# Patient Record
Sex: Female | Born: 1935 | Race: White | Hispanic: No | Marital: Married | Smoking: Never smoker
Health system: Southern US, Community
[De-identification: ages and names within clinical notes are randomized; demographics above are authoritative.]

## PROBLEM LIST (undated history)

## (undated) DIAGNOSIS — H409 Unspecified glaucoma: Secondary | ICD-10-CM

## (undated) DIAGNOSIS — Z923 Personal history of irradiation: Secondary | ICD-10-CM

## (undated) DIAGNOSIS — K429 Umbilical hernia without obstruction or gangrene: Secondary | ICD-10-CM

## (undated) DIAGNOSIS — E119 Type 2 diabetes mellitus without complications: Secondary | ICD-10-CM

## (undated) DIAGNOSIS — M549 Dorsalgia, unspecified: Secondary | ICD-10-CM

## (undated) DIAGNOSIS — G2581 Restless legs syndrome: Secondary | ICD-10-CM

## (undated) DIAGNOSIS — J343 Hypertrophy of nasal turbinates: Secondary | ICD-10-CM

## (undated) DIAGNOSIS — I471 Supraventricular tachycardia, unspecified: Secondary | ICD-10-CM

## (undated) DIAGNOSIS — R5383 Other fatigue: Secondary | ICD-10-CM

## (undated) DIAGNOSIS — F32A Depression, unspecified: Secondary | ICD-10-CM

## (undated) DIAGNOSIS — K219 Gastro-esophageal reflux disease without esophagitis: Secondary | ICD-10-CM

## (undated) DIAGNOSIS — M1990 Unspecified osteoarthritis, unspecified site: Secondary | ICD-10-CM

## (undated) DIAGNOSIS — E7800 Pure hypercholesterolemia, unspecified: Secondary | ICD-10-CM

## (undated) DIAGNOSIS — T8859XA Other complications of anesthesia, initial encounter: Secondary | ICD-10-CM

## (undated) DIAGNOSIS — I10 Essential (primary) hypertension: Secondary | ICD-10-CM

## (undated) DIAGNOSIS — G5600 Carpal tunnel syndrome, unspecified upper limb: Secondary | ICD-10-CM

## (undated) DIAGNOSIS — E039 Hypothyroidism, unspecified: Secondary | ICD-10-CM

## (undated) DIAGNOSIS — R569 Unspecified convulsions: Secondary | ICD-10-CM

## (undated) DIAGNOSIS — F419 Anxiety disorder, unspecified: Secondary | ICD-10-CM

## (undated) DIAGNOSIS — K5790 Diverticulosis of intestine, part unspecified, without perforation or abscess without bleeding: Secondary | ICD-10-CM

## (undated) DIAGNOSIS — I2510 Atherosclerotic heart disease of native coronary artery without angina pectoris: Secondary | ICD-10-CM

## (undated) DIAGNOSIS — C50919 Malignant neoplasm of unspecified site of unspecified female breast: Secondary | ICD-10-CM

## (undated) DIAGNOSIS — F329 Major depressive disorder, single episode, unspecified: Secondary | ICD-10-CM

## (undated) DIAGNOSIS — T4145XA Adverse effect of unspecified anesthetic, initial encounter: Secondary | ICD-10-CM

## (undated) DIAGNOSIS — Z87442 Personal history of urinary calculi: Secondary | ICD-10-CM

## (undated) HISTORY — PX: TONSILLECTOMY: SUR1361

## (undated) HISTORY — DX: Major depressive disorder, single episode, unspecified: F32.9

## (undated) HISTORY — DX: Other fatigue: R53.83

## (undated) HISTORY — PX: ANGIOPLASTY: SHX39

## (undated) HISTORY — PX: EYE SURGERY: SHX253

## (undated) HISTORY — DX: Malignant neoplasm of unspecified site of unspecified female breast: C50.919

## (undated) HISTORY — PX: CARPAL TUNNEL RELEASE: SHX101

## (undated) HISTORY — PX: MENISCUS REPAIR: SHX5179

## (undated) HISTORY — DX: Depression, unspecified: F32.A

## (undated) HISTORY — DX: Dorsalgia, unspecified: M54.9

## (undated) HISTORY — DX: Unspecified osteoarthritis, unspecified site: M19.90

## (undated) HISTORY — DX: Essential (primary) hypertension: I10

## (undated) HISTORY — PX: PARATHYROIDECTOMY: SHX19

## (undated) HISTORY — PX: ABDOMINAL HYSTERECTOMY: SHX81

## (undated) HISTORY — PX: HERNIA REPAIR: SHX51

## (undated) HISTORY — PX: COLONOSCOPY: SHX174

## (undated) HISTORY — DX: Anxiety disorder, unspecified: F41.9

---
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
## 2000-10-06 DIAGNOSIS — C50919 Malignant neoplasm of unspecified site of unspecified female breast: Secondary | ICD-10-CM | POA: Insufficient documentation

## 2000-10-06 HISTORY — DX: Malignant neoplasm of unspecified site of unspecified female breast: C50.919

## 2000-10-06 HISTORY — PX: BREAST LUMPECTOMY: SHX2

## 2003-04-23 ENCOUNTER — Emergency Department (HOSPITAL_COMMUNITY): Admission: EM | Admit: 2003-04-23 | Discharge: 2003-04-23 | Payer: Self-pay | Admitting: Emergency Medicine

## 2003-04-23 ENCOUNTER — Encounter: Payer: Self-pay | Admitting: Emergency Medicine

## 2003-05-09 ENCOUNTER — Encounter: Admission: RE | Admit: 2003-05-09 | Discharge: 2003-05-09 | Payer: Self-pay | Admitting: Urology

## 2003-05-09 ENCOUNTER — Encounter: Payer: Self-pay | Admitting: Urology

## 2003-05-10 ENCOUNTER — Encounter: Payer: Self-pay | Admitting: Urology

## 2003-05-10 ENCOUNTER — Ambulatory Visit (HOSPITAL_BASED_OUTPATIENT_CLINIC_OR_DEPARTMENT_OTHER): Admission: RE | Admit: 2003-05-10 | Discharge: 2003-05-10 | Payer: Self-pay | Admitting: Urology

## 2003-05-11 ENCOUNTER — Ambulatory Visit (HOSPITAL_BASED_OUTPATIENT_CLINIC_OR_DEPARTMENT_OTHER): Admission: RE | Admit: 2003-05-11 | Discharge: 2003-05-11 | Payer: Self-pay | Admitting: Urology

## 2003-07-18 ENCOUNTER — Encounter: Payer: Self-pay | Admitting: Endocrinology

## 2003-07-18 ENCOUNTER — Encounter: Admission: RE | Admit: 2003-07-18 | Discharge: 2003-07-18 | Payer: Self-pay | Admitting: Endocrinology

## 2003-08-02 ENCOUNTER — Ambulatory Visit (HOSPITAL_COMMUNITY): Admission: RE | Admit: 2003-08-02 | Discharge: 2003-08-02 | Payer: Self-pay | Admitting: Surgery

## 2003-09-04 ENCOUNTER — Encounter: Admission: RE | Admit: 2003-09-04 | Discharge: 2003-09-04 | Payer: Self-pay | Admitting: Endocrinology

## 2003-10-07 DIAGNOSIS — I2510 Atherosclerotic heart disease of native coronary artery without angina pectoris: Secondary | ICD-10-CM

## 2003-10-07 HISTORY — DX: Atherosclerotic heart disease of native coronary artery without angina pectoris: I25.10

## 2003-10-07 HISTORY — PX: CORONARY ANGIOPLASTY: SHX604

## 2003-11-13 ENCOUNTER — Ambulatory Visit (HOSPITAL_COMMUNITY): Admission: RE | Admit: 2003-11-13 | Discharge: 2003-11-14 | Payer: Self-pay | Admitting: Surgery

## 2003-11-13 ENCOUNTER — Encounter (INDEPENDENT_AMBULATORY_CARE_PROVIDER_SITE_OTHER): Payer: Self-pay | Admitting: *Deleted

## 2004-08-27 ENCOUNTER — Inpatient Hospital Stay (HOSPITAL_COMMUNITY): Admission: EM | Admit: 2004-08-27 | Discharge: 2004-08-29 | Payer: Self-pay | Admitting: Emergency Medicine

## 2004-09-18 ENCOUNTER — Encounter: Admission: RE | Admit: 2004-09-18 | Discharge: 2004-09-18 | Payer: Self-pay | Admitting: Internal Medicine

## 2004-10-07 ENCOUNTER — Encounter (HOSPITAL_COMMUNITY): Admission: RE | Admit: 2004-10-07 | Discharge: 2005-01-05 | Payer: Self-pay | Admitting: Cardiology

## 2004-11-06 ENCOUNTER — Ambulatory Visit (HOSPITAL_COMMUNITY): Admission: RE | Admit: 2004-11-06 | Discharge: 2004-11-06 | Payer: Self-pay | Admitting: Cardiology

## 2005-10-21 ENCOUNTER — Encounter: Admission: RE | Admit: 2005-10-21 | Discharge: 2005-10-21 | Payer: Self-pay | Admitting: Internal Medicine

## 2006-11-05 ENCOUNTER — Encounter: Admission: RE | Admit: 2006-11-05 | Discharge: 2006-11-05 | Payer: Self-pay | Admitting: Internal Medicine

## 2006-12-15 ENCOUNTER — Encounter: Admission: RE | Admit: 2006-12-15 | Discharge: 2006-12-15 | Payer: Self-pay | Admitting: Internal Medicine

## 2007-06-03 ENCOUNTER — Encounter: Admission: RE | Admit: 2007-06-03 | Discharge: 2007-06-03 | Payer: Self-pay | Admitting: Cardiology

## 2008-01-04 ENCOUNTER — Encounter: Admission: RE | Admit: 2008-01-04 | Discharge: 2008-01-04 | Payer: Self-pay | Admitting: Internal Medicine

## 2009-01-04 ENCOUNTER — Encounter: Admission: RE | Admit: 2009-01-04 | Discharge: 2009-01-04 | Payer: Self-pay | Admitting: Internal Medicine

## 2010-02-19 ENCOUNTER — Encounter: Admission: RE | Admit: 2010-02-19 | Discharge: 2010-02-19 | Payer: Self-pay | Admitting: Internal Medicine

## 2010-10-26 ENCOUNTER — Encounter: Payer: Self-pay | Admitting: Internal Medicine

## 2010-10-27 ENCOUNTER — Encounter: Payer: Self-pay | Admitting: Family Medicine

## 2010-11-12 ENCOUNTER — Other Ambulatory Visit: Payer: Self-pay | Admitting: Internal Medicine

## 2010-11-12 DIAGNOSIS — N6320 Unspecified lump in the left breast, unspecified quadrant: Secondary | ICD-10-CM

## 2010-11-12 DIAGNOSIS — Z1231 Encounter for screening mammogram for malignant neoplasm of breast: Secondary | ICD-10-CM

## 2010-11-14 ENCOUNTER — Ambulatory Visit
Admission: RE | Admit: 2010-11-14 | Discharge: 2010-11-14 | Disposition: A | Discharge status: Home / Self Care | Source: Ambulatory Visit | Attending: Internal Medicine | Admitting: Internal Medicine

## 2010-11-14 ENCOUNTER — Ambulatory Visit
Admission: RE | Admit: 2010-11-14 | Discharge: 2010-11-14 | Disposition: A | Discharge status: Home / Self Care | Payer: Medicare Other | Source: Ambulatory Visit | Attending: Internal Medicine | Admitting: Internal Medicine

## 2010-11-14 DIAGNOSIS — N6320 Unspecified lump in the left breast, unspecified quadrant: Secondary | ICD-10-CM

## 2010-12-11 ENCOUNTER — Other Ambulatory Visit: Payer: Self-pay | Admitting: Internal Medicine

## 2010-12-11 DIAGNOSIS — Z1231 Encounter for screening mammogram for malignant neoplasm of breast: Secondary | ICD-10-CM

## 2010-12-25 ENCOUNTER — Other Ambulatory Visit: Payer: Self-pay | Admitting: Dermatology

## 2011-02-21 ENCOUNTER — Ambulatory Visit: Payer: Self-pay

## 2011-02-21 ENCOUNTER — Ambulatory Visit

## 2011-02-21 ENCOUNTER — Ambulatory Visit
Admission: RE | Admit: 2011-02-21 | Discharge: 2011-02-21 | Disposition: A | Discharge status: Home / Self Care | Source: Ambulatory Visit | Attending: Internal Medicine | Admitting: Internal Medicine

## 2011-02-21 DIAGNOSIS — Z1231 Encounter for screening mammogram for malignant neoplasm of breast: Secondary | ICD-10-CM

## 2011-02-21 NOTE — Cardiovascular Report (Signed)
NAME:  DANAKA, LLERA NO.:  [id number]   MEDICAL RECORD NO.:  [id number]          PATIENT TYPE:  INP   LOCATION:  6525                         FACILITY:  [facility]   PHYSICIAN:  W. Ashley Royalty., M.D.DATE OF BIRTH:  1931-12-19   DATE OF PROCEDURE:  08/28/2004  DATE OF DISCHARGE:                              CARDIAC CATHETERIZATION   HISTORY:  A 77 year old female was admitted with chest pain consistent with  unstable angina pectoris.  She had a previous history of percutaneous  transluminal coronary angioplasty of the circumflex several years ago and  presented with increasing chest pain and shortness of breath.   PROCEDURE:  Elective left heart catheterization with coronary angiogram,  left ventriculogram, and stent of the proximal and distal left anterior  descending coronary artery.   DESCRIPTION OF PROCEDURE:  The patient was brought to the catheterization  laboratory and was prepped and draped in the usual manner.  After Xylocaine  anesthesia, she had a 6 French sheath placed in the right femoral artery  percutaneously and angiograms were made using 6 French catheters.  A 30 mL  ventriculogram was performed.  She was found to have single-vessel disease  involving the proximal and distal LAD.  The distal LAD was somewhat small.  Consideration was given toward whether referral for stenting or bypass  grafting, but with consultation with other colleagues, decision made to  proceed with stenting of the LAD because of single-vessel nature of disease  and also the intramyocardial lesion of the LAD distally.   The sheath was exchanged for a 7 French sheath and arrangements were made to  do angioplasty.  A second IV was started and IV nitroglycerin was  administered and she was begun on Angiomax with bolus and infusion.  Pepcid  20 mg IV was given.  Plavix 600 mg p.o. was administered.  She then had  angioplasty and stenting done with a 7 [place] JL-4 guiding  catheter, HGF-J  guide wire that crossed both lesions with only mild difficulty.  The  proximal lesion was dilated with 2.5 mm x 12 mm Maverick balloon.  Intracoronary nitroglycerin was then given.  The distal lesion was dilated  with a 2.0 x 8-mm Voyager balloon.  A decision was then made because of the  small size of the distal vessel to stent the distal vessel with 2.0 x 8-mm  Mini-Vision that was deployed with only mild difficulty.  Following the  deployment of the Mini-Vision, she was noted to have a dissection proximal  to the Mini-Vision at the edge.  This did not improve with nitroglycerin.  I  then attempted to pass a 2.5 x 8-mm Taxus stent, but after multiple attempts  this would not advance around the bend and the proximal edge of the stent.  The lesion had been dilated with 2.0 balloon.  At that point, I went in a  2.5-mm balloon and then went in a 2.5 x 8-mm Mini-Vision stent that was  dilated to 11 atmospheres with an excellent result and improvement of the  dissection.  Attention was then given to the proximal vessel which was  stented with a 2.5 x 20-mm Taxus drug-eluting stent inflated to 12  atmospheres.  This resulted in excellent improvement.  She had some nausea  and vomiting following the distal stent, then received anti-emetics and  tolerated this well.  Following this, post dilatation angiograms were  obtained.  Sheaths were sutured in place and she was returned to the  angioplasty care center in stable condition.  Integrilin was also begun  after the presence of the distal dissection.   HEMODYNAMIC DATA:  1.  Aorta postcontrast:  189/93.  2.  LV postcontrast:  189-17-27.   ANGIOGRAPHIC DATA:   LEFT VENTRICULOGRAM:  Performed in the 30 degree RAO projection.  The aortic  valve was normal.  The mitral valve was normal.  The left ventricle appears  normal in size.  Estimated ejection fraction is 65%.  Coronary arteries  arise and distribute normally.  There is  calcification noted in the proximal  left anterior descending and the distal left anterior descending coronary  artery.  Left main coronary artery is normal.   Left anterior descending:  Calcified 30% proximal narrowing.  There is then  a severe 90% stenosis in the mid portion of the vessel after a small  diagonal branch which is diffusely diseased.  There is segmental disease  following that.  There is then an intramyocardial segment.  Following this,  is an 80% stenosis prior to the distal vessel.  There is further segmental  60-70% disease distal to this.   Circumflex coronary artery has two marginal branches and contains only  scattered irregularities.  The previous percutaneous transluminal coronary  angioplasty site is presumed to be widely patent.   Right coronary artery is widely patent.  Post dilatation angiograms of the  left anterior descending show reduction in the proximal stenosis of 90% to  0% with no dissection plane.  The two stents in the previous distal vessel  are overlapping and are well apposed, and there is 0% residual stenosis.  There is some distal residual 60% stenosis in the apex which is segmental  that we opted to leave alone.   IMPRESSION:  1.  Successful stenting of the proximal left anterior descending with a drug-      eluting stent.  2.  Placement of two non-drug-eluting stents in the distal left anterior      descending.  3.  Minimal disease in the other vessels.  4.  Normal left ventricular function.   RECOMMENDATIONS:  She will need intensive risk factor modification as well  as control of hypertension.      Kristine Royal   WST/MEDQ  D:  08/28/2004  T:  08/28/2004  Job:  [id number]   cc:   Gaspar Garbe, M.D.  [address]  [place]  [place] [address]  Fax: [phone]

## 2011-02-21 NOTE — Discharge Summary (Signed)
[name], Roth            ACCOUNT NO.:  [id number]   MEDICAL RECORD NO.:  [id number]          PATIENT TYPE:  INP   LOCATION:  4705                         FACILITY:  [facility]   PHYSICIAN:  Darden Palmer., M.D.DATE OF BIRTH:  [birth year]   DATE OF ADMISSION:  08/27/2004  DATE OF DISCHARGE:  08/29/2004                                 DISCHARGE SUMMARY   FINAL DIAGNOSES:  1.  Subendocardial infarction -- initial episode.  2.  Coronary artery disease.      1.  Status post stenting of the proximal and distal left anterior          descending arteries.  3.  Idiopathic peripheral neuropathy.  4.  History of breast cancer.  5.  History of depression.  6.  Hypertension.  7.  Hyperlipidemia under treatment.   PROCEDURE:  Cardiac catheterization and stenting of the LAD.   HISTORY:  This 77 year old female has a prior history of coronary artery  disease with an angioplasty to the circumflex previously.  She had  parathyroidectomy one year ago and had a negative Cardiolite scan.  She has  had some chest discomfort previously, but did not wish to consider a  catheterization and later stopped taking Plavix.  While on a recent trip to  [place] she developed midsternal and upper epigastric discomfort in  her chest that occurred shortly before going to bed one night.  She had some  dyspnea associated with this and was quite uncomfortable, noticed this was  different than her previous angina, it was not suggestive of indigestion.  She returned home, had discomfort that started yesterday that was vague.  While vacuuming the day of admission she had the onset of similar upper  epigastric and midsternal discomfort associated with sweating as well as  dyspnea and stopped vacuuming.  Discomfort persisted somewhat and was seen  in the office.  EKG showed minor nonspecific changes and she was admitted to  rule out a myocardial infarction.  Please see the previously dictated  history  and physical for remainder of the details.   HOSPITAL COURSE:  On admission sodium is 135, potassium 4.2, renal function  was normal.  Troponin was negative, but MB was elevated at 4.8.  Lipid panel  showed a cholesterol of 204, triglycerides 177, HDL 63, LDL cholesterol 106.  TSH was 2.292.  Followup CPK-MBs were all negative.  EKG showed minor  nonspecific ST-T wave abnormality.   The patient was admitted and started on Lovenox.  She was taken to the  cardiac catheterization laboratory with findings of normal left ventricular  function and there was a calcified segmental stenosis proximal in the left  anterior descending artery estimated at 90%, there was a distal 80% stenosis  and a distal 60% stenosis.  She underwent stenting of the proximal stenosis  with a 2.5 x 20 millimeter Taxus drug-eluting stent and distally with a 2.0  Minivision stent because of the small size of the vessel.  She had a slight  proximal dissection to the distal stent and had a 2.5 millimeter Minivision  stent placed when a Taxus stent  would not negotiate the distal turn.  She  tolerated the procedure well and had Integrilin given postprocedure.  She  was stable and seen by Cardiac Rehab.  She was discharged in improved  condition, but her blood pressure was somewhat elevated and her lipids were  not at goal and she had additional medicines added to her regimen.   CURRENT MEDICATIONS:  1.  Plavix 75 mg daily.  2.  Aspirin 81 mg daily.  3.  Cymbalta 90 mg daily.  4.  Norvasc 5 mg b.i.d.  5.  Toprol XL 50 mg daily.  6.  Vytorin 10/40 mg daily.  7.  Lisinopril 20 mg daily.  8.  Protonix 40 mg daily.  9.  Nitroglycerin 1/150 sublingual as needed.   She is to be involved in a rehab program and I will plan to see her back in  followup in one week and she is to call if there are problems.      Wendy Roth   WST/MEDQ  D:  08/29/2004  T:  08/29/2004  Job:  [id number]

## 2011-02-21 NOTE — Op Note (Signed)
NAME:  Wendy Roth, Wendy Roth                      ACCOUNT NO.:  [id number]   MEDICAL RECORD NO.:  [id number]                   PATIENT TYPE:  OIB   LOCATION:  6709                                 FACILITY:  [facility]   PHYSICIAN:  Velora Heckler, M.D.                DATE OF BIRTH:  [birth year]   DATE OF PROCEDURE:  11/13/2003  DATE OF DISCHARGE:                                 OPERATIVE REPORT   PREOPERATIVE DIAGNOSIS:  Primary hyperparathyroidism.   POSTOPERATIVE DIAGNOSIS:  Primary hyperparathyroidism.   OPERATION PERFORMED:  Minimally invasive parathyroidectomy (right superior  gland).   SURGEON:  Velora Heckler, M.D.   ASSISTANT:  Currie Paris, M.D.   ANESTHESIA:  General.   ANESTHESIOLOGIST:  Judie Petit, M.D.   ESTIMATED BLOOD LOSS:  Minimal.   PREPARATION:  Betadine.   COMPLICATIONS:  None.   INDICATIONS FOR PROCEDURE:  The patient is a 77 year old white female from  [place] who presents with longstanding hypercalcemia.  The patient has  had complications including nephrolithiasis.  The patient's calcium recently  are in excess of eleven.  PTH level is in excess of 200.  Sestamibi scan  demonstrates a right-sided lesion consistent with adenoma.   DESCRIPTION OF PROCEDURE:  The procedure was done in operating room #1  at  the [facility]. [facility].  The patient was brought to the  operating room and placed in supine position on the operating room table.  Following administration of general anesthesia, the patient was positioned  and then prepped and draped in the usual strict aseptic fashion.  After  ascertaining that an adequate level of anesthesia had been obtained, the  neck was scanned with a NeoProbe.  A point in the right midneck was  identified with increased radioactivity.  A 3 cm incision was made with a  #15 blade.  Dissection was carried down through the subcutaneous tissues and  platysma.  Hemostasis was obtained with the  electrocautery.  External  jugular vein was ligated with 3-0 silk ties and divided.  Strap muscles were  incised in the midline and reflected laterally. Thyroid lobe was dissected  out and rolled medially.  Using the NeoProbe for guidance, the right  superior parathyroid gland was identified.  It was abnormal.  It was located  just behind the inferior thyroid artery which was carefully dissected off of  its surface.  Vascular tributaries were divided between small ligaclips.  Entire right superior gland was carefully dissected out and excised  completely.  Inferior gland on the right was identified and preserved as it  appears normal.  The right gland has approximately 90% of background counts  present ex vivo.  It was submitted to pathology where Dr. Kieth Brightly  confirmed parathyroid tissue consistent with parathyroid adenoma. Rescanning  of the neck showed only background levels present.  Good hemostasis was  obtained.  Surgicel was placed behind the right thyroid lobe.  Strap muscles  were reapproximated in the midline with interrupted 3-0 Vicryl sutures.  The  platysma was closed with interrupted 3-0 Vicryl sutures.  Skin edges were  anesthetized with local Marcaine anesthetic.  Skin edges were closed with a  running 4-0 Vicryl subcuticular suture.  The wound was washed and dried and  Steri-Strips were applied.  Sterile dressings were applied.  The patient was  awakened from anesthesia and brought to the recovery room in stable  condition.  The patient tolerated the procedure well.                                               Velora Heckler, M.D.    TMG/MEDQ  D:  11/13/2003  T:  11/14/2003  Job:  [id number]   cc:   Jeannett Senior A. Evlyn Kanner, M.D.  [address]  [place]  [place] [address]  Fax: [phone]   W. Ashley Royalty., M.D.  [address], [address]  [place]  [place] [address]  Fax: [phone]

## 2011-02-21 NOTE — Op Note (Signed)
   NAME:  Wendy Roth, Wendy Roth                      ACCOUNT NO.:  [id number]   MEDICAL RECORD NO.:  [id number]                   PATIENT TYPE:  AMB   LOCATION:  [facility]ESC                                 FACILITY:  [facility]   PHYSICIAN:  Rozanna Boer., M.D.      DATE OF BIRTH:  Sep 27, 1932   DATE OF PROCEDURE:  05/10/2003  DATE OF DISCHARGE:                                 OPERATIVE REPORT   PREOPERATIVE DIAGNOSIS:  Proximal left ureteral stone 7 x 3 mm.   POSTOPERATIVE DIAGNOSIS:  Proximal left ureteral stone 7 x 3 mm.   OPERATION:  Cysto, stone manipulation with retrograde and insertion of  ureteral stent.   ANESTHESIA:  General.   SURGEON:  Courtney Paris, M.D.   BRIEF HISTORY:  This 77 year old patient was found to have a left proximal  ureteral stone on April 21, 2003. There has been no significant movement  since then. She is admitted now for ureteroscopy, possible holmium laser and  removal of a stone if possible. No previous stones.   DESCRIPTION OF PROCEDURE:  The patient was placed on the operating table in  the dorsal lithotomy position and after satisfactory induction of general  anesthesia was prepped and draped with Betadine in the usual sterile  fashion. The bladder was carefully inspected using the 4 oblique lens. No  bladder mucosal lesions were seen. The left orifice was visualized and  catheterized with a 0.38 floppy-tip guidewire and under fluoroscopy the  guidewire was passed up to the stone where it was somewhat tight and in  manipulating the guidewire pass the stone the stone ended up being flushed  back up into the kidney. For that reason, I felt that it would be best not  to do ureteroscopy but rather insert a stent and do lithotripsy tomorrow.  Over the guidewire, I placed a 6 French x 26 cm length double J ureteral  stent and removed the guidewire. The stent was in good position. The  patient's bladder was drained, taken to the recovery area in  good condition  to be later discharged as an outpatient and will be scheduled for  lithotripsy tomorrow.                                               Rozanna Boer., M.D.    HMK/MEDQ  D:  05/10/2003  T:  05/10/2003  Job:  [id number]

## 2012-03-04 ENCOUNTER — Other Ambulatory Visit: Payer: Self-pay | Admitting: Internal Medicine

## 2012-03-04 DIAGNOSIS — Z1231 Encounter for screening mammogram for malignant neoplasm of breast: Secondary | ICD-10-CM

## 2012-04-01 ENCOUNTER — Ambulatory Visit
Admission: RE | Admit: 2012-04-01 | Discharge: 2012-04-01 | Disposition: A | Discharge status: Home / Self Care | Payer: Medicare Other | Source: Ambulatory Visit | Attending: Internal Medicine | Admitting: Internal Medicine

## 2012-04-01 DIAGNOSIS — Z1231 Encounter for screening mammogram for malignant neoplasm of breast: Secondary | ICD-10-CM

## 2012-10-05 ENCOUNTER — Other Ambulatory Visit: Payer: Self-pay | Admitting: Internal Medicine

## 2012-10-05 DIAGNOSIS — N63 Unspecified lump in unspecified breast: Secondary | ICD-10-CM

## 2012-10-06 HISTORY — PX: BREAST LUMPECTOMY: SHX2

## 2012-10-12 ENCOUNTER — Other Ambulatory Visit: Payer: Self-pay | Admitting: Internal Medicine

## 2012-10-12 ENCOUNTER — Ambulatory Visit
Admission: RE | Admit: 2012-10-12 | Discharge: 2012-10-12 | Disposition: A | Discharge status: Home / Self Care | Payer: Medicare Other | Source: Ambulatory Visit | Attending: Internal Medicine | Admitting: Internal Medicine

## 2012-10-12 ENCOUNTER — Ambulatory Visit
Admission: RE | Admit: 2012-10-12 | Discharge: 2012-10-12 | Disposition: A | Discharge status: Home / Self Care | Payer: Medicare Other | Source: Ambulatory Visit

## 2012-10-12 DIAGNOSIS — N63 Unspecified lump in unspecified breast: Secondary | ICD-10-CM

## 2012-10-13 ENCOUNTER — Other Ambulatory Visit: Payer: Self-pay | Admitting: Internal Medicine

## 2012-10-13 DIAGNOSIS — N6489 Other specified disorders of breast: Secondary | ICD-10-CM

## 2012-10-13 DIAGNOSIS — C50911 Malignant neoplasm of unspecified site of right female breast: Secondary | ICD-10-CM

## 2012-10-18 ENCOUNTER — Ambulatory Visit
Admission: RE | Admit: 2012-10-18 | Discharge: 2012-10-18 | Disposition: A | Discharge status: Home / Self Care | Payer: Medicare Other | Source: Ambulatory Visit | Attending: Internal Medicine | Admitting: Internal Medicine

## 2012-10-18 ENCOUNTER — Telehealth: Payer: Self-pay | Admitting: *Deleted

## 2012-10-18 DIAGNOSIS — C50911 Malignant neoplasm of unspecified site of right female breast: Secondary | ICD-10-CM

## 2012-10-18 DIAGNOSIS — N6489 Other specified disorders of breast: Secondary | ICD-10-CM

## 2012-10-18 DIAGNOSIS — C50319 Malignant neoplasm of lower-inner quadrant of unspecified female breast: Secondary | ICD-10-CM

## 2012-10-18 DIAGNOSIS — C50312 Malignant neoplasm of lower-inner quadrant of left female breast: Secondary | ICD-10-CM

## 2012-10-18 HISTORY — DX: Malignant neoplasm of lower-inner quadrant of left female breast: C50.312

## 2012-10-18 MED ORDER — GADOBENATE DIMEGLUMINE 529 MG/ML IV SOLN
8.0000 mL | Freq: Once | INTRAVENOUS | Status: AC | PRN
  Administered 2012-10-18: 8 mL via INTRAVENOUS

## 2012-10-18 NOTE — Telephone Encounter (Signed)
Confirmed [facility] for 10/20/12 at 1230 .  Instructions and contact information given.

## 2012-10-18 NOTE — Telephone Encounter (Signed)
error 

## 2012-10-20 ENCOUNTER — Encounter: Payer: Self-pay | Admitting: *Deleted

## 2012-10-20 ENCOUNTER — Ambulatory Visit (HOSPITAL_BASED_OUTPATIENT_CLINIC_OR_DEPARTMENT_OTHER): Payer: Medicare Other | Admitting: General Surgery

## 2012-10-20 ENCOUNTER — Encounter (INDEPENDENT_AMBULATORY_CARE_PROVIDER_SITE_OTHER): Payer: Self-pay | Admitting: General Surgery

## 2012-10-20 ENCOUNTER — Ambulatory Visit (HOSPITAL_BASED_OUTPATIENT_CLINIC_OR_DEPARTMENT_OTHER): Payer: Medicare Other | Admitting: Oncology

## 2012-10-20 ENCOUNTER — Encounter: Payer: Self-pay | Admitting: Oncology

## 2012-10-20 ENCOUNTER — Other Ambulatory Visit (HOSPITAL_BASED_OUTPATIENT_CLINIC_OR_DEPARTMENT_OTHER): Payer: Medicare Other | Admitting: Lab

## 2012-10-20 ENCOUNTER — Ambulatory Visit: Payer: Medicare Other

## 2012-10-20 ENCOUNTER — Ambulatory Visit
Admission: RE | Admit: 2012-10-20 | Discharge: 2012-10-20 | Disposition: A | Discharge status: Home / Self Care | Payer: Medicare Other | Source: Ambulatory Visit | Attending: Radiation Oncology | Admitting: Radiation Oncology

## 2012-10-20 ENCOUNTER — Ambulatory Visit: Payer: Medicare Other | Attending: General Surgery | Admitting: Physical Therapy

## 2012-10-20 VITALS — BP 162/72 | HR 69 | Temp 97.7°F | Resp 20 | Ht 60.0 in | Wt 167.8 lb

## 2012-10-20 DIAGNOSIS — Z87898 Personal history of other specified conditions: Secondary | ICD-10-CM

## 2012-10-20 DIAGNOSIS — C50319 Malignant neoplasm of lower-inner quadrant of unspecified female breast: Secondary | ICD-10-CM

## 2012-10-20 DIAGNOSIS — Z170 Estrogen receptor positive status [ER+]: Secondary | ICD-10-CM

## 2012-10-20 DIAGNOSIS — IMO0001 Reserved for inherently not codable concepts without codable children: Secondary | ICD-10-CM | POA: Insufficient documentation

## 2012-10-20 DIAGNOSIS — C50919 Malignant neoplasm of unspecified site of unspecified female breast: Secondary | ICD-10-CM | POA: Insufficient documentation

## 2012-10-20 DIAGNOSIS — R293 Abnormal posture: Secondary | ICD-10-CM | POA: Insufficient documentation

## 2012-10-20 LAB — COMPREHENSIVE METABOLIC PANEL (CC13)
AST: 18 U/L (ref 5–34)
BUN: 18 mg/dL (ref 7.0–26.0)
Calcium: 9.3 mg/dL (ref 8.4–10.4)
Chloride: 104 mEq/L (ref 98–107)
Creatinine: 1.1 mg/dL (ref 0.6–1.1)
Glucose: 103 mg/dl — ABNORMAL HIGH (ref 70–99)

## 2012-10-20 LAB — CBC WITH DIFFERENTIAL/PLATELET
Basophils Absolute: 0.1 10*3/uL (ref 0.0–0.1)
EOS%: 3 % (ref 0.0–7.0)
HCT: 42.9 % (ref 34.8–46.6)
HGB: 14.7 g/dL (ref 11.6–15.9)
MCH: 30.8 pg (ref 25.1–34.0)
MCV: 89.9 fL (ref 79.5–101.0)
MONO%: 8 % (ref 0.0–14.0)
NEUT%: 50.3 % (ref 38.4–76.8)
lymph#: 2.5 10*3/uL (ref 0.9–3.3)

## 2012-10-20 MED ORDER — LETROZOLE 2.5 MG PO TABS: 2.5000 mg | ORAL_TABLET | Freq: Every day | ORAL | Status: DC

## 2012-10-20 NOTE — Progress Notes (Signed)
Radiation Oncology         [phone] ________________________________  Initial outpatient Consultation  Name: Wendy Roth MRN: [id number]  Date: 10/20/2012  DOB: 14-Jul-1932   REFERRING PHYSICIAN: Emelia Loron, MD  DIAGNOSIS: cT2N0M0 right breast IDC. Low grade, ER/PR + Her2 (-).  HISTORY OF PRESENT ILLNESS::Wendy Roth is a 79 y.o. female who has a prior history of Left Breast DCIS treated in 2002 in [place] with lumpectomy and Radiation.  No Tamoxifen.  She had a right breast biopsy that was Positive for ADH previously but no excision was done.  Recently she palpated a mass in the right breast.  This was months after a BIRADS 1 mammogram from July 2013.  Another mammogram was done on 10-12-12 demonstrating a 5:00 mass.  Ultrasound was performed, showing an ill-defined hypoechoic area at 5 o'clock 6 cm from the right nipple measuring 1.6 x 1.2 x 1.2 cm. There was overlying skin thickening. Sonography of the right axilla  demonstrates normal lymph nodes.   MRI on 10-18-12  demonstrates a 4.1 cm mass in the Right breast and a 1.4cm lymph node with some effacement of the fatty hilum.  Left breast is negative for signs of malignancy.  She is otherwise in her usual state of health. She does have anxiety and depression and has scheduled therapy with a psychiatrist.       PREVIOUS RADIATION THERAPY: Yes approximately "33 treatments" in [place] to the right breast circa 2002 for DCIS  PAST MEDICAL HISTORY:  has a past medical history of Breast cancer; Hypertension; Arthritis; Back pain; Fatigue; Anxiety; and Depression.    PAST SURGICAL HISTORY: Past Surgical History  Procedure Date  . Tonsillectomy   . Abdominal hysterectomy   . Meniscus repair     left  . Angioplasty     x2  . Carpal tunnel release     bilateral  . Parathyroidectomy   . Breast lumpectomy     left x2    FAMILY HISTORY: family history includes Breast cancer in her maternal aunt.  SOCIAL  HISTORY:  reports that she has never smoked. She does not have any smokeless tobacco history on file. She reports that she drinks alcohol. She reports that she does not use illicit drugs.  ALLERGIES: Review of patient's allergies indicates no known allergies.  MEDICATIONS:  No current outpatient prescriptions on file.    REVIEW OF SYSTEMS:as above  PHYSICAL EXAM:  162/72; 69 pulse; 20 RR; 76 kg General: Alert and oriented, in no acute distress HEENT: Head is normocephalic. Pupils are equally round and reactive to light. Extraocular movements are intact. Oropharynx is clear. Neck: Neck is supple, no palpable cervical or supraclavicular lymphadenopathy. Heart: Regular in rate and rhythm with no murmurs, rubs, or gallops. Chest: Clear to auscultation bilaterally, with no rhonchi, wheezes, or rales. Abdomen: Soft, nontender, nondistended, with no rigidity or guarding. Extremities: No cyanosis or edema. Lymphatics: No concerning lymphadenopathy. Skin: No concerning lesions. Musculoskeletal: symmetric strength and muscle tone throughout. Neurologic: Cranial nerves II through XII are grossly intact. No obvious focalities. Speech is fluent. Coordination is intact. Psychiatric: Judgment and insight are intact. Affect is appropriate. Breasts: right breast is notable for a 5:00 lesion about 3cm clinically with overlying ecchymoses.  No palpable axillary nodes bilaterally. Left breast unremarkable.    LABORATORY DATA:  Lab Results  Component Value Date   WBC 6.7 10/20/2012   HGB 14.7 10/20/2012   HCT 42.9 10/20/2012   MCV 89.9 10/20/2012  PLT 259 10/20/2012   CMP     Component Value Date/Time   NA 141 10/20/2012 1248   K 4.5 10/20/2012 1248   CL 104 10/20/2012 1248   CO2 27 10/20/2012 1248   GLUCOSE 103* 10/20/2012 1248   BUN 18.0 10/20/2012 1248   CREATININE 1.1 10/20/2012 1248   CALCIUM 9.3 10/20/2012 1248   PROT 6.6 10/20/2012 1248   ALBUMIN 3.5 10/20/2012 1248   AST 18 10/20/2012 1248    ALT 18 10/20/2012 1248   ALKPHOS 52 10/20/2012 1248   BILITOT 0.76 10/20/2012 1248         RADIOGRAPHY: US Breast Right  10/12/2012  *RADIOLOGY REPORT*  Clinical Data:  The patient has noted a palpable mass in the right lower inner quadrant with overlying skin changes.  She underwent left lumpectomy and radiation therapy for ductal carcinoma in situ in 2002 in [place].  She also had a stereotactic biopsy of the right breast in 2002 in [place] demonstrating atypical ductal hyperplasia; no surgical excision was done on the right.  DIGITAL DIAGNOSTIC RIGHT MAMMOGRAM WITH CAD AND RIGHT BREAST ULTRASOUND:  Comparison:  04/01/2012, 02/21/2011, 02/19/2010, 01/04/2009, 01/04/2008  Findings:  ACR Breast Density Category 2: There are scattered fibroglandular densities.  There is a vague ill-defined density in the right lower inner quadrant which corresponds to the palpable finding.  A spot tangential view of this area demonstrates skin retraction.  There may be some fine microcalcifications associated with this area.  Mammographic images were processed with CAD.  On physical exam, I palpate a 2 cm mass at 5 o'clock 6 cm from the right nipple.  Ultrasound is performed, showing an ill-defined hypoechoic area at 5 o'clock 6 cm from the right nipple measuring  1.6 x 1.2 x 1.2 cm. There is overlying skin thickening.  Sonography of the right axilla demonstrates normal lymph nodes.  The appearance is suspicious for invasive mammary carcinoma, possibly invasive lobular carcinoma.  Biopsy is recommended. Ultrasound-guided core needle biopsy was discussed with the patient and her husband.  They agreed with this plan.  IMPRESSION: Suspicious ill-defined density in the right lower inner quadrant corresponding to the palpable finding.  RECOMMENDATION: Ultrasound-guided core needle biopsy is suggested.  This will be performed and reported separately.  I have discussed the findings and recommendations with the patient. Results  were also provided in writing at the conclusion of the visit.  BI-RADS CATEGORY 4:  Suspicious abnormality - biopsy should be considered.   Original Report Authenticated By: Cain Saupe, M.D.    Mr Breast Bilateral W Wo Contrast  10/18/2012  *RADIOLOGY REPORT*  Clinical Data: Recently diagnosed right breast invasive ductal carcinoma and ductal carcinoma in situ.  Previous right breast stereotactic guided core needle biopsy in 2002 in [place] demonstrating atypical ductal hyperplasia without subsequent surgical excision.  Status post left lumpectomy and radiation therapy for ductal carcinoma in situ in 2002 in [place].  BUN and creatinine were obtained on site at [facility] at [address] Results:  BUN 19 mg/dL,  Creatinine 1.3 mg/dL.  BILATERAL BREAST MRI WITH AND WITHOUT CONTRAST  Technique: Multiplanar, multisequence MR images of both breasts were obtained prior to and following the intravenous administration of 8ml of MultiHance.  Three dimensional images were evaluated at the independent DynaCad workstation.  Comparison:  Previous examinations, including the recent mammogram, ultrasound and biopsy examinations.  Findings: Mild background nodular parenchymal enhancement in the right breast and mild background parenchymal enhancement without nodularity in the left breast.  Post lumpectomy changes on the left.  4.1 x 2.4 x 1.5 cm irregularly shaped enhancing mass in the posterior aspect of the inferior right breast, slightly laterally. This contains a biopsy marker clip artifact and has a mixture of plateau and persistent enhancement kinetics.  No additional masses or areas of enhancement suspicious for malignancy in either breast.  Mildly prominent right axillary lymph nodes.  The largest is inferiorly and anteriorly located at the level of the nipple, measuring 1.4 x 0.6 cm on image number 72 of the 5-minute postcontrast images.  There is a suggestion of some effacement of the fatty  hilum.  There are no abnormal appearing internal mammary or left axillary lymph nodes.  IMPRESSION:  1.  4.1 x 2.4 x 1.5 cm biopsy-proven invasive ductal carcinoma and ductal carcinoma in situ in the inferior right breast, slightly laterally. 2.  Mildly prominent right axillary lymph nodes, the largest measuring 1.4 cm in maximum diameter.  Since there were no abnormal appearing axillary lymph nodes at recent ultrasound, this can be assessed with sentinel lymph node biopsy or axillary lymph node dissection at the time of surgery unless otherwise clinically indicated. 3.  No evidence of malignancy on the left, status post lumpectomy.  RECOMMENDATION: Treatment plan  THREE-DIMENSIONAL MR IMAGE RENDERING ON INDEPENDENT WORKSTATION:  Three-dimensional MR images were rendered by post-processing of the original MR data on an independent workstation.  The three- dimensional MR images were interpreted, and findings were reported in the accompanying complete MRI report for this study.  BI-RADS CATEGORY 6:  Known biopsy-proven malignancy - appropriate action should be taken.   Original Report Authenticated By: Beckie Salts, M.D.    Mm Digital Diagnostic Unilat L  10/18/2012   *RADIOLOGY REPORT*  Clinical Data:  History of left breast cancer in 2002.  Status post lumpectomy and radiation therapy.  History of stereotactic biopsy of the right breast in 2002 demonstrating atypical ductal hyperplasia.  Recently diagnosed right breast cancer. Pre MRI.  DIGITAL DIAGNOSTIC LEFT MAMMOGRAM WITH CAD  Comparison: None.  Findings:  ACR Breast Density Category 3: The breast tissue is heterogeneously dense.  Lumpectomy changes are seen in the left breast.  There is no new suspicious mass or malignant-type microcalcifications.  Mammographic images were processed with CAD.  IMPRESSION: No evidence of malignancy in the left breast.  RECOMMENDATION: Treatment planning of the right breast is recommended.  I have discussed the findings and  recommendations with the patient. Results were also provided in writing at the conclusion of the visit.  BI-RADS CATEGORY 2:  Benign finding(s).   Original Report Authenticated By: Baird Lyons, M.D.    Mm Digital Diagnostic Unilat R  10/12/2012  *RADIOLOGY REPORT*  Clinical Data:  Ultrasound-guided core needle biopsy of a vague hypoechoic density at 5 o'clock 6 cm from the right nipple with clip placement.  DIGITAL DIAGNOSTIC RIGHT MAMMOGRAM  Comparison:  Previous exams.  Findings:  Films are performed following ultrasound guided biopsy of a vague density at 5 o'clock 6 cm from the right nipple.  The ribbon clip is appropriately positioned within the area of density.  IMPRESSION: Appropriate clip placement following ultrasound-guided core needle biopsy of a vague density at 5 o'clock 6 cm from the right nipple.   Original Report Authenticated By: Cain Saupe, M.D.    Mm Digital Diagnostic Unilat R  10/12/2012  *RADIOLOGY REPORT*  Clinical Data:  The patient has noted a palpable mass in the right lower inner quadrant with overlying skin changes.  She  underwent left lumpectomy and radiation therapy for ductal carcinoma in situ in 2002 in [place].  She also had a stereotactic biopsy of the right breast in 2002 in [place] demonstrating atypical ductal hyperplasia; no surgical excision was done on the right.  DIGITAL DIAGNOSTIC RIGHT MAMMOGRAM WITH CAD AND RIGHT BREAST ULTRASOUND:  Comparison:  04/01/2012, 02/21/2011, 02/19/2010, 01/04/2009, 01/04/2008  Findings:  ACR Breast Density Category 2: There are scattered fibroglandular densities.  There is a vague ill-defined density in the right lower inner quadrant which corresponds to the palpable finding.  A spot tangential view of this area demonstrates skin retraction.  There may be some fine microcalcifications associated with this area.  Mammographic images were processed with CAD.  On physical exam, I palpate a 2 cm mass at 5 o'clock 6 cm from the right  nipple.  Ultrasound is performed, showing an ill-defined hypoechoic area at 5 o'clock 6 cm from the right nipple measuring  1.6 x 1.2 x 1.2 cm. There is overlying skin thickening.  Sonography of the right axilla demonstrates normal lymph nodes.  The appearance is suspicious for invasive mammary carcinoma, possibly invasive lobular carcinoma.  Biopsy is recommended. Ultrasound-guided core needle biopsy was discussed with the patient and her husband.  They agreed with this plan.  IMPRESSION: Suspicious ill-defined density in the right lower inner quadrant corresponding to the palpable finding.  RECOMMENDATION: Ultrasound-guided core needle biopsy is suggested.  This will be performed and reported separately.  I have discussed the findings and recommendations with the patient. Results were also provided in writing at the conclusion of the visit.  BI-RADS CATEGORY 4:  Suspicious abnormality - biopsy should be considered.   Original Report Authenticated By: Cain Saupe, M.D.    US Breast Bx W Loc Dev 1st Lesion Img Bx Spec US Guide  10/13/2012  *RADIOLOGY REPORT*  Clinical Data:  Palpable mass in the right lower inner quadrant with a vague density on the ultrasound and mammogram.  ULTRASOUND GUIDED VACUUM ASSISTED CORE BIOPSY OF THE RIGHT BREAST  The patient and I discussed the procedure of ultrasound-guided biopsy, including benefits and alternatives.  We discussed the high likelihood of a successful procedure. We discussed the risks of the procedure including infection, bleeding, tissue injury, clip migration, and inadequate sampling.  Informed written consent was given.  Using sterile technique, 2% lidocaine, ultrasound guidance, and a 12 gauge vacuum assisted needle, biopsy was performed of the vague density at 5 o'clock 6 cm from the right nipple using a mediolateral approach.  At the conclusion of the procedure, a ribbon tissue marker clip was deployed into the biopsy cavity. Follow-up 2-view mammogram was  performed and dictated separately.  Histologic evaluation demonstrates invasive ductal carcinoma and ductal carcinoma in situ, likely grade I.  This is concordant with the imaging findings.  Results were discussed with the patient and her husband by telephone at her request.  She reports no complications from the procedure.  The patient is scheduled to be evaluated in the [facility] on 10/20/2012.  She is scheduled for breast MRI on 10/18/2012.  IMPRESSION: Ultrasound-guided biopsy of a vague density at 5 o'clock 6 cm from the right nipple. Invasive ductal carcinoma and ductal carcinoma in situ is diagnosed.  The patient is scheduled for breast MRI and [facility].  No apparent complications.   Original Report Authenticated By: Cain Saupe, M.D.       IMPRESSION/PLAN: 79 year old woman with cT2N0 right breast cancer, prior  DCIS treated in the left breast.  She will talk to med/onc and surgery about possible anti-estrogen therapy and lumpectomy /SLN bx.  If she preserves her breast, she will need radiotherapy to that side. I will appreciate seeing her postoperatively to discuss treatment planning.   She is also considering mastectomy.  If she has a mastectomy, indications for RT would be positive margins or positive node(s).   I have encouraged her to call if she has any issues or concerns in the future. I wished her the very best.  I spent 30 minutes minutes face to face with the patient and more than 50% of that time was spent in counseling and/or coordination of care.    __________________________________________   Lonie Peak, MD

## 2012-10-20 NOTE — Progress Notes (Signed)
SHARECE FLEISCHHACKER [id number] Feb 05, 1932 79 y.o. 10/20/2012 4:18 PM  CC Dr. Renford Dills  REASON FOR CONSULTATION:  79 year old female who self palpated a mass in her right breast. 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 Patient was seen in the Multidisciplinary Breast Clinic for discussion of her treatment options.   STAGE:   Cancer of lower-inner quadrant of female breast   Primary site: Breast (Right)   Staging method: AJCC 7th Edition   Clinical: Stage IIA (T2, N0, cM0)   Summary: Stage IIA (T2, N0, cM0)  REFERRING PHYSICIAN: Dr. Emelia Loron  HISTORY OF PRESENT ILLNESS:  PEARLEY MILLINGTON is a 79 y.o. female.  With medical history significant for hypertension arthritis fatigue anxiety and depression. Recently on self breast examination patient found a mass in the right breast. Because of this she underwent a mammogram ultrasound and eventually a biopsy. The biopsy showed a grade 1 invasive ductal carcinoma with DCIS. The tumor was ER +100% PR +100% HER-2/neu negative with a proliferation marker Ki-67 low at 15%. Patient went on to then have MRI of the breasts performed that showed a 4.1 cm area of enhancement. No other abnormalities were seen. Patient is now seen in the multidisciplinary breast clinic for discussion of treatment options. Her case was discussed in the multidisciplinary breast conference. All of her pathology and radiology were reviewed. She is without any acute or new complaints.   Past Medical History: Past Medical History  Diagnosis Date  . Breast cancer   . Hypertension   . Arthritis   . Back pain   . Fatigue   . Anxiety   . Depression     Past Surgical History: Past Surgical History  Procedure Date  . Tonsillectomy   . Abdominal hysterectomy   . Meniscus repair     left  . Angioplasty     x2  . Carpal tunnel release     bilateral  . Parathyroidectomy     . Breast lumpectomy     left x2    Family History: Family History  Problem Relation Age of Onset  . Breast cancer Maternal Aunt     Social History History  Substance Use Topics  . Smoking status: Never Smoker   . Smokeless tobacco: Not on file  . Alcohol Use: Yes    Allergies: No Known Allergies  Current Medications: Current Outpatient Prescriptions  Medication Sig Dispense Refill  . amLODipine (NORVASC) 5 MG tablet Take 5 mg by mouth daily.      Marland Kitchen aspirin 81 MG tablet Take 81 mg by mouth daily.      . clopidogrel (PLAVIX) 75 MG tablet Take 75 mg by mouth daily.      . DULoxetine (CYMBALTA) 30 MG capsule Take 30 mg by mouth daily.      Marland Kitchen ezetimibe-simvastatin (VYTORIN) 10-20 MG per tablet Take 1 tablet by mouth at bedtime.      Marland Kitchen levothyroxine (SYNTHROID, LEVOTHROID) 25 MCG tablet Take 25 mcg by mouth daily.      Marland Kitchen lisinopril (PRINIVIL,ZESTRIL) 20 MG tablet Take 20 mg by mouth daily.      . metoprolol succinate (TOPROL-XL) 50 MG 24 hr tablet Take 50 mg by mouth daily. Take with or immediately following a meal.      . temazepam (RESTORIL) 15 MG capsule Take 15 mg by mouth at bedtime as needed.        OB/GYN History:patient had menarche  at age 26 she underwent menopause in 66 do to her hysterectomy. She has been on hormone replacement therapy for about 15 years she discontinued them and 2000 to do to diagnosis of breast cancer. Patient has had 2 live births first live birth was at 56.  Fertility Discussion: not applicable Prior History of Cancer: patient does have a prior history of left breast DCIS treated in 2002 in [place] with lumpectomy and radiation therapy but no tamoxifen. She has  also has had right breast biopsies that were positive for ADH previously but no excision was done.  Health Maintenance:  Colonoscopy yes Bone Density yes Last PAP smear 2004  ECOG PERFORMANCE STATUS: 1 - Symptomatic but completely ambulatory  Genetic Counseling/testing: with  prior history of breast cancer as well as ADH patient will be referred to genetic counseling or  REVIEW OF SYSTEMS:  A comprehensive review of systems was negative.  PHYSICAL EXAMINATION: Blood pressure 162/72, pulse 69, temperature 97.7 F (36.5 C), resp. rate 20, height 5' (1.524 m), weight 167 lb 12.8 oz (76.114 kg).  WUJ:WJXBJ, healthy, no distress, well nourished and well developed SKIN: skin color, texture, turgor are normal HEAD: Normocephalic EYES: PERRLA, EOMI EARS: External ears normal OROPHARYNX:no exudate, no erythema and lips, buccal mucosa, and tongue normal  NECK: supple, no adenopathy LYMPH:  no palpable lymphadenopathy BREAST:left breast normal without mass, skin or nipple changes or axillary nodes, right breast reveals a palpable mass without any other dominant masses no nipple discharge no skin changes LUNGS: clear to auscultation and percussion HEART: regular rate & rhythm ABDOMEN:abdomen soft, non-tender, obese, normal bowel sounds and no masses or organomegaly BACK: No CVA tenderness EXTREMITIES:no edema, no clubbing, no cyanosis  NEURO: alert & oriented x 3 with fluent speech, no focal motor/sensory deficits, gait normal, reflexes normal and symmetric     STUDIES/RESULTS: US Breast Right  10/12/2012  *RADIOLOGY REPORT*  Clinical Data:  The patient has noted a palpable mass in the right lower inner quadrant with overlying skin changes.  She underwent left lumpectomy and radiation therapy for ductal carcinoma in situ in 2002 in [place].  She also had a stereotactic biopsy of the right breast in 2002 in [place] demonstrating atypical ductal hyperplasia; no surgical excision was done on the right.  DIGITAL DIAGNOSTIC RIGHT MAMMOGRAM WITH CAD AND RIGHT BREAST ULTRASOUND:  Comparison:  04/01/2012, 02/21/2011, 02/19/2010, 01/04/2009, 01/04/2008  Findings:  ACR Breast Density Category 2: There are scattered fibroglandular densities.  There is a vague ill-defined  density in the right lower inner quadrant which corresponds to the palpable finding.  A spot tangential view of this area demonstrates skin retraction.  There may be some fine microcalcifications associated with this area.  Mammographic images were processed with CAD.  On physical exam, I palpate a 2 cm mass at 5 o'clock 6 cm from the right nipple.  Ultrasound is performed, showing an ill-defined hypoechoic area at 5 o'clock 6 cm from the right nipple measuring  1.6 x 1.2 x 1.2 cm. There is overlying skin thickening.  Sonography of the right axilla demonstrates normal lymph nodes.  The appearance is suspicious for invasive mammary carcinoma, possibly invasive lobular carcinoma.  Biopsy is recommended. Ultrasound-guided core needle biopsy was discussed with the patient and her husband.  They agreed with this plan.  IMPRESSION: Suspicious ill-defined density in the right lower inner quadrant corresponding to the palpable finding.  RECOMMENDATION: Ultrasound-guided core needle biopsy is suggested.  This will be performed and reported separately.  I  have discussed the findings and recommendations with the patient. Results were also provided in writing at the conclusion of the visit.  BI-RADS CATEGORY 4:  Suspicious abnormality - biopsy should be considered.   Original Report Authenticated By: Cain Saupe, M.D.    Mr Breast Bilateral W Wo Contrast  10/18/2012  *RADIOLOGY REPORT*  Clinical Data: Recently diagnosed right breast invasive ductal carcinoma and ductal carcinoma in situ.  Previous right breast stereotactic guided core needle biopsy in 2002 in [place] demonstrating atypical ductal hyperplasia without subsequent surgical excision.  Status post left lumpectomy and radiation therapy for ductal carcinoma in situ in 2002 in [place].  BUN and creatinine were obtained on site at [facility] at [address] Results:  BUN 19 mg/dL,  Creatinine 1.3 mg/dL.  BILATERAL BREAST MRI WITH AND WITHOUT  CONTRAST  Technique: Multiplanar, multisequence MR images of both breasts were obtained prior to and following the intravenous administration of 8ml of MultiHance.  Three dimensional images were evaluated at the independent DynaCad workstation.  Comparison:  Previous examinations, including the recent mammogram, ultrasound and biopsy examinations.  Findings: Mild background nodular parenchymal enhancement in the right breast and mild background parenchymal enhancement without nodularity in the left breast.  Post lumpectomy changes on the left.  4.1 x 2.4 x 1.5 cm irregularly shaped enhancing mass in the posterior aspect of the inferior right breast, slightly laterally. This contains a biopsy marker clip artifact and has a mixture of plateau and persistent enhancement kinetics.  No additional masses or areas of enhancement suspicious for malignancy in either breast.  Mildly prominent right axillary lymph nodes.  The largest is inferiorly and anteriorly located at the level of the nipple, measuring 1.4 x 0.6 cm on image number 72 of the 5-minute postcontrast images.  There is a suggestion of some effacement of the fatty hilum.  There are no abnormal appearing internal mammary or left axillary lymph nodes.  IMPRESSION:  1.  4.1 x 2.4 x 1.5 cm biopsy-proven invasive ductal carcinoma and ductal carcinoma in situ in the inferior right breast, slightly laterally. 2.  Mildly prominent right axillary lymph nodes, the largest measuring 1.4 cm in maximum diameter.  Since there were no abnormal appearing axillary lymph nodes at recent ultrasound, this can be assessed with sentinel lymph node biopsy or axillary lymph node dissection at the time of surgery unless otherwise clinically indicated. 3.  No evidence of malignancy on the left, status post lumpectomy.  RECOMMENDATION: Treatment plan  THREE-DIMENSIONAL MR IMAGE RENDERING ON INDEPENDENT WORKSTATION:  Three-dimensional MR images were rendered by post-processing of the  original MR data on an independent workstation.  The three- dimensional MR images were interpreted, and findings were reported in the accompanying complete MRI report for this study.  BI-RADS CATEGORY 6:  Known biopsy-proven malignancy - appropriate action should be taken.   Original Report Authenticated By: Beckie Salts, M.D.    Mm Digital Diagnostic Unilat L  10/18/2012   *RADIOLOGY REPORT*  Clinical Data:  History of left breast cancer in 2002.  Status post lumpectomy and radiation therapy.  History of stereotactic biopsy of the right breast in 2002 demonstrating atypical ductal hyperplasia.  Recently diagnosed right breast cancer. Pre MRI.  DIGITAL DIAGNOSTIC LEFT MAMMOGRAM WITH CAD  Comparison: None.  Findings:  ACR Breast Density Category 3: The breast tissue is heterogeneously dense.  Lumpectomy changes are seen in the left breast.  There is no new suspicious mass or malignant-type microcalcifications.  Mammographic images were processed with  CAD.  IMPRESSION: No evidence of malignancy in the left breast.  RECOMMENDATION: Treatment planning of the right breast is recommended.  I have discussed the findings and recommendations with the patient. Results were also provided in writing at the conclusion of the visit.  BI-RADS CATEGORY 2:  Benign finding(s).   Original Report Authenticated By: Baird Lyons, M.D.    Mm Digital Diagnostic Unilat R  10/12/2012  *RADIOLOGY REPORT*  Clinical Data:  Ultrasound-guided core needle biopsy of a vague hypoechoic density at 5 o'clock 6 cm from the right nipple with clip placement.  DIGITAL DIAGNOSTIC RIGHT MAMMOGRAM  Comparison:  Previous exams.  Findings:  Films are performed following ultrasound guided biopsy of a vague density at 5 o'clock 6 cm from the right nipple.  The ribbon clip is appropriately positioned within the area of density.  IMPRESSION: Appropriate clip placement following ultrasound-guided core needle biopsy of a vague density at 5 o'clock 6 cm from the  right nipple.   Original Report Authenticated By: Cain Saupe, M.D.    Mm Digital Diagnostic Unilat R  10/12/2012  *RADIOLOGY REPORT*  Clinical Data:  The patient has noted a palpable mass in the right lower inner quadrant with overlying skin changes.  She underwent left lumpectomy and radiation therapy for ductal carcinoma in situ in 2002 in [place].  She also had a stereotactic biopsy of the right breast in 2002 in [place] demonstrating atypical ductal hyperplasia; no surgical excision was done on the right.  DIGITAL DIAGNOSTIC RIGHT MAMMOGRAM WITH CAD AND RIGHT BREAST ULTRASOUND:  Comparison:  04/01/2012, 02/21/2011, 02/19/2010, 01/04/2009, 01/04/2008  Findings:  ACR Breast Density Category 2: There are scattered fibroglandular densities.  There is a vague ill-defined density in the right lower inner quadrant which corresponds to the palpable finding.  A spot tangential view of this area demonstrates skin retraction.  There may be some fine microcalcifications associated with this area.  Mammographic images were processed with CAD.  On physical exam, I palpate a 2 cm mass at 5 o'clock 6 cm from the right nipple.  Ultrasound is performed, showing an ill-defined hypoechoic area at 5 o'clock 6 cm from the right nipple measuring  1.6 x 1.2 x 1.2 cm. There is overlying skin thickening.  Sonography of the right axilla demonstrates normal lymph nodes.  The appearance is suspicious for invasive mammary carcinoma, possibly invasive lobular carcinoma.  Biopsy is recommended. Ultrasound-guided core needle biopsy was discussed with the patient and her husband.  They agreed with this plan.  IMPRESSION: Suspicious ill-defined density in the right lower inner quadrant corresponding to the palpable finding.  RECOMMENDATION: Ultrasound-guided core needle biopsy is suggested.  This will be performed and reported separately.  I have discussed the findings and recommendations with the patient. Results were also provided  in writing at the conclusion of the visit.  BI-RADS CATEGORY 4:  Suspicious abnormality - biopsy should be considered.   Original Report Authenticated By: Cain Saupe, M.D.    US Breast Bx W Loc Dev 1st Lesion Img Bx Spec US Guide  10/13/2012  *RADIOLOGY REPORT*  Clinical Data:  Palpable mass in the right lower inner quadrant with a vague density on the ultrasound and mammogram.  ULTRASOUND GUIDED VACUUM ASSISTED CORE BIOPSY OF THE RIGHT BREAST  The patient and I discussed the procedure of ultrasound-guided biopsy, including benefits and alternatives.  We discussed the high likelihood of a successful procedure. We discussed the risks of the procedure including infection, bleeding, tissue injury, clip migration, and inadequate  sampling.  Informed written consent was given.  Using sterile technique, 2% lidocaine, ultrasound guidance, and a 12 gauge vacuum assisted needle, biopsy was performed of the vague density at 5 o'clock 6 cm from the right nipple using a mediolateral approach.  At the conclusion of the procedure, a ribbon tissue marker clip was deployed into the biopsy cavity. Follow-up 2-view mammogram was performed and dictated separately.  Histologic evaluation demonstrates invasive ductal carcinoma and ductal carcinoma in situ, likely grade I.  This is concordant with the imaging findings.  Results were discussed with the patient and her husband by telephone at her request.  She reports no complications from the procedure.  The patient is scheduled to be evaluated in the [facility] on 10/20/2012.  She is scheduled for breast MRI on 10/18/2012.  IMPRESSION: Ultrasound-guided biopsy of a vague density at 5 o'clock 6 cm from the right nipple. Invasive ductal carcinoma and ductal carcinoma in situ is diagnosed.  The patient is scheduled for breast MRI and [facility].  No apparent complications.   Original Report Authenticated By:  Cain Saupe, M.D.      LABS:    Chemistry      Component Value Date/Time   NA 141 10/20/2012 1248   K 4.5 10/20/2012 1248   CL 104 10/20/2012 1248   CO2 27 10/20/2012 1248   BUN 18.0 10/20/2012 1248   CREATININE 1.1 10/20/2012 1248      Component Value Date/Time   CALCIUM 9.3 10/20/2012 1248   ALKPHOS 52 10/20/2012 1248   AST 18 10/20/2012 1248   ALT 18 10/20/2012 1248   BILITOT 0.76 10/20/2012 1248      Lab Results  Component Value Date   WBC 6.7 10/20/2012   HGB 14.7 10/20/2012   HCT 42.9 10/20/2012   MCV 89.9 10/20/2012   PLT 259 10/20/2012   PATHOLOGY: PROGNOSTIC INDICATORS - ACIS Results IMMUNOHISTOCHEMICAL AND MORPHOMETRIC ANALYSIS BY THE AUTOMATED CELLULAR IMAGING SYSTEM (ACIS) Estrogen Receptor (Negative, <1%): 100%, STRONG STAINING INTENSITY Progesterone Receptor (Negative, <1%): 100%, STRONG STAINING INTENSITY Proliferation Marker Ki67 by M IB-1 (Low<20%): 15% All controls stained appropriately Jimmy Picket MD Pathologist, Electronic Signature ( Signed 10/15/2012) CHROMOGENIC IN-SITU HYBRIDIZATION Interpretation HER-2/NEU BY CISH - NO AMPLIFICATION OF HER-2 DETECTED. THE RATIO OF HER-2: CEP 17 SIGNALS WAS 1.13. Reference range: Ratio: HER2:CEP17 < 1.8 - gene amplification not observed Ratio: HER2:CEP 17 1.8-2.2 - equivocal result Ratio: HER2:CEP17 > 2.2 - gene amplification observed Jimmy Picket MD Pathologist, Electronic Signature ( Signed 10/15/2012) 1 of 2 FINAL for SACOYA, MCGOURTY ([id number]) FINAL DIAGNOSIS Diagnosis Breast, right, needle core biopsy, 5 o'clock, 6 cm / nipple - INVASIVE DUCTAL CARCINOMA, SEE COMMENT. - DUCTAL CARCINOMA IN SITU. Microscopic Comment Although the grade of tumor is best assessed at resection, in these biopsies both in situ and invasive carcinoma are Grade I. Breast prognostic studies are pending and will be reported in an Addendum. The case was reviewed with Dr. Raynald Blend, who concurs. (CRR:caf 10/13/12) [place] RUND  DO Pathologist, Electronic Signature (Case ASSESSMENT    79 year old female with  #1 prior history of left breast carcinoma which was a DCIS treated with a lumpectomy and radiation therapy in 2002 but no known tamoxifen. She's also had a right breast biopsy that was positive for atypical ductal hyperplasia but no excision was done. Patient now with new diagnosis of invasive mammary carcinoma on ultrasound showing an ill-defined hypoechoic area at the 5:00 position 6 cm from the  right nipple measuring 1.6 x 1.2 x 1.2 cm width overlying skin thickening. Sonography did reveal normal appearing right axillary lymph nodes. MRI on 10/18/2012 demonstrated a 4.1 cm mass in the right breast and a 1.4 cm lymph node with effacement of the fatty hilum. The left breast was negative. Patient is seen in the multidisciplinary breast clinic for discussion of treatment options. Patient desires breast conservation.  #2 we discussed neoadjuvant approach with antiestrogen therapy. We discussed use of letrozole 2.5 mg on a daily basis for about 6-9 months. We discussed the rationale for this we discussed risks and benefits of that. I would plan on getting MRIs of the breast Center 3 months and 6 months to evaluate response to therapy. Patient and her husband understood this.  #3 once we have maximal response then she will proceed with surgery and Dr. Doreen Salvage office will contact her eventually to  Clinical Trial Eligibility: no  Multidisciplinary conference discussion yes    PLAN:    #1 proceed with neoadjuvant antiestrogen therapy with letrozole 2.5 mg on a daily basis. Prescription was sent to her pharmacy.  #2 patient will return in 3 months time I will do a physical examination and based on this we may get her scheduled for an MRI. I certainly will definitely be doing MRI at 6 months.  #3 she will be seen back in 3 months time for followup.       Discussion: Patient is being treated per NCCN breast  cancer care guidelines appropriate for stage.II   Thank you so much for allowing me to participate in the care of Jeralene Peters. I will continue to follow up the patient with you and assist in her care.  All questions were answered. The patient knows to call the clinic with any problems, questions or concerns. We can certainly see the patient much sooner if necessary.  I spent 55 minutes counseling the patient face to face. The total time spent in the appointment was 60 minutes.  Drue Second, MD Medical/Oncology [facility] [phone] (beeper) [phone] (Office)  10/20/2012, 4:18 PM

## 2012-10-20 NOTE — Patient Instructions (Addendum)
Proceed with neoadjuvant anti-estrogen therapy  Letrozole tablets What is this medicine? LETROZOLE (LET roe zole) blocks the production of estrogen. Certain types of breast cancer grow under the influence of estrogen. Letrozole helps block tumor growth. This medicine is used to treat advanced breast cancer in postmenopausal women. This medicine may be used for other purposes; ask your health care provider or pharmacist if you have questions. What should I tell my health care provider before I take this medicine? They need to know if you have any of these conditions: -liver disease -osteoporosis (weak bones) -an unusual or allergic reaction to letrozole, other medicines, foods, dyes, or preservatives -pregnant or trying to get pregnant -breast-feeding How should I use this medicine? Take this medicine by mouth with a glass of water. You may take it with or without food. Follow the directions on the prescription label. Take your medicine at regular intervals. Do not take your medicine more often than directed. Do not stop taking except on your doctor's advice. Talk to your pediatrician regarding the use of this medicine in children. Special care may be needed. Overdosage: If you think you have taken too much of this medicine contact a poison control center or emergency room at once. NOTE: This medicine is only for you. Do not share this medicine with others. What if I miss a dose? If you miss a dose, take it as soon as you can. If it is almost time for your next dose, take only that dose. Do not take double or extra doses. What may interact with this medicine? Do not take this medicine with any of the following medications: -estrogens, like hormone replacement therapy or birth control pills This medicine may also interact with the following medications: -dietary supplements such as androstenedione or DHEA -prasterone -tamoxifen This list may not describe all possible interactions. Give your  health care provider a list of all the medicines, herbs, non-prescription drugs, or dietary supplements you use. Also tell them if you smoke, drink alcohol, or use illegal drugs. Some items may interact with your medicine. What should I watch for while using this medicine? Visit your doctor or health care professional for regular check-ups to monitor your condition. Do not use this drug if you are pregnant. Serious side effects to an unborn child are possible. Talk to your doctor or pharmacist for more information. You may get drowsy or dizzy. Do not drive, use machinery, or do anything that needs mental alertness until you know how this medicine affects you. Do not stand or sit up quickly, especially if you are an older patient. This reduces the risk of dizzy or fainting spells. What side effects may I notice from receiving this medicine? Side effects that you should report to your doctor or health care professional as soon as possible: -allergic reactions like skin rash, itching, or hives -bone fracture -chest pain -difficulty breathing or shortness of breath -severe pain, swelling, warmth in the leg -unusually weak or tired -vaginal bleeding Side effects that usually do not require medical attention (report to your doctor or health care professional if they continue or are bothersome): -bone, back, joint, or muscle pain -dizziness -fatigue -fluid retention -headache -hot flashes, night sweats -nausea -weight gain This list may not describe all possible side effects. Call your doctor for medical advice about side effects. You may report side effects to FDA at 1-800-FDA-1088. Where should I keep my medicine? Keep out of the reach of children. Store between 15 and 30 degrees C (59  and 86 degrees F). Throw away any unused medicine after the expiration date. NOTE: This sheet is a summary. It may not cover all possible information. If you have questions about this medicine, talk to your  doctor, pharmacist, or health care provider.  2013, [employer]/Gold Standard. (12/03/2007 4:43:44 PM)

## 2012-10-20 NOTE — Progress Notes (Signed)
Checked in new patient. No financial issues. °

## 2012-10-21 ENCOUNTER — Encounter: Payer: Self-pay | Admitting: *Deleted

## 2012-10-21 ENCOUNTER — Encounter: Payer: Self-pay | Admitting: Specialist

## 2012-10-21 ENCOUNTER — Telehealth: Payer: Self-pay | Admitting: *Deleted

## 2012-10-21 ENCOUNTER — Encounter (INDEPENDENT_AMBULATORY_CARE_PROVIDER_SITE_OTHER): Payer: Self-pay | Admitting: General Surgery

## 2012-10-21 NOTE — Progress Notes (Signed)
Patient ID: Wendy Roth, female   DOB: 1932-04-17, 79 y.o.   MRN: [id number]  Chief Complaint  Patient presents with  . breast cancer    HPI [name] is a 79 y.o. female.  Referred by Dr. Pete Glatter HPI This is a 79 year old female who on her own self exam palpated a right breast mass and her skin being different above that. 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 She has done well until New Year's Eve when she noted his right breast mass. She states she has no nipple discharge tenderness or any other complaints. She has been on Plavix and she had a stent placed in 2005 and she follow Dr. Donnie Aho. She underwent a mammogram in June which was negative. She then felt a mass and underwent a diagnostic right mammogram as well as ultrasound. The mammogram showed a vague ill-defined density in the right lower inner quadrant. This showed skin retraction as well as possible microcalcifications were also in the area. Ultrasound showed an ill-defined hypoechoic area measuring 1.6 x 1.2 x 1.2 cm with some overlying skin thickening. Sonography of the right axilla demonstrates normal lymph nodes. She has also now undergone an MRI which shows a 4.1 x 2.4 x 1.5 cm mass in the inferior right breast. She has a mildly prominent nodes but these are normal on her ultrasound. There no abnormal appearing internal mammary left axillary nodes there is no evidence of malignancy on the left status post lumpectomy. The lesion underwent biopsy and this is shown to be an invasive ductal carcinoma with ductal carcinoma in situ. They appear to be grade 1. Estrogen and progesterone receptors are both positive at 100%. HER-2/neu is not amplified. Her proliferation markers 15%. She comes in today to discuss her  options.  Past Medical History  Diagnosis Date  . Breast cancer   . Hypertension   . Arthritis   . Back pain   . Fatigue   . Anxiety   . Depression     Past Surgical History  Procedure Date  . Tonsillectomy   . Abdominal hysterectomy   . Meniscus repair     left  . Angioplasty     x2  . Carpal tunnel release     bilateral  . Parathyroidectomy   . Breast lumpectomy     left x2    Family History  Problem Relation Age of Onset  . Breast cancer Maternal Aunt     Social History History  Substance Use Topics  . Smoking status: Never Smoker   . Smokeless tobacco: Not on file  . Alcohol Use: Yes    No Known Allergies  Current Outpatient Prescriptions  Medication Sig Dispense Refill  . amLODipine (NORVASC) 5 MG tablet Take 5 mg by mouth daily.      Marland Kitchen aspirin 81 MG tablet Take 81 mg by mouth daily.      . clopidogrel (PLAVIX) 75 MG tablet Take 75 mg by mouth daily.      . DULoxetine (CYMBALTA) 30 MG capsule Take 30 mg by mouth daily.      Marland Kitchen ezetimibe-simvastatin (VYTORIN) 10-20 MG per tablet Take 1 tablet by mouth at bedtime.      Marland Kitchen levothyroxine (SYNTHROID, LEVOTHROID) 25 MCG tablet Take 25 mcg by mouth daily.      Marland Kitchen  lisinopril (PRINIVIL,ZESTRIL) 20 MG tablet Take 20 mg by mouth daily.      . metoprolol succinate (TOPROL-XL) 50 MG 24 hr tablet Take 50 mg by mouth daily. Take with or immediately following a meal.      . temazepam (RESTORIL) 15 MG capsule Take 15 mg by mouth at bedtime as needed.      Marland Kitchen letrozole (FEMARA) 2.5 MG tablet Take 1 tablet (2.5 mg total) by mouth daily.  90 tablet  12    Review of Systems Review of Systems  Constitutional: Negative for fever, chills and unexpected weight change.  HENT: Positive for sinus pressure. Negative for hearing loss, congestion, sore throat, trouble swallowing and voice change.   Eyes: Negative for visual disturbance.  Respiratory: Negative for cough and wheezing.   Cardiovascular: Negative for chest pain,  palpitations and leg swelling.  Gastrointestinal: Negative for nausea, vomiting, abdominal pain, diarrhea, constipation, blood in stool, abdominal distention and anal bleeding.  Genitourinary: Negative for hematuria, vaginal bleeding and difficulty urinating.  Musculoskeletal: Positive for arthralgias.  Skin: Negative for rash and wound.  Neurological: Negative for seizures, syncope and headaches.  Hematological: Negative for adenopathy. Bruises/bleeds easily (on plavix).  Psychiatric/Behavioral: Negative for confusion. The patient is nervous/anxious.     There were no vitals taken for this visit.  Physical Exam Physical Exam  Vitals reviewed. Constitutional: She appears well-developed and well-nourished.  Eyes: No scleral icterus.  Neck: Neck supple.  Cardiovascular: Normal rate, regular rhythm and normal heart sounds.   Pulmonary/Chest: Effort normal and breath sounds normal. She has no wheezes. She has no rales. Right breast exhibits mass. Right breast exhibits no inverted nipple, no nipple discharge, no skin change and no tenderness. Left breast exhibits no inverted nipple, no mass, no nipple discharge, no skin change and no tenderness. Breasts are symmetrical.    Abdominal: Soft.  Lymphadenopathy:    She has no cervical adenopathy.    She has no axillary adenopathy.       Right: No supraclavicular adenopathy present.       Left: No supraclavicular adenopathy present.    Data Reviewed BILATERAL BREAST MRI WITH AND WITHOUT CONTRAST  Technique: Multiplanar, multisequence MR images of both breasts  were obtained prior to and following the intravenous administration  of 8ml of MultiHance. Three dimensional images were evaluated at  the independent DynaCad workstation.  Comparison: Previous examinations, including the recent mammogram,  ultrasound and biopsy examinations.  Findings: Mild background nodular parenchymal enhancement in the  right breast and mild background  parenchymal enhancement without  nodularity in the left breast. Post lumpectomy changes on the  left.  4.1 x 2.4 x 1.5 cm irregularly shaped enhancing mass in the  posterior aspect of the inferior right breast, slightly laterally.  This contains a biopsy marker clip artifact and has a mixture of  plateau and persistent enhancement kinetics.  No additional masses or areas of enhancement suspicious for  malignancy in either breast.  Mildly prominent right axillary lymph nodes. The largest is  inferiorly and anteriorly located at the level of the nipple,  measuring 1.4 x 0.6 cm on image number 72 of the 5-minute  postcontrast images. There is a suggestion of some effacement of  the fatty hilum. There are no abnormal appearing internal mammary  or left axillary lymph nodes.  IMPRESSION:  1. 4.1 x 2.4 x 1.5 cm biopsy-proven invasive ductal carcinoma and  ductal carcinoma in situ in the inferior right breast, slightly  laterally.  2. Mildly  prominent right axillary lymph nodes, the largest  measuring 1.4 cm in maximum diameter. Since there were no abnormal  appearing axillary lymph nodes at recent ultrasound, this can be  assessed with sentinel lymph node biopsy or axillary lymph node  dissection at the time of surgery unless otherwise clinically  indicated.  3. No evidence of malignancy on the left, status post lumpectomy.   Assessment    Clinical stage II right breast cancer    Plan    Primary antiestrogen therapy vs mastectomy   We discussed the staging and pathophysiology of breast cancer. We discussed all of the different options for treatment for breast cancer including surgery, chemotherapy, radiation therapy, Herceptin, and antiestrogen therapy.   We discussed a sentinel lymph node biopsy as she does not appear to having lymph node involvement right now. We will do this at time of her surgeryWe discussed the performance of that with injection of radioactive tracer and blue  dye. We discussed that she would have an incision underneath her axillary hairline. We discussed that there is a bout a 10-20% chance of having a positive node with a sentinel lymph node biopsy and we will await the permanent pathology to make any other first further decisions in terms of her treatment. One of these options might be to return to the operating room to perform an axillary lymph node dissection. We discussed about a 1-2% risk lifetime of chronic shoulder pain as well as lymphedema associated with a sentinel lymph node biopsy.  We discussed the options for treatment of the breast cancer which included lumpectomy versus a mastectomy. We discussed the performance of the lumpectomy with a wire placement. We discussed a 10-20% chance of a positive margin requiring reexcision in the operating room. We also discussed that she may need radiation therapy or antiestrogen therapy or both if she undergoes lumpectomy. We discussed the mastectomy and the postoperative care for that as well. We discussed that there is no difference in her survival whether she undergoes lumpectomy with radiation therapy or antiestrogen therapy versus a mastectomy. There is a slight difference in the local recurrence rate being 3-5% with lumpectomy and about 1% with a mastectomy. She understands that currently she is not really a candidate for a lumpectomy due to her breast size and the size of the lesion. She asked about a bilateral mastectomy given her history. I did tell her that I do not think that that would guarantee her any increase survival and that her risk of breast cancer was not 0 it with a bilateral mastectomy. She had wanted to do this as she thought this would be 0. After we had this discussion she was opened to discussing a lumpectomy possibly as well. I told her that to do a lumpectomy we would need to begin with antiestrogen therapy and see if we can shrink this area. I told her this would likely be a six-month  process and that there was a chance that we might end up doing a mastectomy anyways at the end of it if it did not shrink appropriately. She is going to consider these options including primary antiestrogen therapy, unilateral mastectomy now, or bilateral mastectomy. I will plan on seeing her back in about 10 days. We discussed the risks of operation including bleeding, infection, possible reoperation. She understands her further therapy will be based on what her stages at the time of her operation.         Levent Kornegay 10/21/2012, 12:57 PM

## 2012-10-21 NOTE — Progress Notes (Signed)
I met Wendy Roth and her husband at breast clinic on January 15.  She rated her distress as "0" and appeared to have a very hopeful and positive attitude.  She expressed interest in the support services.  She also indicated that there are issues within her family that concern her and that she will begin seeing a therapist/counselor to help her deal with those issues.  She stated that the timing is also good for helping her deal with the cancer experience.  She did not want a referral to [facility].

## 2012-10-21 NOTE — Telephone Encounter (Signed)
Please schedule genetic appointment with lab

## 2012-11-05 ENCOUNTER — Ambulatory Visit (INDEPENDENT_AMBULATORY_CARE_PROVIDER_SITE_OTHER): Payer: Medicare Other | Admitting: General Surgery

## 2012-11-05 ENCOUNTER — Encounter (INDEPENDENT_AMBULATORY_CARE_PROVIDER_SITE_OTHER): Payer: Self-pay | Admitting: General Surgery

## 2012-11-05 VITALS — BP 118/78 | HR 78 | Temp 97.4°F | Resp 14 | Ht 60.0 in | Wt 168.8 lb

## 2012-11-05 DIAGNOSIS — C50319 Malignant neoplasm of lower-inner quadrant of unspecified female breast: Secondary | ICD-10-CM

## 2012-11-05 NOTE — Progress Notes (Signed)
Subjective:     Patient ID: Wendy Roth, female   DOB: 1932-08-04, 79 y.o.   MRN: [id number]  HPI This is a 79 year old female who I saw in the breast multidisciplinary clinic a couple of weeks ago. She has a prior history of a left lumpectomy followed with radiation therapy for ductal carcinoma in situ in 2003. 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 She then noted a mass. This mass on MRI as a 4.1 x 2.4 x 1.5 cm mass. Her estrogen and progesterone receptors are both positive at 100% and her proliferation markers 15%. In conjunction with both medical and radiation oncology we discussed all of her options. She initially wanted to pursue a bilateral mastectomy. After a long conversation we discussed her other options of a unilateral mastectomy now versus primary antiestrogen therapy to see if we can treat this tumor down and possibly do breast conservation therapy in the future. She decided that she would like to try antiestrogen therapy but returned today just over  These options again. Since our last visit she has been done Fe,ara and she is tolerating this without any difficulty at all. She comes back in today with her husband to discuss these issues.  Review of Systems     Objective:   Physical Exam deferred    Assessment:     Clinical stage II right breast cancer History left breast dcis    Plan:     We discussed all of her options again today. She very much would like to proceed with antiestrogen therapy. She tolerated this very well so far. I discussed with her today again that there is a chance that we may do the antiestrogen therapy for a period about 6 months and she still may require mastectomy. We will make this decision once she completed her therapy at that time. We'll follow her up with another MRI at the end of this period time as well. She is very comfortable with this decision. I will plan on seeing her back in 3 months. I  spent about 20 minutes on this conversation today. She did also bring up the she has a umbilical hernia that has gotten larger over some time. This area really does not bother her. On exam I was able to reduce this and it is not tender. I discussed with her a laparoscopic repair. We discussed the postoperative course as well as the performance of the surgery and she would like to just follow this for now. She understands that there is a risk of needing an emergency surgery.

## 2012-11-13 ENCOUNTER — Encounter: Payer: Self-pay | Admitting: *Deleted

## 2012-11-13 NOTE — Progress Notes (Signed)
Mailed after appt letter to pt. 

## 2012-11-20 ENCOUNTER — Other Ambulatory Visit: Payer: Self-pay

## 2012-11-22 ENCOUNTER — Telehealth: Payer: Self-pay | Admitting: *Deleted

## 2012-11-22 NOTE — Telephone Encounter (Signed)
Pt called c/o Left calf pain/ache and left ankle numbness.  Pt relay it is not hot to touch, no swelling and no knot.  Spoke with Augustin Schooling, NP.  Informed to have pt scheduled to see Dr. Welton Flakes for f/u.  Scheduled pt to see Dr. Welton Flakes on 11/23/12.  Confirmed date and time with pt.

## 2012-11-23 ENCOUNTER — Telehealth: Payer: Self-pay | Admitting: Oncology

## 2012-11-23 ENCOUNTER — Encounter: Payer: Self-pay | Admitting: Oncology

## 2012-11-23 ENCOUNTER — Emergency Department (HOSPITAL_BASED_OUTPATIENT_CLINIC_OR_DEPARTMENT_OTHER)
Admission: EM | Admit: 2012-11-23 | Discharge: 2012-11-23 | Disposition: A | Discharge status: Home / Self Care | Payer: Medicare Other | Attending: Emergency Medicine | Admitting: Emergency Medicine

## 2012-11-23 ENCOUNTER — Telehealth: Payer: Self-pay | Admitting: *Deleted

## 2012-11-23 ENCOUNTER — Ambulatory Visit (HOSPITAL_COMMUNITY)
Admission: RE | Admit: 2012-11-23 | Discharge: 2012-11-23 | Disposition: A | Discharge status: Home / Self Care | Payer: Medicare Other | Source: Ambulatory Visit | Attending: Oncology | Admitting: Oncology

## 2012-11-23 ENCOUNTER — Encounter (HOSPITAL_BASED_OUTPATIENT_CLINIC_OR_DEPARTMENT_OTHER): Payer: Self-pay | Admitting: *Deleted

## 2012-11-23 ENCOUNTER — Ambulatory Visit (HOSPITAL_BASED_OUTPATIENT_CLINIC_OR_DEPARTMENT_OTHER): Payer: Medicare Other | Admitting: Oncology

## 2012-11-23 VITALS — BP 142/79 | HR 70 | Temp 97.9°F | Resp 20 | Ht 60.0 in | Wt 167.8 lb

## 2012-11-23 DIAGNOSIS — F411 Generalized anxiety disorder: Secondary | ICD-10-CM | POA: Insufficient documentation

## 2012-11-23 DIAGNOSIS — I10 Essential (primary) hypertension: Secondary | ICD-10-CM | POA: Insufficient documentation

## 2012-11-23 DIAGNOSIS — F329 Major depressive disorder, single episode, unspecified: Secondary | ICD-10-CM | POA: Insufficient documentation

## 2012-11-23 DIAGNOSIS — M660 Rupture of popliteal cyst: Secondary | ICD-10-CM

## 2012-11-23 DIAGNOSIS — M79609 Pain in unspecified limb: Secondary | ICD-10-CM

## 2012-11-23 DIAGNOSIS — M7120 Synovial cyst of popliteal space [Baker], unspecified knee: Secondary | ICD-10-CM | POA: Insufficient documentation

## 2012-11-23 DIAGNOSIS — Z170 Estrogen receptor positive status [ER+]: Secondary | ICD-10-CM

## 2012-11-23 DIAGNOSIS — Z853 Personal history of malignant neoplasm of breast: Secondary | ICD-10-CM | POA: Insufficient documentation

## 2012-11-23 DIAGNOSIS — Z7982 Long term (current) use of aspirin: Secondary | ICD-10-CM | POA: Insufficient documentation

## 2012-11-23 DIAGNOSIS — Z7901 Long term (current) use of anticoagulants: Secondary | ICD-10-CM | POA: Insufficient documentation

## 2012-11-23 DIAGNOSIS — F3289 Other specified depressive episodes: Secondary | ICD-10-CM | POA: Insufficient documentation

## 2012-11-23 DIAGNOSIS — C50319 Malignant neoplasm of lower-inner quadrant of unspecified female breast: Secondary | ICD-10-CM

## 2012-11-23 DIAGNOSIS — Z79899 Other long term (current) drug therapy: Secondary | ICD-10-CM | POA: Insufficient documentation

## 2012-11-23 DIAGNOSIS — Z8739 Personal history of other diseases of the musculoskeletal system and connective tissue: Secondary | ICD-10-CM | POA: Insufficient documentation

## 2012-11-23 DIAGNOSIS — E559 Vitamin D deficiency, unspecified: Secondary | ICD-10-CM

## 2012-11-23 NOTE — ED Notes (Signed)
Patient transported to Ultrasound via stretcher 

## 2012-11-23 NOTE — Progress Notes (Deleted)
OFFICE PROGRESS NOTE  CC**  POLITE,RONALD D, MD [address] [place] [address]  DIAGNOSIS: ***  PRIOR THERAPY:***  CURRENT THERAPY:***  INTERVAL HISTORY: Wendy Roth 79 y.o. female returns for***  MEDICAL HISTORY: Past Medical History  Diagnosis Date  . Breast cancer   . Hypertension   . Arthritis   . Back pain   . Fatigue   . Anxiety   . Depression     ALLERGIES:  has No Known Allergies.  MEDICATIONS:  Current Outpatient Prescriptions  Medication Sig Dispense Refill  . amLODipine (NORVASC) 5 MG tablet Take 5 mg by mouth daily.      Marland Kitchen aspirin 81 MG tablet Take 81 mg by mouth daily.      . clopidogrel (PLAVIX) 75 MG tablet Take 75 mg by mouth daily.      . DULoxetine (CYMBALTA) 30 MG capsule Take 60 mg by mouth 2 (two) times daily.       Marland Kitchen ezetimibe-simvastatin (VYTORIN) 10-20 MG per tablet Take 1 tablet by mouth every other day. Taking every other day      . letrozole (FEMARA) 2.5 MG tablet Take 1 tablet (2.5 mg total) by mouth daily.  90 tablet  12  . levothyroxine (SYNTHROID, LEVOTHROID) 25 MCG tablet Take 25 mcg by mouth daily.      Marland Kitchen lisinopril (PRINIVIL,ZESTRIL) 20 MG tablet Take 20 mg by mouth daily.      . metoprolol succinate (TOPROL-XL) 50 MG 24 hr tablet Take 50 mg by mouth daily. Take with or immediately following a meal.      . temazepam (RESTORIL) 15 MG capsule Take 15 mg by mouth at bedtime as needed.       No current facility-administered medications for this visit.    SURGICAL HISTORY:  Past Surgical History  Procedure Laterality Date  . Tonsillectomy    . Abdominal hysterectomy    . Meniscus repair      left  . Angioplasty      x2  . Carpal tunnel release      bilateral  . Parathyroidectomy    . Breast lumpectomy      left x2    REVIEW OF SYSTEMS:  {Ros - complete:30496}   HEALTH MAINTENANCE:  Mammogram*** Colonoscopy*** Bone  Scan*** Pap Smear*** Eye Exam*** Vitamin D*** Lipid Panel***  PHYSICAL  EXAMINATION: Blood pressure 142/79, pulse 70, temperature 97.9 F (36.6 C), temperature source Oral, resp. rate 20, height 5' (1.524 m), weight 167 lb 12.8 oz (76.114 kg). Body mass index is 32.77 kg/(m^2). ECOG PERFORMANCE STATUS: {CHL ONC ECOG [id number]   {physical exam:21449}   LABORATORY DATA: Lab Results  Component Value Date   WBC 6.7 10/20/2012   HGB 14.7 10/20/2012   HCT 42.9 10/20/2012   MCV 89.9 10/20/2012   PLT 259 10/20/2012      Chemistry      Component Value Date/Time   NA 141 10/20/2012 1248   K 4.5 10/20/2012 1248   CL 104 10/20/2012 1248   CO2 27 10/20/2012 1248   BUN 18.0 10/20/2012 1248   CREATININE 1.1 10/20/2012 1248      Component Value Date/Time   CALCIUM 9.3 10/20/2012 1248   ALKPHOS 52 10/20/2012 1248   AST 18 10/20/2012 1248   ALT 18 10/20/2012 1248   BILITOT 0.76 10/20/2012 1248       RADIOGRAPHIC STUDIES:  No results found.  ASSESSMENT: ***   PLAN: ***   All questions were answered. The patient  knows to call the clinic with any problems, questions or concerns. We can certainly see the patient much sooner if necessary.  I spent {CHL ONC TIME VISIT - ZOXWR:[id number]} counseling the patient face to face. The total time spent in the appointment was {CHL ONC TIME VISIT - BJYNW:[id number]}.    Drue Second, MD Medical/Oncology [facility] [phone] (beeper) [phone] (Office)  11/23/2012, 12:47 PM

## 2012-11-23 NOTE — Patient Instructions (Addendum)
Proceed with left leg doppler to rule out a blood clot  i will see you back in 3 months

## 2012-11-23 NOTE — Telephone Encounter (Signed)
Let patient know the results.

## 2012-11-23 NOTE — Telephone Encounter (Signed)
Wendy Roth reports patient is negative for DVT of left leg.  She does have a ruptured Baker's Cyst on the left.  Will notify providers.  Patient released to go home.

## 2012-11-23 NOTE — Progress Notes (Addendum)
*  PRELIMINARY RESULTS* Vascular Ultrasound Left lower extremity venous duplex has been completed.  Preliminary findings: Left= no evidence of DVT. There appears to be a large baker's cyst that has ruptured into the proximal calf.    Called report to Roselynn at Dr. Milta Deiters office. She will give report to Dr. Welton Flakes.  Farrel Demark, RDMS, RVT  11/23/2012, 1:45 PM

## 2012-11-23 NOTE — ED Notes (Signed)
MD at bedside. 

## 2012-11-23 NOTE — Progress Notes (Signed)
OFFICE PROGRESS NOTE  CC  POLITE,RONALD D, MD [address] [place] [address] Dr. Emelia Loron Dr. Lonie Peak  DIAGNOSIS: 79 year old female who self palpated a mass in her right breast. 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  STAGE:  Cancer of lower-inner quadrant of female breast  Primary site: Breast (Right)  Staging method: AJCC 7th Edition  Clinical: Stage IIA (T2, N0, cM0)  Summary: Stage IIA (T2, N0, cM0)  PRIOR THERAPY:  #1Recently on self breast examination patient found a mass in the right breast. Because of this she underwent a mammogram ultrasound and eventually a biopsy. The biopsy showed a grade 1 invasive ductal carcinoma with DCIS. The tumor was ER +100% PR +100% HER-2/neu negative with a proliferation marker Ki-67 low at 15%. Patient went on to then have MRI of the breasts performed that showed a 4.1 cm area of enhancement. No other abnormalities were seen  2 patient was interested in breast conservation and she was begun on antiestrogen therapy neoadjuvant daily with Femara2.5 mg daily in January 2014.she is tolerating this well.  CURRENT THERAPY:Femara 2.5 mg daily since January 2014 in the neoadjuvant setting.  INTERVAL HISTORY: Wendy Roth 79 y.o. female returns for followup visit today. Overall she's doing well without any significant complaints. She denies any fevers chills night sweats headaches shortness of breath chest pains palpitations. She feels that she is tolerating the letrozole well without any significant problems.  MEDICAL HISTORY: Past Medical History  Diagnosis Date  . Breast cancer   . Hypertension   . Arthritis   . Back pain   . Fatigue   . Anxiety   . Depression     ALLERGIES:  has No Known Allergies.  MEDICATIONS:  Current Outpatient Prescriptions  Medication Sig Dispense Refill  . amLODipine (NORVASC) 5 MG tablet  Take 5 mg by mouth daily.      Marland Kitchen aspirin 81 MG tablet Take 81 mg by mouth daily.      . clopidogrel (PLAVIX) 75 MG tablet Take 75 mg by mouth daily.      . DULoxetine (CYMBALTA) 30 MG capsule Take 60 mg by mouth 2 (two) times daily.       Marland Kitchen ezetimibe-simvastatin (VYTORIN) 10-20 MG per tablet Take 1 tablet by mouth every other day. Taking every other day      . letrozole (FEMARA) 2.5 MG tablet Take 1 tablet (2.5 mg total) by mouth daily.  90 tablet  12  . levothyroxine (SYNTHROID, LEVOTHROID) 25 MCG tablet Take 25 mcg by mouth daily.      Marland Kitchen lisinopril (PRINIVIL,ZESTRIL) 20 MG tablet Take 20 mg by mouth daily.      . metoprolol succinate (TOPROL-XL) 50 MG 24 hr tablet Take 50 mg by mouth daily. Take with or immediately following a meal.      . temazepam (RESTORIL) 15 MG capsule Take 15 mg by mouth at bedtime as needed.       No current facility-administered medications for this visit.    SURGICAL HISTORY:  Past Surgical History  Procedure Laterality Date  . Tonsillectomy    . Abdominal hysterectomy    . Meniscus repair      left  . Angioplasty      x2  . Carpal tunnel release      bilateral  . Parathyroidectomy    . Breast lumpectomy      left x2  REVIEW OF SYSTEMS:  Pertinent items are noted in HPI.   HEALTH MAINTENANCE:  PHYSICAL EXAMINATION: Blood pressure 142/79, pulse 70, temperature 97.9 F (36.6 C), temperature source Oral, resp. rate 20, height 5' (1.524 m), weight 167 lb 12.8 oz (76.114 kg). Body mass index is 32.77 kg/(m^2). ECOG PERFORMANCE STATUS: 0 - Asymptomatic   General appearance: alert, cooperative and appears stated age Resp: clear to auscultation bilaterally Back: symmetric, no curvature. ROM normal. No CVA tenderness. Cardio: regular rate and rhythm GI: soft, non-tender; bowel sounds normal; no masses,  no organomegaly Extremities: extremities normal, atraumatic, no cyanosis or edema Neurologic: Grossly normal   LABORATORY DATA: Lab Results   Component Value Date   WBC 6.7 10/20/2012   HGB 14.7 10/20/2012   HCT 42.9 10/20/2012   MCV 89.9 10/20/2012   PLT 259 10/20/2012      Chemistry      Component Value Date/Time   NA 141 10/20/2012 1248   K 4.5 10/20/2012 1248   CL 104 10/20/2012 1248   CO2 27 10/20/2012 1248   BUN 18.0 10/20/2012 1248   CREATININE 1.1 10/20/2012 1248      Component Value Date/Time   CALCIUM 9.3 10/20/2012 1248   ALKPHOS 52 10/20/2012 1248   AST 18 10/20/2012 1248   ALT 18 10/20/2012 1248   BILITOT 0.76 10/20/2012 1248       RADIOGRAPHIC STUDIES:  No results found.  ASSESSMENT: 79 year old female with  #1 stage IIa invasive ductal carcinoma of the right breast diagnosed December 2013. She is on neoadjuvant treatment with letrozole 2.5 mg daily. So far she is tolerating it well without any significant problems.we will continue this for now.  #2 eventually she will need MRI of the breasts possibly in about 3 months time. I will get the set up for her.   PLAN:   #1 continue letrozole 2-1/2 mg on a daily basis.  #2 MRIs of the breasts to evaluate response to therapy in about 2-3 months time.   All questions were answered. The patient knows to call the clinic with any problems, questions or concerns. We can certainly see the patient much sooner if necessary.  I spent 25 minutes counseling the patient face to face. The total time spent in the appointment was 30 minutes.    Drue Second, MD Medical/Oncology [facility] [phone] (beeper) [phone] (Office)  11/23/2012, 12:40 PM

## 2012-11-23 NOTE — ED Notes (Signed)
Left leg pain. Was seen by her MD today for pain and had an U/S that showed a ruptured bakers cyst. Her pain got worse this evening and is not getting better with Vicodin.

## 2012-11-23 NOTE — ED Provider Notes (Signed)
History     CSN: [id number]  Arrival date & time 11/23/12  2033   First MD Initiated Contact with Patient 11/23/12 2049      Chief Complaint  Patient presents with  . Leg Pain    (Consider location/radiation/quality/duration/timing/severity/associated sxs/prior treatment) Patient is a 79 y.o. female presenting with leg pain. The history is provided by the patient.  Leg Pain Location:  Knee Injury: no   Knee location:  L knee Pain details:    Quality:  Sharp, shooting, tearing and throbbing   Radiates to:  L leg   Severity:  Severe   Onset quality:  Sudden   Timing:  Constant   Progression:  Improving Chronicity:  New Prior injury to area:  No Relieved by:  Nothing Worsened by:  Activity and bearing weight Ineffective treatments: tried vicodin without improvement. Associated symptoms: no fever   Associated symptoms comment:  Had U/S today that showed a partially ruptured baker's cyst Risk factors: no frequent fractures     Past Medical History  Diagnosis Date  . Breast cancer   . Hypertension   . Arthritis   . Back pain   . Fatigue   . Anxiety   . Depression     Past Surgical History  Procedure Laterality Date  . Tonsillectomy    . Abdominal hysterectomy    . Meniscus repair      left  . Angioplasty      x2  . Carpal tunnel release      bilateral  . Parathyroidectomy    . Breast lumpectomy      left x2    Family History  Problem Relation Age of Onset  . Breast cancer Maternal Aunt     History  Substance Use Topics  . Smoking status: Never Smoker   . Smokeless tobacco: Not on file  . Alcohol Use: Yes    OB History   Grav Para Term Preterm Abortions TAB SAB Ect Mult Living                  Review of Systems  Constitutional: Negative for fever.  Musculoskeletal: Positive for joint swelling.  Neurological: Negative for weakness and numbness.  All other systems reviewed and are negative.    Allergies  Review of patient's allergies  indicates no known allergies.  Home Medications   Current Outpatient Rx  Name  Route  Sig  Dispense  Refill  . amLODipine (NORVASC) 5 MG tablet   Oral   Take 5 mg by mouth daily.         Marland Kitchen aspirin 81 MG tablet   Oral   Take 81 mg by mouth daily.         . clopidogrel (PLAVIX) 75 MG tablet   Oral   Take 75 mg by mouth daily.         . DULoxetine (CYMBALTA) 30 MG capsule   Oral   Take 60 mg by mouth 2 (two) times daily.          Marland Kitchen ezetimibe-simvastatin (VYTORIN) 10-20 MG per tablet   Oral   Take 1 tablet by mouth every other day. Taking every other day         . letrozole (FEMARA) 2.5 MG tablet   Oral   Take 1 tablet (2.5 mg total) by mouth daily.   90 tablet   12   . levothyroxine (SYNTHROID, LEVOTHROID) 25 MCG tablet   Oral   Take 25 mcg by mouth daily.         Marland Kitchen  lisinopril (PRINIVIL,ZESTRIL) 20 MG tablet   Oral   Take 20 mg by mouth daily.         . metoprolol succinate (TOPROL-XL) 50 MG 24 hr tablet   Oral   Take 50 mg by mouth daily. Take with or immediately following a meal.         . temazepam (RESTORIL) 15 MG capsule   Oral   Take 15 mg by mouth at bedtime as needed.           BP 113/65  Pulse 67  Temp(Src) 98.1 F (36.7 C) (Oral)  Resp 22  SpO2 100%  Physical Exam  Nursing note and vitals reviewed. Constitutional: She is oriented to person, place, and time. She appears well-developed and well-nourished. No distress.  HENT:  Head: Normocephalic and atraumatic.  Mouth/Throat: Oropharynx is clear and moist.  Eyes: EOM are normal. Pupils are equal, round, and reactive to light.  Neck: Normal range of motion. Neck supple.  Cardiovascular: Normal rate and intact distal pulses.   Pulmonary/Chest: Effort normal.  Musculoskeletal: Normal range of motion. She exhibits tenderness. She exhibits no edema.       Left knee: She exhibits swelling, effusion and ecchymosis. Tenderness found.       Legs: Neurological: She is alert and  oriented to person, place, and time.  Skin: Skin is warm and dry. No rash noted. No erythema.  Psychiatric: She has a normal mood and affect. Her behavior is normal.    ED Course  Procedures (including critical care time)  Labs Reviewed - No data to display No results found.   1. Ruptured Bakers cyst       MDM   Patient seen earlier today and had an ultrasound that showed a large left Baker's cyst with some rupture into the calf. Tonight while she was at dinner after standing up she developed a severe pain in the back of her left knee and swelling of her calf. On arrival here pain was starting to improve. Patient had a normal pulse and sensation and her calf was soft. She states it actually was starting to get much better. No ultrasound available here tonight however put a bedside ultrasound on the patient and saw a large amount of excess of fluid but no contains a cyst at this time. Feel most likely the patient completely ruptured the cyst. There is no sign of compartment syndrome, nerve entrapment. Knee was wrapped and iced and she will elevate and use NSAIDs. She will followup with Dr. Charlann Boxer prn.        Gwyneth Sprout, MD 11/23/12 2244

## 2012-11-24 ENCOUNTER — Telehealth: Payer: Self-pay | Admitting: Medical Oncology

## 2012-11-24 NOTE — Progress Notes (Signed)
Quick Note:  Call patient: doppler study negative for DVT ______

## 2012-11-24 NOTE — Telephone Encounter (Signed)
Message copied by Rexene Edison on Wed Nov 24, 2012  4:59 PM ------      Message from: Wendy Roth      Created: Wed Nov 24, 2012  3:42 PM       Call patient: doppler study negative for DVT ------

## 2012-11-24 NOTE — Telephone Encounter (Signed)
Per MD, informed patient that doppler study negative for DVT. Patient states she was in ED 02/18 d/t baker's cyst rupturing, home now and doing well. No further questions at this time. Patient knows to call with any questions or concerns.

## 2012-12-29 ENCOUNTER — Other Ambulatory Visit: Payer: Self-pay | Admitting: Dermatology

## 2013-01-04 ENCOUNTER — Other Ambulatory Visit (HOSPITAL_COMMUNITY)
Admission: RE | Admit: 2013-01-04 | Discharge: 2013-01-04 | Disposition: A | Discharge status: Home / Self Care | Payer: Medicare Other | Source: Ambulatory Visit | Attending: Internal Medicine | Admitting: Internal Medicine

## 2013-01-04 ENCOUNTER — Other Ambulatory Visit: Payer: Self-pay | Admitting: Internal Medicine

## 2013-01-04 DIAGNOSIS — Z01419 Encounter for gynecological examination (general) (routine) without abnormal findings: Secondary | ICD-10-CM | POA: Insufficient documentation

## 2013-01-04 DIAGNOSIS — Z1151 Encounter for screening for human papillomavirus (HPV): Secondary | ICD-10-CM | POA: Insufficient documentation

## 2013-01-18 ENCOUNTER — Encounter: Payer: Self-pay | Admitting: Oncology

## 2013-01-18 ENCOUNTER — Telehealth: Payer: Self-pay | Admitting: Genetic Counselor

## 2013-01-18 NOTE — Telephone Encounter (Signed)
Moved 4/17 genetics appt from 4/17 to 4/30 w/covering provider - Clydie Braun on [facility].

## 2013-01-19 ENCOUNTER — Ambulatory Visit: Payer: Medicare Other | Admitting: Oncology

## 2013-01-19 ENCOUNTER — Other Ambulatory Visit: Payer: Medicare Other | Admitting: Lab

## 2013-01-20 ENCOUNTER — Telehealth: Payer: Self-pay | Admitting: *Deleted

## 2013-01-20 ENCOUNTER — Emergency Department (HOSPITAL_BASED_OUTPATIENT_CLINIC_OR_DEPARTMENT_OTHER)
Admission: EM | Admit: 2013-01-20 | Discharge: 2013-01-20 | Disposition: A | Discharge status: Home / Self Care | Payer: Medicare Other | Attending: Emergency Medicine | Admitting: Emergency Medicine

## 2013-01-20 ENCOUNTER — Encounter: Payer: Medicare Other | Admitting: Genetic Counselor

## 2013-01-20 ENCOUNTER — Encounter (HOSPITAL_BASED_OUTPATIENT_CLINIC_OR_DEPARTMENT_OTHER): Payer: Self-pay

## 2013-01-20 ENCOUNTER — Other Ambulatory Visit: Payer: Medicare Other | Admitting: Lab

## 2013-01-20 DIAGNOSIS — F3289 Other specified depressive episodes: Secondary | ICD-10-CM | POA: Insufficient documentation

## 2013-01-20 DIAGNOSIS — B370 Candidal stomatitis: Secondary | ICD-10-CM | POA: Insufficient documentation

## 2013-01-20 DIAGNOSIS — Z8739 Personal history of other diseases of the musculoskeletal system and connective tissue: Secondary | ICD-10-CM | POA: Insufficient documentation

## 2013-01-20 DIAGNOSIS — R599 Enlarged lymph nodes, unspecified: Secondary | ICD-10-CM | POA: Insufficient documentation

## 2013-01-20 DIAGNOSIS — Z7982 Long term (current) use of aspirin: Secondary | ICD-10-CM | POA: Insufficient documentation

## 2013-01-20 DIAGNOSIS — Z7902 Long term (current) use of antithrombotics/antiplatelets: Secondary | ICD-10-CM | POA: Insufficient documentation

## 2013-01-20 DIAGNOSIS — H9209 Otalgia, unspecified ear: Secondary | ICD-10-CM | POA: Insufficient documentation

## 2013-01-20 DIAGNOSIS — Z79899 Other long term (current) drug therapy: Secondary | ICD-10-CM | POA: Insufficient documentation

## 2013-01-20 DIAGNOSIS — Z853 Personal history of malignant neoplasm of breast: Secondary | ICD-10-CM | POA: Insufficient documentation

## 2013-01-20 DIAGNOSIS — F329 Major depressive disorder, single episode, unspecified: Secondary | ICD-10-CM | POA: Insufficient documentation

## 2013-01-20 DIAGNOSIS — I10 Essential (primary) hypertension: Secondary | ICD-10-CM | POA: Insufficient documentation

## 2013-01-20 DIAGNOSIS — F411 Generalized anxiety disorder: Secondary | ICD-10-CM | POA: Insufficient documentation

## 2013-01-20 MED ORDER — FLUCONAZOLE 200 MG PO TABS: 200.0000 mg | ORAL_TABLET | Freq: Every day | ORAL | Status: AC

## 2013-01-20 MED ORDER — FLUCONAZOLE 100 MG PO TABS
200.0000 mg | ORAL_TABLET | Freq: Once | ORAL | Status: AC
  Administered 2013-01-20: 200 mg via ORAL
  Filled 2013-01-20: qty 2

## 2013-01-20 NOTE — Telephone Encounter (Signed)
Patient calling in to report that she [facility] due to what she says is a sore throat. She was informed she should see her doctor.  Denies any fever, no redness of throat seen yesterday. She states she did seem to have a coating on her tongue this morning. Patient currently not on active therapy, only on Femara for her breast cancer. Informed patient this typically does not cause mouth/throat irritation. Dr Welton Flakes is out of the office this week. Instructed patient to call primary MD for further assessment of other causative factors. Note that patient also takes thyroid meds and has had thyroid surgery. Patient states she will call her primary or go to local [facility] close to her home in [facility]. Instructed patient to have them call our office if any of her issues are felt to be caused from her cancer or meds for her cancer. Patient verbalized understanding.

## 2013-01-20 NOTE — ED Notes (Signed)
C/o pain to throat "feels like somebody punched me. It's tender to the touch"-started lat night

## 2013-01-20 NOTE — ED Provider Notes (Signed)
History     CSN: [id number]  Arrival date & time 01/20/13  1355   First MD Initiated Contact with Patient 01/20/13 1412      Chief Complaint  Patient presents with  . Sore Throat    (Consider location/radiation/quality/duration/timing/severity/associated sxs/prior treatment) The history is provided by the patient.  Wendy Roth is a 79 y.o. female hx of HTN, depression, breast cancer on fumara here with sore throat. Sore throat for 4 days. Sore throat is intermittent. Initially she had some lymphadenopathy in her neck that got slightly worse. She denies fevers. She said that she has some pain when she swallows but denies vomiting. Also some L ear pain but no sinus congestion. She noticed white tongue lesions that scraps off this morning. She is on fumara but no immunosuppressants or recent abx use. Denies ever having STDs or HIV.    Past Medical History  Diagnosis Date  . Breast cancer   . Hypertension   . Arthritis   . Back pain   . Fatigue   . Anxiety   . Depression     Past Surgical History  Procedure Laterality Date  . Tonsillectomy    . Abdominal hysterectomy    . Meniscus repair      left  . Angioplasty      x2  . Carpal tunnel release      bilateral  . Parathyroidectomy    . Breast lumpectomy      left x2    Family History  Problem Relation Age of Onset  . Breast cancer Maternal Aunt     History  Substance Use Topics  . Smoking status: Never Smoker   . Smokeless tobacco: Not on file  . Alcohol Use: Yes    OB History   Grav Para Term Preterm Abortions TAB SAB Ect Mult Living                  Review of Systems  HENT:       Sore throat   All other systems reviewed and are negative.    Allergies  Review of patient's allergies indicates no known allergies.  Home Medications   Current Outpatient Rx  Name  Route  Sig  Dispense  Refill  . ROPINIROLE HCL PO   Oral   Take by mouth.         Marland Kitchen amLODipine (NORVASC) 5 MG tablet  Oral   Take 5 mg by mouth daily.         Marland Kitchen aspirin 81 MG tablet   Oral   Take 81 mg by mouth daily.         . clopidogrel (PLAVIX) 75 MG tablet   Oral   Take 75 mg by mouth daily.         . DULoxetine (CYMBALTA) 30 MG capsule   Oral   Take 60 mg by mouth 2 (two) times daily.          Marland Kitchen ezetimibe-simvastatin (VYTORIN) 10-20 MG per tablet   Oral   Take 1 tablet by mouth every other day. Taking every other day         . fluconazole (DIFLUCAN) 200 MG tablet   Oral   Take 1 tablet (200 mg total) by mouth daily.   7 tablet   0   . letrozole (FEMARA) 2.5 MG tablet   Oral   Take 1 tablet (2.5 mg total) by mouth daily.   90 tablet   12   .  levothyroxine (SYNTHROID, LEVOTHROID) 25 MCG tablet   Oral   Take 25 mcg by mouth daily.         Marland Kitchen lisinopril (PRINIVIL,ZESTRIL) 20 MG tablet   Oral   Take 20 mg by mouth daily.         . metoprolol succinate (TOPROL-XL) 50 MG 24 hr tablet   Oral   Take 50 mg by mouth daily. Take with or immediately following a meal.         . temazepam (RESTORIL) 15 MG capsule   Oral   Take 15 mg by mouth at bedtime as needed.           BP 137/74  Pulse 76  Temp(Src) 98.7 F (37.1 C) (Oral)  Resp 18  Ht 5\' 1"  (1.549 m)  Wt 168 lb (76.204 kg)  BMI 31.76 kg/m2  SpO2 94%  Physical Exam  Nursing note and vitals reviewed. Constitutional: She is oriented to person, place, and time. She appears well-developed and well-nourished.  NAD   HENT:  Head: Normocephalic.  Some white thrush on tongue that is easily removed. No obvious lesions on OP. No tonsils (hx of tonsillectomy). Bilateral TMs normal, L canal with some cerumen   Eyes: Conjunctivae are normal. Pupils are equal, round, and reactive to light.  Neck: Normal range of motion. Neck supple.  Mild tender LAD, thyroid not enlarged   Cardiovascular: Normal rate, regular rhythm and normal heart sounds.   Pulmonary/Chest: Effort normal and breath sounds normal. No respiratory  distress. She has no wheezes. She has no rales.  Abdominal: Soft. Bowel sounds are normal. She exhibits no distension. There is no tenderness. There is no rebound and no guarding.  Musculoskeletal: Normal range of motion. She exhibits no edema and no tenderness.  Neurological: She is alert and oriented to person, place, and time.  Skin: Skin is warm and dry.  Psychiatric: She has a normal mood and affect. Her behavior is normal. Judgment and thought content normal.    ED Course  Procedures (including critical care time)  Labs Reviewed - No data to display No results found.   1. Oral candidiasis   2. Thrush       MDM  Wendy Roth is a 79 y.o. female here with oral thrush. Given she has sore throat and some pain with swallowing, concerned for possible esophageal candidiasis as well. No hx of HIV, immunosupression and no DM or recent abx use. I gave her diflucan in the ED and will give a course of diflucan for a week. Not concerned for strep and she has 1/4 centor criteria. Return precautions given. Stable for dc.         Richardean Canal, MD 01/20/13 [phone]

## 2013-02-01 ENCOUNTER — Ambulatory Visit (INDEPENDENT_AMBULATORY_CARE_PROVIDER_SITE_OTHER): Payer: Medicare Other | Admitting: General Surgery

## 2013-02-01 ENCOUNTER — Encounter (INDEPENDENT_AMBULATORY_CARE_PROVIDER_SITE_OTHER): Payer: Self-pay | Admitting: General Surgery

## 2013-02-01 VITALS — BP 128/74 | HR 64 | Temp 98.0°F | Resp 18 | Ht 60.0 in | Wt 169.0 lb

## 2013-02-01 DIAGNOSIS — C50319 Malignant neoplasm of lower-inner quadrant of unspecified female breast: Secondary | ICD-10-CM

## 2013-02-01 DIAGNOSIS — C50311 Malignant neoplasm of lower-inner quadrant of right female breast: Secondary | ICD-10-CM

## 2013-02-01 NOTE — Progress Notes (Signed)
Subjective:     Patient ID: Wendy Roth, female   DOB: 01-30-1932, 79 y.o.   MRN: [id number]  HPI This is a 79 year old female who I saw in the breast multidisciplinary clinic previously. 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 She then noted a mass. This mass on MRI is a 4.1 x 2.4 x 1.5 cm mass. Her estrogen and progesterone receptors are both positive at 100% and her proliferation marker is 15%. In conjunction with both medical and radiation oncology we discussed all of her options. She decided that she would like to try antiestrogen therapy to try to downsize this lesion.  She has been tolerating the letrozole well except for the fact that she has no sexual desire. She does note the right breast mass is smaller she really cannot identify it right now. In the interim she has also had a basal cell carcinoma removed with Mohs surgery on her left cheek.   Review of Systems     Objective:   Physical Exam I cannot really identify discrete right breast mass now either, no lad bilateral axilla Left breast without mass, discharge No cervical or [place] nodes    Assessment:     Clinical stage II right breast cancer on primary endocrine therapy     Plan:     I do think she is having a fairly good result up to this point at least clinically. I told her that I think at 6 months we will obtain an MRI. I ordered this today and I will plan on seeing her back right after that MRI with the plan of proceeding with surgery at that time. She will talk to the geneticist tomorrow. She will also follow up with medical oncology.

## 2013-02-02 ENCOUNTER — Other Ambulatory Visit: Payer: Medicare Other | Admitting: Lab

## 2013-02-02 ENCOUNTER — Telehealth: Payer: Self-pay | Admitting: Oncology

## 2013-02-02 NOTE — Telephone Encounter (Signed)
Returned pt's call to r/s 4/30 genetics appt and gv pt new appt for 5/16 @ 10am for genetics.

## 2013-02-18 ENCOUNTER — Ambulatory Visit (HOSPITAL_BASED_OUTPATIENT_CLINIC_OR_DEPARTMENT_OTHER): Payer: Medicare Other | Admitting: Genetic Counselor

## 2013-02-18 ENCOUNTER — Other Ambulatory Visit: Payer: Medicare Other | Admitting: Lab

## 2013-02-18 DIAGNOSIS — Z808 Family history of malignant neoplasm of other organs or systems: Secondary | ICD-10-CM

## 2013-02-18 DIAGNOSIS — C50919 Malignant neoplasm of unspecified site of unspecified female breast: Secondary | ICD-10-CM

## 2013-02-18 DIAGNOSIS — Z803 Family history of malignant neoplasm of breast: Secondary | ICD-10-CM

## 2013-02-18 HISTORY — DX: Family history of malignant neoplasm of breast: Z80.3

## 2013-02-18 NOTE — Progress Notes (Signed)
Dr. Nehemiah Settle requested a cancer genetics consultation for Wendy Roth, a 79 y.o. female, due to a personal and family history of cancer.  [name] presents to clinic today to discuss the possibility of a hereditary predisposition to cancer, genetic testing, and to further clarify her future cancer risks, as well as potential cancer risk for family members.   HISTORY OF PRESENT ILLNESS: Ms. [name] was diagnosed withleft breast cancer at the age of 42. She underwent lumpectomy and radiation therapy. She more recently was diagnosed with right breast cancer, IDC, at age 38. The breast tumor is ER positive, PR positive, Her2Neu negative. She is currently on neoadjuvant hormone therapy and is planning to undergo lumpectomy and radiation this summer. She was diagnosed with cervical cancer at age 78 and is s/p TAH-USO. She does not remember which ovary remains.   Past Medical History  Diagnosis Date   Breast cancer    Hypertension    Arthritis    Back pain    Fatigue    Anxiety    Depression     Past Surgical History  Procedure Laterality Date   Tonsillectomy     Abdominal hysterectomy     Meniscus repair      left   Angioplasty      x2   Carpal tunnel release      bilateral   Parathyroidectomy     Breast lumpectomy      left x2    History   Social History   Marital Status: Married    Spouse Name: N/A    Number of Children: N/A   Years of Education: N/A   Social History Main Topics   Smoking status: Never Smoker    Smokeless tobacco: Not on file   Alcohol Use: Yes   Drug Use: No   Sexually Active: Not on file   Other Topics Concern   Not on file   Social History Narrative   No narrative on file     FAMILY HISTORY:  During the visit, a 4-generation pedigree was obtained. Significant diagnoses include the following:  Family History  Problem Relation Age of Onset   Breast cancer Maternal Aunt 9    ? bilateral   Cancer Mother 16     lung cancer    Cancer Father 61    possible breast cancer   Cancer Sister     lung cancer ? primary; paternal half sister   Cancer Maternal Grandmother 24    prostate cancer    Ms. Churchwell's ancestry is of Caucasian descent. There is no known Jewish ancestry or consanguinity.  GENETIC COUNSELING ASSESSMENT: [name] is a 79 y.o. female with a personal and family history of cancer, which includes a personal diagnosis of bilateral breast cancer, in combination with her father having a probable breast cancer diagnosis.[name] family history is somewhat suggestive of a hereditary predisposition to cancer and we, therefore, discussed and recommended the following at today's visit.   DISCUSSION: We reviewed the characteristics, features and inheritance patterns of hereditary cancer syndromes. We also discussed genetic testing, including the appropriate family members to test, the process of testing, insurance coverage and turn-around-time for results. We recommended [name] pursue genetic testing for BRCA1 and BRCA2.   PLAN: [name] wished to pursue genetic testing and the blood sample will be sent to [facility] for analysis of the BRCA1 and BRCA2 genes.  We discussed the implications of a positive, negative and/ or variant of uncertain significance  genetic test result. Results should be available within approximately 3 weeks time, at which point they will be disclosed by telephone to [name], as will any additional recommendations warranted by these results. [name] will receive a summary of her genetic counseling visit and a copy of her results once available. This information will also be available in Epic. We encouraged [name] to remain in contact with cancer genetics annually so that we can continuously update the family history and inform her of any changes in cancer genetics and testing that may be of benefit for this family. Ms.   Biscardi questions were answered to her satisfaction today. Our contact information was provided should additional questions or concerns arise. Thank you for the referral and allowing [place] to share in the care of your patient.   The patient was seen for a total of 50 minutes, greater than 50% of which was spent face-to-face counseling.  This patient was discussed with Dr. Drue Second and she agrees with the above.    _______________________________________________________________________ For Office Staff:  Number of people involved in session: 3 Was an Intern/ student involved with case: not applicable   2

## 2013-02-21 ENCOUNTER — Telehealth: Payer: Self-pay | Admitting: Oncology

## 2013-02-21 ENCOUNTER — Encounter: Payer: Self-pay | Admitting: Oncology

## 2013-02-21 ENCOUNTER — Ambulatory Visit (HOSPITAL_BASED_OUTPATIENT_CLINIC_OR_DEPARTMENT_OTHER): Payer: Medicare Other | Admitting: Oncology

## 2013-02-21 ENCOUNTER — Other Ambulatory Visit (HOSPITAL_BASED_OUTPATIENT_CLINIC_OR_DEPARTMENT_OTHER): Payer: Medicare Other | Admitting: Lab

## 2013-02-21 VITALS — BP 115/75 | HR 86 | Temp 98.3°F | Resp 20 | Ht 60.0 in | Wt 171.1 lb

## 2013-02-21 DIAGNOSIS — C50319 Malignant neoplasm of lower-inner quadrant of unspecified female breast: Secondary | ICD-10-CM

## 2013-02-21 DIAGNOSIS — Z170 Estrogen receptor positive status [ER+]: Secondary | ICD-10-CM

## 2013-02-21 DIAGNOSIS — C50311 Malignant neoplasm of lower-inner quadrant of right female breast: Secondary | ICD-10-CM

## 2013-02-21 LAB — CBC WITH DIFFERENTIAL/PLATELET
BASO%: 1.2 % (ref 0.0–2.0)
Basophils Absolute: 0.1 10*3/uL (ref 0.0–0.1)
EOS%: 4.2 % (ref 0.0–7.0)
HCT: 41.9 % (ref 34.8–46.6)
HGB: 14.2 g/dL (ref 11.6–15.9)
LYMPH%: 38.7 % (ref 14.0–49.7)
MCH: 30.7 pg (ref 25.1–34.0)
MCHC: 33.9 g/dL (ref 31.5–36.0)
MCV: 90.4 fL (ref 79.5–101.0)
NEUT%: 45.8 % (ref 38.4–76.8)
Platelets: 276 10*3/uL (ref 145–400)

## 2013-02-21 LAB — COMPREHENSIVE METABOLIC PANEL (CC13)
ALT: 19 U/L (ref 0–55)
AST: 16 U/L (ref 5–34)
BUN: 20.3 mg/dL (ref 7.0–26.0)
Calcium: 9 mg/dL (ref 8.4–10.4)
Creatinine: 1 mg/dL (ref 0.6–1.1)
Total Bilirubin: 0.65 mg/dL (ref 0.20–1.20)

## 2013-02-21 NOTE — Progress Notes (Signed)
OFFICE PROGRESS NOTE  CC  POLITE,RONALD D, MD [address] [place] [address] Dr. Emelia Loron Dr. Lonie Peak  DIAGNOSIS: 79 year old female who self palpated a mass in her right breast. 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  STAGE:  Cancer of lower-inner quadrant of female breast  Primary site: Breast (Right)  Staging method: AJCC 7th Edition  Clinical: Stage IIA (T2, N0, cM0)  Summary: Stage IIA (T2, N0, cM0)  PRIOR THERAPY:  #1Recently on self breast examination patient found a mass in the right breast. Because of this she underwent a mammogram ultrasound and eventually a biopsy. The biopsy showed a grade 1 invasive ductal carcinoma with DCIS. The tumor was ER +100% PR +100% HER-2/neu negative with a proliferation marker Ki-67 low at 15%. Patient went on to then have MRI of the breasts performed that showed a 4.1 cm area of enhancement. No other abnormalities were seen  2 patient was interested in breast conservation and she was begun on antiestrogen therapy neoadjuvant daily with Femara2.5 mg daily in January 2014.she is tolerating this well.  CURRENT THERAPY:Femara 2.5 mg daily since January 2014 in the neoadjuvant setting.  INTERVAL HISTORY: Wendy Roth 79 y.o. female returns for followup visit today. Overall she's doing well without any significant complaints. She denies any fevers chills night sweats headaches shortness of breath chest pains palpitations. She feels that she is tolerating the letrozole well without any significant problems.she is having some sexual difficulties we discussed this at great length today. We discussed the possibility of them: 46 therapy. She received a copy of the sexuality Book. She otherwise denies any nausea vomiting no aches or pains. Remainder of the 10 point review of systems is negative  MEDICAL HISTORY: Past Medical History   Diagnosis Date  . Breast cancer   . Hypertension   . Arthritis   . Back pain   . Fatigue   . Anxiety   . Depression     ALLERGIES:  has No Known Allergies.  MEDICATIONS:  Current Outpatient Prescriptions  Medication Sig Dispense Refill  . amLODipine (NORVASC) 5 MG tablet Take 5 mg by mouth daily.      Marland Kitchen aspirin 81 MG tablet Take 81 mg by mouth daily.      . clopidogrel (PLAVIX) 75 MG tablet Take 75 mg by mouth daily.      . DULoxetine (CYMBALTA) 30 MG capsule Take 60 mg by mouth 2 (two) times daily.       Marland Kitchen letrozole (FEMARA) 2.5 MG tablet Take 1 tablet (2.5 mg total) by mouth daily.  90 tablet  12  . levothyroxine (SYNTHROID, LEVOTHROID) 25 MCG tablet Take 25 mcg by mouth daily.      Marland Kitchen lisinopril (PRINIVIL,ZESTRIL) 20 MG tablet Take 20 mg by mouth daily.      . metoprolol succinate (TOPROL-XL) 50 MG 24 hr tablet Take 50 mg by mouth daily. Take with or immediately following a meal.      . ROPINIROLE HCL PO Take by mouth.      . temazepam (RESTORIL) 15 MG capsule Take 15 mg by mouth at bedtime as needed.      . ezetimibe-simvastatin (VYTORIN) 10-20 MG per tablet Take 1 tablet by mouth every other day. Taking every other day       No current facility-administered medications for this visit.    SURGICAL HISTORY:  Past Surgical History  Procedure  Laterality Date  . Tonsillectomy    . Abdominal hysterectomy    . Meniscus repair      left  . Angioplasty      x2  . Carpal tunnel release      bilateral  . Parathyroidectomy    . Breast lumpectomy      left x2    REVIEW OF SYSTEMS:  Pertinent items are noted in HPI.   HEALTH MAINTENANCE:  PHYSICAL EXAMINATION: Blood pressure 115/75, pulse 86, temperature 98.3 F (36.8 C), temperature source Oral, resp. rate 20, height 5' (1.524 m), weight 171 lb 1.6 oz (77.61 kg). Body mass index is 33.42 kg/(m^2). ECOG PERFORMANCE STATUS: 0 - Asymptomatic   General appearance: alert, cooperative and appears stated age Resp: clear to  auscultation bilaterally Back: symmetric, no curvature. ROM normal. No CVA tenderness. Cardio: regular rate and rhythm GI: soft, non-tender; bowel sounds normal; no masses,  no organomegaly Extremities: extremities normal, atraumatic, no cyanosis or edema Neurologic: Grossly normal   LABORATORY DATA: Lab Results  Component Value Date   WBC 6.5 02/21/2013   HGB 14.2 02/21/2013   HCT 41.9 02/21/2013   MCV 90.4 02/21/2013   PLT 276 02/21/2013      Chemistry      Component Value Date/Time   NA 143 02/21/2013 0905   K 4.0 02/21/2013 0905   CL 105 02/21/2013 0905   CO2 27 02/21/2013 0905   BUN 20.3 02/21/2013 0905   CREATININE 1.0 02/21/2013 0905      Component Value Date/Time   CALCIUM 9.0 02/21/2013 0905   ALKPHOS 37* 02/21/2013 0905   AST 16 02/21/2013 0905   ALT 19 02/21/2013 0905   BILITOT 0.65 02/21/2013 0905       RADIOGRAPHIC STUDIES:  No results found.  ASSESSMENT: 79 year old female with  #1 stage IIa invasive ductal carcinoma of the right breast diagnosed December 2013. She is on neoadjuvant treatment with letrozole 2.5 mg daily. So far she is tolerating it well without any significant problems.we will continue this for now.  #2 MRIs of the breast will be performed in June for evaluation of reduction in size of tumor with the letrozole.   PLAN:   #1 continue letrozole 2.5 mg on a daily basis.  #2 MRIs of the breasts to evaluate response to therapy in June  And i will see her back then  #3 we discussed her sexuality, she was given a copy of the sexuality handbook.  All questions were answered. The patient knows to call the clinic with any problems, questions or concerns. We can certainly see the patient much sooner if necessary.  I spent 15 minutes counseling the patient face to face. The total time spent in the appointment was 30 minutes.    Drue Second, MD Medical/Oncology [facility] [phone] (beeper) [phone] (Office)  02/21/2013,  9:59 AM

## 2013-02-21 NOTE — Patient Instructions (Addendum)
Proceed with MRI as scheduled  Continue the letrozole  I will see you back in 6/19

## 2013-02-24 ENCOUNTER — Other Ambulatory Visit: Payer: Self-pay | Admitting: Internal Medicine

## 2013-02-24 DIAGNOSIS — C50911 Malignant neoplasm of unspecified site of right female breast: Secondary | ICD-10-CM

## 2013-03-07 ENCOUNTER — Encounter: Payer: Self-pay | Admitting: Genetic Counselor

## 2013-03-22 ENCOUNTER — Ambulatory Visit
Admission: RE | Admit: 2013-03-22 | Discharge: 2013-03-22 | Disposition: A | Discharge status: Home / Self Care | Payer: Medicare Other | Source: Ambulatory Visit | Attending: General Surgery | Admitting: General Surgery

## 2013-03-22 DIAGNOSIS — C50311 Malignant neoplasm of lower-inner quadrant of right female breast: Secondary | ICD-10-CM

## 2013-03-22 MED ORDER — GADOBENATE DIMEGLUMINE 529 MG/ML IV SOLN
15.0000 mL | Freq: Once | INTRAVENOUS | Status: AC | PRN
  Administered 2013-03-22: 15 mL via INTRAVENOUS

## 2013-03-24 ENCOUNTER — Telehealth: Payer: Self-pay | Admitting: *Deleted

## 2013-03-24 ENCOUNTER — Encounter: Payer: Self-pay | Admitting: Oncology

## 2013-03-24 ENCOUNTER — Other Ambulatory Visit (HOSPITAL_BASED_OUTPATIENT_CLINIC_OR_DEPARTMENT_OTHER): Payer: Medicare Other | Admitting: Lab

## 2013-03-24 ENCOUNTER — Ambulatory Visit (HOSPITAL_BASED_OUTPATIENT_CLINIC_OR_DEPARTMENT_OTHER): Payer: Medicare Other | Admitting: Oncology

## 2013-03-24 VITALS — BP 159/73 | HR 82 | Temp 98.3°F | Resp 20 | Ht 60.0 in | Wt 168.9 lb

## 2013-03-24 DIAGNOSIS — C50319 Malignant neoplasm of lower-inner quadrant of unspecified female breast: Secondary | ICD-10-CM

## 2013-03-24 DIAGNOSIS — C50311 Malignant neoplasm of lower-inner quadrant of right female breast: Secondary | ICD-10-CM

## 2013-03-24 LAB — CBC WITH DIFFERENTIAL/PLATELET
BASO%: 1 % (ref 0.0–2.0)
Basophils Absolute: 0.1 10*3/uL (ref 0.0–0.1)
HCT: 43.7 % (ref 34.8–46.6)
HGB: 15 g/dL (ref 11.6–15.9)
LYMPH%: 34.3 % (ref 14.0–49.7)
MCH: 30.9 pg (ref 25.1–34.0)
MCHC: 34.5 g/dL (ref 31.5–36.0)
MONO#: 0.6 10*3/uL (ref 0.1–0.9)
NEUT%: 52.8 % (ref 38.4–76.8)
Platelets: 259 10*3/uL (ref 145–400)
WBC: 6.6 10*3/uL (ref 3.9–10.3)
lymph#: 2.3 10*3/uL (ref 0.9–3.3)

## 2013-03-24 NOTE — Telephone Encounter (Signed)
appts made and printed...td 

## 2013-03-24 NOTE — Patient Instructions (Addendum)
We discussed the MRI results. You have had a remarkable response to letrozole.  I have referred you back to Dr. Dwain Sarna  I will see you back in 4-6 months

## 2013-03-25 ENCOUNTER — Other Ambulatory Visit: Payer: Self-pay | Admitting: *Deleted

## 2013-03-30 ENCOUNTER — Telehealth (INDEPENDENT_AMBULATORY_CARE_PROVIDER_SITE_OTHER): Payer: Self-pay

## 2013-03-30 NOTE — Telephone Encounter (Signed)
Called pt to make her an appt with Dr Dwain Sarna to discuss br ca surgery. I made her an appt on 04/19/13 with Dr Dwain Sarna.

## 2013-04-01 ENCOUNTER — Other Ambulatory Visit: Payer: Self-pay | Admitting: *Deleted

## 2013-04-01 NOTE — Telephone Encounter (Signed)
Refill request to collaborative nurse desk to review with MD for approval.

## 2013-04-07 ENCOUNTER — Telehealth (INDEPENDENT_AMBULATORY_CARE_PROVIDER_SITE_OTHER): Payer: Self-pay

## 2013-04-07 NOTE — Telephone Encounter (Signed)
Pt returned my call. The pt will r/s her appt to 7/17 with Dr Dwain Sarna.

## 2013-04-07 NOTE — Telephone Encounter (Signed)
LMOM for pt to call me b/c Dr Dwain Sarna wants the pt to switch her appt from 04/19/13 to 04/21/13. Dr Dwain Sarna is going to need more time to speak to her about surgery than what I have her scheduled for on 04/19/13. I need the pt to come in on 04/21/13 arrive at 4:15 for 4:30 per Dr Doreen Salvage request. I have not switched the appt yet until I speak with the pt.

## 2013-04-13 DIAGNOSIS — I10 Essential (primary) hypertension: Secondary | ICD-10-CM | POA: Insufficient documentation

## 2013-04-13 DIAGNOSIS — M129 Arthropathy, unspecified: Secondary | ICD-10-CM

## 2013-04-13 DIAGNOSIS — G2581 Restless legs syndrome: Secondary | ICD-10-CM

## 2013-04-13 HISTORY — DX: Essential (primary) hypertension: I10

## 2013-04-13 HISTORY — DX: Arthropathy, unspecified: M12.9

## 2013-04-13 HISTORY — DX: Restless legs syndrome: G25.81

## 2013-04-17 NOTE — Progress Notes (Signed)
OFFICE PROGRESS NOTE  CC  POLITE,RONALD D, MD [address] [place] [address] Dr. Emelia Loron Dr. Lonie Peak  DIAGNOSIS: 79 year old female who self palpated a mass in her right breast. 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  STAGE:  Cancer of lower-inner quadrant of female breast  Primary site: Breast (Right)  Staging method: AJCC 7th Edition  Clinical: Stage IIA (T2, N0, cM0)  Summary: Stage IIA (T2, N0, cM0)  PRIOR THERAPY:  #1Recently on self breast examination patient found a mass in the right breast. Because of this she underwent a mammogram ultrasound and eventually a biopsy. The biopsy showed a grade 1 invasive ductal carcinoma with DCIS. The tumor was ER +100% PR +100% HER-2/neu negative with a proliferation marker Ki-67 low at 15%. Patient went on to then have MRI of the breasts performed that showed a 4.1 cm area of enhancement. No other abnormalities were seen  2 patient was interested in breast conservation and she was begun on antiestrogen therapy neoadjuvant daily with Femara2.5 mg daily in January 2014.she is tolerating this well.  CURRENT THERAPY:Femara 2.5 mg daily since January 2014 in the neoadjuvant setting.  INTERVAL HISTORY: Wendy Roth 79 y.o. female returns for followup visit today. Overall she's doing well without any significant complaints. She denies any fevers chills night sweats headaches shortness of breath chest pains palpitations. She feels that she is tolerating the letrozole well without any significant problems.she is having some sexual difficulties we discussed this at great length today. We discussed the possibility of them: 46 therapy. She received a copy of the sexuality Book. She otherwise denies any nausea vomiting no aches or pains. Remainder of the 10 point review of systems is negative  MEDICAL HISTORY: Past Medical History   Diagnosis Date  . Breast cancer   . Hypertension   . Arthritis   . Back pain   . Fatigue   . Anxiety   . Depression     ALLERGIES:  has No Known Allergies.  MEDICATIONS:  Current Outpatient Prescriptions  Medication Sig Dispense Refill  . amLODipine (NORVASC) 5 MG tablet Take 5 mg by mouth daily.      Marland Kitchen aspirin 81 MG tablet Take 81 mg by mouth daily.      . clonazePAM (KLONOPIN) 0.5 MG tablet Take 0.5 mg by mouth at bedtime as needed for anxiety.      . clopidogrel (PLAVIX) 75 MG tablet Take 75 mg by mouth daily.      . DULoxetine (CYMBALTA) 30 MG capsule Take 60 mg by mouth 2 (two) times daily.       Marland Kitchen ezetimibe-simvastatin (VYTORIN) 10-20 MG per tablet Take 1 tablet by mouth every other day. Taking every other day      . letrozole (FEMARA) 2.5 MG tablet Take 1 tablet (2.5 mg total) by mouth daily.  90 tablet  12  . levothyroxine (SYNTHROID, LEVOTHROID) 25 MCG tablet Take 25 mcg by mouth daily.      Marland Kitchen lisinopril (PRINIVIL,ZESTRIL) 20 MG tablet Take 20 mg by mouth daily.      . metoprolol succinate (TOPROL-XL) 50 MG 24 hr tablet Take 50 mg by mouth daily. Take with or immediately following a meal.      . ROPINIROLE HCL PO Take by mouth.       No current facility-administered medications for this visit.    SURGICAL HISTORY:  Past Surgical History  Procedure Laterality Date  . Tonsillectomy    . Abdominal hysterectomy    . Meniscus repair      left  . Angioplasty      x2  . Carpal tunnel release      bilateral  . Parathyroidectomy    . Breast lumpectomy      left x2    REVIEW OF SYSTEMS:  Pertinent items are noted in HPI.   HEALTH MAINTENANCE:  PHYSICAL EXAMINATION: Blood pressure 159/73, pulse 82, temperature 98.3 F (36.8 C), resp. rate 20, height 5' (1.524 m), weight 168 lb 14.4 oz (76.613 kg). Body mass index is 32.99 kg/(m^2). ECOG PERFORMANCE STATUS: 0 - Asymptomatic   General appearance: alert, cooperative and appears stated age Resp: clear to  auscultation bilaterally Back: symmetric, no curvature. ROM normal. No CVA tenderness. Cardio: regular rate and rhythm GI: soft, non-tender; bowel sounds normal; no masses,  no organomegaly Extremities: extremities normal, atraumatic, no cyanosis or edema Neurologic: Grossly normal   LABORATORY DATA: Lab Results  Component Value Date   WBC 6.6 03/24/2013   HGB 15.0 03/24/2013   HCT 43.7 03/24/2013   MCV 89.6 03/24/2013   PLT 259 03/24/2013      Chemistry      Component Value Date/Time   NA 143 02/21/2013 0905   K 4.0 02/21/2013 0905   CL 105 02/21/2013 0905   CO2 27 02/21/2013 0905   BUN 20.3 02/21/2013 0905   CREATININE 1.0 02/21/2013 0905      Component Value Date/Time   CALCIUM 9.0 02/21/2013 0905   ALKPHOS 37* 02/21/2013 0905   AST 16 02/21/2013 0905   ALT 19 02/21/2013 0905   BILITOT 0.65 02/21/2013 0905       RADIOGRAPHIC STUDIES:  No results found.  ASSESSMENT: 79 year old female with  #1 stage IIa invasive ductal carcinoma of the right breast diagnosed December 2013. She is on neoadjuvant treatment with letrozole 2.5 mg daily. So far she is tolerating it well without any significant problems.we will continue this for now.  #2 MRIs of the breast will be performed in June for evaluation of reduction in size of tumor with the letrozole.   PLAN:  #1We discussed the MRI results. You have had a remarkable response to letrozole.  #2 I have referred you back to Dr. Dwain Sarna  #3 I will see you back in 4-6 months   All questions were answered. The patient knows to call the clinic with any problems, questions or concerns. We can certainly see the patient much sooner if necessary.  I spent 25 minutes counseling the patient face to face. The total time spent in the appointment was 30 minutes.    Drue Second, MD Medical/Oncology [facility] [phone] (beeper) [phone] (Office)

## 2013-04-19 ENCOUNTER — Encounter (INDEPENDENT_AMBULATORY_CARE_PROVIDER_SITE_OTHER): Payer: Medicare Other | Admitting: General Surgery

## 2013-04-21 ENCOUNTER — Encounter (INDEPENDENT_AMBULATORY_CARE_PROVIDER_SITE_OTHER): Payer: Self-pay | Admitting: General Surgery

## 2013-04-21 ENCOUNTER — Ambulatory Visit (INDEPENDENT_AMBULATORY_CARE_PROVIDER_SITE_OTHER): Payer: Medicare Other | Admitting: General Surgery

## 2013-04-21 VITALS — BP 150/88 | HR 76 | Temp 98.0°F | Resp 16 | Ht 61.0 in | Wt 172.0 lb

## 2013-04-21 DIAGNOSIS — C50311 Malignant neoplasm of lower-inner quadrant of right female breast: Secondary | ICD-10-CM

## 2013-04-21 DIAGNOSIS — C50319 Malignant neoplasm of lower-inner quadrant of unspecified female breast: Secondary | ICD-10-CM

## 2013-04-21 NOTE — Progress Notes (Signed)
Patient ID: Wendy Roth, female   DOB: 24-Oct-1931, 79 y.o.   MRN: [id number]  Chief Complaint  Patient presents with  . Breast Cancer Long Term Follow Up    discuss br ca sx     HPI Wendy Roth is a 79 y.o. female.   HPI This is a 79 year old female who on her own self exam palpated a right breast mass and her skin being different above that. 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 She has done well until New Year's Eve when she noted his right breast mass. She states she has no nipple discharge tenderness or any other complaints. She has been on Plavix and she had a stent placed in 2005 and she is followed Dr. Donnie Aho. She underwent a mammogram in June which was negative. She then felt a mass and underwent a diagnostic right mammogram as well as ultrasound. The mammogram showed a vague ill-defined density in the right lower inner quadrant. This showed skin retraction as well as possible microcalcifications were also in the area. Ultrasound showed an ill-defined hypoechoic area measuring 1.6 x 1.2 x 1.2 cm with some overlying skin thickening. Sonography of the right axilla demonstrates normal lymph nodes. She has also now undergone an MRI which shows a 4.1 x 2.4 x 1.5 cm mass in the inferior right breast. She had mildly prominent nodes but these are normal on her ultrasound. There no abnormal appearing internal mammary left axillary nodes there is no evidence of malignancy on the left status post lumpectomy. The lesion underwent biopsy and this is shown to be an invasive ductal carcinoma with ductal carcinoma in situ. They appear to be grade 1. Estrogen and progesterone receptors are both positive at 100%. HER-2/neu is not amplified. Her proliferation markers 15%. She has undergone  neoadjuvant femara and does not feel any mass present at that site.  She has repeat mri that shows no more disease present.  She comes in today tolerating her femara well to discuss surgery.  Past Medical History  Diagnosis Date  . Breast cancer   . Hypertension   . Arthritis   . Back pain   . Fatigue   . Anxiety   . Depression     Past Surgical History  Procedure Laterality Date  . Tonsillectomy    . Abdominal hysterectomy    . Meniscus repair      left  . Angioplasty      x2  . Carpal tunnel release      bilateral  . Parathyroidectomy    . Breast lumpectomy      left x2    Family History  Problem Relation Age of Onset  . Breast cancer Maternal Aunt 29    ? bilateral  . Cancer Mother 35    lung cancer   . Cancer Father 25    possible breast cancer  . Cancer Sister     lung cancer ? primary; paternal half sister  . Cancer Maternal Grandmother 44    prostate cancer    Social History History  Substance Use Topics  . Smoking status: Never Smoker   . Smokeless tobacco: Not on file  . Alcohol Use: Yes    No Known Allergies  Current Outpatient Prescriptions  Medication Sig Dispense Refill  .  amLODipine (NORVASC) 5 MG tablet Take 5 mg by mouth daily.      Marland Kitchen aspirin 81 MG tablet Take 81 mg by mouth daily.      . clonazePAM (KLONOPIN) 0.5 MG tablet Take 0.5 mg by mouth at bedtime as needed for anxiety.      . clopidogrel (PLAVIX) 75 MG tablet Take 75 mg by mouth daily.      . DULoxetine (CYMBALTA) 30 MG capsule Take 60 mg by mouth 2 (two) times daily.       Marland Kitchen letrozole (FEMARA) 2.5 MG tablet Take 1 tablet (2.5 mg total) by mouth daily.  90 tablet  12  . levothyroxine (SYNTHROID, LEVOTHROID) 25 MCG tablet Take 25 mcg by mouth daily.      Marland Kitchen lisinopril (PRINIVIL,ZESTRIL) 20 MG tablet Take 20 mg by mouth daily.      . metoprolol succinate (TOPROL-XL) 50 MG 24 hr tablet Take 50 mg by mouth daily. Take with or immediately following a meal.      . ROPINIROLE HCL PO Take  by mouth.       No current facility-administered medications for this visit.    Review of Systems Review of Systems  Constitutional: Negative for fever, chills and unexpected weight change.  HENT: Negative for hearing loss, congestion, sore throat, trouble swallowing and voice change.   Eyes: Negative for visual disturbance.  Respiratory: Negative for cough and wheezing.   Cardiovascular: Negative for chest pain, palpitations and leg swelling.  Gastrointestinal: Negative for nausea, vomiting, abdominal pain, diarrhea, constipation, blood in stool, abdominal distention and anal bleeding.  Genitourinary: Negative for hematuria, vaginal bleeding and difficulty urinating.  Musculoskeletal: Negative for arthralgias.  Skin: Negative for rash and wound.  Neurological: Negative for seizures, syncope and headaches.  Hematological: Negative for adenopathy. Does not bruise/bleed easily.  Psychiatric/Behavioral: Negative for confusion.    Blood pressure 150/88, pulse 76, temperature 98 F (36.7 C), temperature source Temporal, resp. rate 16, height 5\' 1"  (1.549 m), weight 172 lb (78.019 kg).  Physical Exam Physical Exam  Vitals reviewed. Constitutional: She appears well-developed and well-nourished.  Cardiovascular: Normal rate, regular rhythm and normal heart sounds.   Pulmonary/Chest: Effort normal and breath sounds normal. She has no wheezes. She has no rales. Right breast exhibits no inverted nipple, no mass, no nipple discharge, no skin change and no tenderness. Left breast exhibits no inverted nipple, no mass, no nipple discharge, no skin change and no tenderness. Breasts are symmetrical.    Lymphadenopathy:    She has no cervical adenopathy.    She has no axillary adenopathy.       Right: No supraclavicular adenopathy present.       Left: No supraclavicular adenopathy present.    Data Reviewed BILATERAL BREAST MRI WITH AND WITHOUT CONTRAST  Technique: Multiplanar, multisequence MR  images of both breasts  were obtained prior to and following the intravenous administration  of 15ml of Multihance. Three dimensional images were evaluated at  the independent DynaCad workstation.  Comparison: Breast MRI dated 10/18/2012. Previous mammograms  dated 10/18/2012 on the left, 10/12/2012 on the right; 04/01/2012,  02/21/2011 bilaterally.  Findings: There is heterogeneous fibroglandular tissue. There is  moderate background parenchymal enhancement.  There is no longer any abnormal enhancement in the area of the  previously noted mass in the posterior inferior portion of the  right breast at approximately 6 o'clock. No new abnormal  enhancement is noted in either breast.  No adenopathy is identified.  IMPRESSION:  No residual  abnormal enhancement in the area of the previously  biopsied mass in the posterior inferior position of the right  breast found to represent invasive ductal carcinoma and ductal  carcinoma in situ. No new abnormality identified.    Assessment    Right breast invasive ductal cancer, on neoadjuvant therapy    Plan    Right breast wire guided lumpectomy We discussed options now and decided on lumpectomy.  I discussed sentinel node as possibility but I think it may be reasonable to omit this.  She would like to do this after September 6th when they have a trip planned.  I will schedule her and see her back. Will get periop clearance from Dr Donnie Aho as she will need to come off plavix for surgery also.        Mikiah Demond 04/21/2013, 5:18 PM

## 2013-04-22 ENCOUNTER — Encounter (INDEPENDENT_AMBULATORY_CARE_PROVIDER_SITE_OTHER): Payer: Self-pay

## 2013-04-26 ENCOUNTER — Telehealth (INDEPENDENT_AMBULATORY_CARE_PROVIDER_SITE_OTHER): Payer: Self-pay

## 2013-04-26 ENCOUNTER — Encounter: Payer: Self-pay | Admitting: Oncology

## 2013-04-26 NOTE — Telephone Encounter (Signed)
LM with pt's husband for pt to call me back so I can discuss her cardiac clearance. Dr York Spaniel office is requesting pt to be seen for appt before they will clear the pt for surgery. The pt will need to make an appt to see Dr Donnie Aho.

## 2013-05-11 ENCOUNTER — Other Ambulatory Visit: Payer: Self-pay

## 2013-06-28 ENCOUNTER — Encounter (HOSPITAL_COMMUNITY): Payer: Self-pay | Admitting: Respiratory Therapy

## 2013-06-28 ENCOUNTER — Ambulatory Visit (HOSPITAL_COMMUNITY)
Admission: RE | Admit: 2013-06-28 | Discharge: 2013-06-28 | Disposition: A | Discharge status: Home / Self Care | Payer: Medicare Other | Source: Ambulatory Visit | Attending: Anesthesiology | Admitting: Anesthesiology

## 2013-06-28 ENCOUNTER — Encounter (HOSPITAL_COMMUNITY)
Admission: RE | Admit: 2013-06-28 | Discharge: 2013-06-28 | Disposition: A | Discharge status: Home / Self Care | Payer: Medicare Other | Source: Ambulatory Visit | Attending: General Surgery | Admitting: General Surgery

## 2013-06-28 ENCOUNTER — Encounter (HOSPITAL_COMMUNITY): Payer: Self-pay

## 2013-06-28 DIAGNOSIS — Z01818 Encounter for other preprocedural examination: Secondary | ICD-10-CM | POA: Insufficient documentation

## 2013-06-28 DIAGNOSIS — Z01812 Encounter for preprocedural laboratory examination: Secondary | ICD-10-CM | POA: Insufficient documentation

## 2013-06-28 HISTORY — DX: Hypothyroidism, unspecified: E03.9

## 2013-06-28 HISTORY — DX: Personal history of urinary calculi: Z87.442

## 2013-06-28 HISTORY — DX: Type 2 diabetes mellitus without complications: E11.9

## 2013-06-28 LAB — CBC WITH DIFFERENTIAL/PLATELET
Basophils Relative: 0 % (ref 0–1)
Eosinophils Absolute: 0.2 10*3/uL (ref 0.0–0.7)
Eosinophils Relative: 2 % (ref 0–5)
HCT: 43.5 % (ref 36.0–46.0)
Hemoglobin: 15.2 g/dL — ABNORMAL HIGH (ref 12.0–15.0)
Lymphocytes Relative: 38 % (ref 12–46)
Lymphs Abs: 2.7 10*3/uL (ref 0.7–4.0)
MCH: 31.3 pg (ref 26.0–34.0)
MCV: 89.7 fL (ref 78.0–100.0)
Monocytes Absolute: 0.7 10*3/uL (ref 0.1–1.0)
Monocytes Relative: 10 % (ref 3–12)
Platelets: 275 10*3/uL (ref 150–400)
RBC: 4.85 MIL/uL (ref 3.87–5.11)
RDW: 13 % (ref 11.5–15.5)
WBC: 7 10*3/uL (ref 4.0–10.5)

## 2013-06-28 LAB — BASIC METABOLIC PANEL
BUN: 20 mg/dL (ref 6–23)
CO2: 31 mEq/L (ref 19–32)
Calcium: 9.6 mg/dL (ref 8.4–10.5)
Chloride: 98 mEq/L (ref 96–112)
Creatinine, Ser: 1.02 mg/dL (ref 0.50–1.10)

## 2013-06-28 NOTE — Pre-Procedure Instructions (Signed)
Wendy Roth  06/28/2013   Your procedure is scheduled on:  Tuesday, September 30th  Report to Main Entrance A once finished at breast center.  Call this number if you have problems the morning of surgery: [phone]   Remember:   Do not eat food or drink liquids after midnight.   Take these medicines the morning of surgery with A SIP OF WATER: Norvasc, Cymbalta, Synthroid, Toprol  Stop aspirin, plavix 5 days prior to surgery.   Do not wear jewelry, make-up or nail polish.  Do not wear lotions, powders, or perfume,deodorant.  Do not shave 48 hours prior to surgery. Men may shave face and neck.  Do not bring valuables to the hospital.  [facility] is not responsible for any belongings or valuables.  Contacts, dentures or bridgework may not be worn into surgery.  Leave suitcase in the car. After surgery it may be brought to your room.  For patients admitted to the hospital, checkout time is 11:00 AM the day of discharge.   Patients discharged the day of surgery will not be allowed to drive home.   Special Instructions: Shower using CHG 2 nights before surgery and the night before surgery.  If you shower the day of surgery use CHG.  Use special wash - you have one bottle of CHG for all showers.  You should use approximately 1/3 of the bottle for each shower.   Please read over the following fact sheets that you were given: Pain Booklet, Coughing and Deep Breathing and Surgical Site Infection Prevention

## 2013-06-28 NOTE — Progress Notes (Signed)
Primary physician - Dr. Darlina Guys - [place] [facility] - dr. Conni Slipper last week - will request Stress test 3 years ago - will request

## 2013-06-29 ENCOUNTER — Encounter (INDEPENDENT_AMBULATORY_CARE_PROVIDER_SITE_OTHER): Payer: Self-pay | Admitting: General Surgery

## 2013-06-29 ENCOUNTER — Encounter (INDEPENDENT_AMBULATORY_CARE_PROVIDER_SITE_OTHER): Payer: Self-pay

## 2013-06-29 ENCOUNTER — Ambulatory Visit (INDEPENDENT_AMBULATORY_CARE_PROVIDER_SITE_OTHER): Payer: Medicare Other | Admitting: General Surgery

## 2013-06-29 VITALS — BP 118/70 | HR 68 | Temp 97.7°F | Resp 14 | Ht 59.75 in | Wt 171.0 lb

## 2013-06-29 DIAGNOSIS — C50311 Malignant neoplasm of lower-inner quadrant of right female breast: Secondary | ICD-10-CM

## 2013-06-29 DIAGNOSIS — C50319 Malignant neoplasm of lower-inner quadrant of unspecified female breast: Secondary | ICD-10-CM

## 2013-06-29 NOTE — Progress Notes (Signed)
Patient ID: Wendy Roth, female   DOB: 1932/08/04, 79 y.o.   MRN: [id number]  Chief Complaint  Patient presents with  . Routine Post Op    pre op for 9/30 sx dae    HPI Wendy Roth is a 79 y.o. female.   HPI This is a 79 year old female who on her own self exam palpated a right breast mass and her skin being different above that. 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 She has done well until New Year's Eve when she noted his right breast mass. She states she has no nipple discharge tenderness or any other complaints. She has been on Plavix and she had a stent placed in 2005 and she is followed Dr. Donnie Aho. She underwent a mammogram in June which was negative. She then felt a mass and underwent a diagnostic right mammogram as well as ultrasound. The mammogram showed a vague ill-defined density in the right lower inner quadrant. This showed skin retraction as well as possible microcalcifications were also in the area. Ultrasound showed an ill-defined hypoechoic area measuring 1.6 x 1.2 x 1.2 cm with some overlying skin thickening. Sonography of the right axilla demonstrates normal lymph nodes. She has also now undergone an MRI which shows a 4.1 x 2.4 x 1.5 cm mass in the inferior right breast. She had mildly prominent nodes but these are normal on her ultrasound. There no abnormal appearing internal mammary left axillary nodes there is no evidence of malignancy on the left status post lumpectomy. The lesion underwent biopsy and this is shown to be an invasive ductal carcinoma with ductal carcinoma in situ. They appear to be grade 1. Estrogen and progesterone receptors are both positive at 100%. HER-2/neu is not amplified. Her proliferation markers 15%. She has undergone neoadjuvant femara  and does not feel any mass present at that site. She has repeat mri that shows no more disease present. She comes in today tolerating her femara well to discuss surgery except for hot flashes. She has recently returned from a trip to [place].  She has had some abdominal pain that is better with PPIs but has no other changes since last visit prior to surgery.  Past Medical History  Diagnosis Date  . Breast cancer   . Hypertension   . Arthritis   . Back pain   . Fatigue   . Anxiety   . Depression   . Hypothyroidism   . Diabetes mellitus without complication     diet controlled.   Marland Kitchen History of kidney stones     Past Surgical History  Procedure Laterality Date  . Tonsillectomy    . Abdominal hysterectomy    . Meniscus repair      left  . Angioplasty      x2  . Carpal tunnel release      bilateral  . Parathyroidectomy    . Breast lumpectomy      left x2  . Coronary angioplasty  2005    stent  . Eye surgery Bilateral     cataracts    Family History  Problem Relation Age of Onset  . Breast cancer Maternal Aunt 68    ? bilateral  . Cancer Mother 47    lung cancer   . Cancer Father 34  possible breast cancer  . Cancer Sister     lung cancer ? primary; paternal half sister  . Cancer Maternal Grandmother 66    prostate cancer    Social History History  Substance Use Topics  . Smoking status: Never Smoker   . Smokeless tobacco: Never Used  . Alcohol Use: Yes     Comment: socially    No Known Allergies  Current Outpatient Prescriptions  Medication Sig Dispense Refill  . amLODipine (NORVASC) 5 MG tablet Take 5 mg by mouth daily.      Marland Kitchen aspirin 81 MG tablet Take 81 mg by mouth daily.      . clonazePAM (KLONOPIN) 0.5 MG tablet Take 0.5 mg by mouth at bedtime as needed for anxiety.      . clopidogrel (PLAVIX) 75 MG tablet Take 75 mg by mouth daily.      . Cyanocobalamin (B-12 PO) Take 1 tablet by mouth daily.      . DULoxetine (CYMBALTA) 60 MG capsule Take 60 mg  by mouth 2 (two) times daily.      Marland Kitchen ezetimibe-simvastatin (VYTORIN) 10-20 MG per tablet Take 1 tablet by mouth at bedtime.      . famotidine (PEPCID) 20 MG tablet Take 20 mg by mouth 2 (two) times daily.      Marland Kitchen letrozole (FEMARA) 2.5 MG tablet Take 1 tablet (2.5 mg total) by mouth daily.  90 tablet  12  . levothyroxine (SYNTHROID, LEVOTHROID) 25 MCG tablet Take 25 mcg by mouth daily.      . metoprolol succinate (TOPROL-XL) 50 MG 24 hr tablet Take 50 mg by mouth daily. Take with or immediately following a meal.      . rOPINIRole (REQUIP) 0.25 MG tablet Take 0.5 mg by mouth at bedtime.       No current facility-administered medications for this visit.    Review of Systems Review of Systems  Constitutional: Negative for fever, chills and unexpected weight change.  HENT: Negative for hearing loss, congestion, sore throat, trouble swallowing and voice change.   Eyes: Negative for visual disturbance.  Respiratory: Negative for cough and wheezing.   Cardiovascular: Negative for chest pain, palpitations and leg swelling.  Gastrointestinal: Negative for nausea, vomiting, abdominal pain, diarrhea, constipation, blood in stool, abdominal distention and anal bleeding.  Genitourinary: Negative for hematuria, vaginal bleeding and difficulty urinating.  Musculoskeletal: Negative for arthralgias.  Skin: Negative for rash and wound.  Neurological: Negative for seizures, syncope and headaches.  Hematological: Negative for adenopathy. Does not bruise/bleed easily.  Psychiatric/Behavioral: Negative for confusion.    Blood pressure 118/70, pulse 68, temperature 97.7 F (36.5 C), temperature source Temporal, resp. rate 14, height 4' 11.75" (1.518 m), weight 171 lb (77.565 kg).  Physical Exam Physical Exam  Constitutional: She appears well-developed and well-nourished.  Cardiovascular: Normal rate, regular rhythm and normal heart sounds.   Pulmonary/Chest: Effort normal and breath sounds normal. She has  no wheezes. Right breast exhibits no inverted nipple, no mass, no nipple discharge, no skin change and no tenderness. Left breast exhibits no mass and no nipple discharge.    Lymphadenopathy:    She has no cervical adenopathy.   BILATERAL BREAST MRI WITH AND WITHOUT CONTRAST  Technique: Multiplanar, multisequence MR images of both breasts  were obtained prior to and following the intravenous administration  of 15ml of Multihance. Three dimensional images were evaluated at  the independent DynaCad workstation.  Comparison: Breast MRI dated 10/18/2012. Previous mammograms  dated 10/18/2012 on the left, 10/12/2012  on the right; 04/01/2012,  02/21/2011 bilaterally.  Findings: There is heterogeneous fibroglandular tissue. There is  moderate background parenchymal enhancement.  There is no longer any abnormal enhancement in the area of the  previously noted mass in the posterior inferior portion of the  right breast at approximately 6 o'clock. No new abnormal  enhancement is noted in either breast.  No adenopathy is identified.  IMPRESSION:  No residual abnormal enhancement in the area of the previously  biopsied mass in the posterior inferior position of the right  breast found to represent invasive ductal carcinoma and ductal  carcinoma in situ. No new abnormality identified.   Assessment    Right breast cancer     Plan    Right breast wire guided lumpectomy  We discussed again today a right breast wire-guided lumpectomy. We discussed the risk of a positive margin required reexcision. We also discussed the other risks of surgery including bleeding and infection. We have discussed at multidisciplinary fashion the past not pursuing a sentinel lymph node biopsy for treatment. She understands this rationale I will proceed to take her to surgery next week.       Mayerly Kaman 06/29/2013, 1:16 PM

## 2013-07-04 MED ORDER — CEFAZOLIN SODIUM-DEXTROSE 2-3 GM-% IV SOLR
2.0000 g | INTRAVENOUS | Status: AC
  Administered 2013-07-05: 2 g via INTRAVENOUS
  Filled 2013-07-04: qty 50

## 2013-07-05 ENCOUNTER — Ambulatory Visit (HOSPITAL_COMMUNITY)
Admission: RE | Admit: 2013-07-05 | Discharge: 2013-07-05 | Disposition: A | Discharge status: Home / Self Care | Payer: Medicare Other | Source: Ambulatory Visit | Attending: General Surgery | Admitting: General Surgery

## 2013-07-05 ENCOUNTER — Encounter (HOSPITAL_COMMUNITY)
Admission: RE | Disposition: A | Discharge status: Home / Self Care | Payer: Self-pay | Source: Ambulatory Visit | Attending: General Surgery

## 2013-07-05 ENCOUNTER — Encounter (HOSPITAL_COMMUNITY): Payer: Self-pay | Admitting: Anesthesiology

## 2013-07-05 ENCOUNTER — Ambulatory Visit (HOSPITAL_COMMUNITY): Payer: Medicare Other | Admitting: Anesthesiology

## 2013-07-05 ENCOUNTER — Ambulatory Visit
Admission: RE | Admit: 2013-07-05 | Discharge: 2013-07-05 | Disposition: A | Discharge status: Home / Self Care | Payer: Medicare Other | Source: Ambulatory Visit | Attending: General Surgery | Admitting: General Surgery

## 2013-07-05 DIAGNOSIS — C50311 Malignant neoplasm of lower-inner quadrant of right female breast: Secondary | ICD-10-CM

## 2013-07-05 DIAGNOSIS — D0590 Unspecified type of carcinoma in situ of unspecified breast: Secondary | ICD-10-CM | POA: Insufficient documentation

## 2013-07-05 DIAGNOSIS — C50919 Malignant neoplasm of unspecified site of unspecified female breast: Secondary | ICD-10-CM

## 2013-07-05 HISTORY — PX: BREAST LUMPECTOMY WITH NEEDLE LOCALIZATION: SHX5759

## 2013-07-05 LAB — GLUCOSE, CAPILLARY: Glucose-Capillary: 128 mg/dL — ABNORMAL HIGH (ref 70–99)

## 2013-07-05 SURGERY — BREAST LUMPECTOMY WITH NEEDLE LOCALIZATION
Anesthesia: General | Laterality: Right

## 2013-07-05 MED ORDER — HYDROCODONE-ACETAMINOPHEN 10-325 MG PO TABS: 1.0000 | ORAL_TABLET | Freq: Four times a day (QID) | ORAL | Status: DC | PRN

## 2013-07-05 MED ORDER — ALBUMIN HUMAN 5 % IV SOLN
INTRAVENOUS | Status: DC | PRN
  Administered 2013-07-05: 10:00:00 via INTRAVENOUS

## 2013-07-05 MED ORDER — PHENYLEPHRINE HCL 10 MG/ML IJ SOLN
INTRAMUSCULAR | Status: DC | PRN
  Administered 2013-07-05 (×6): 80 ug via INTRAVENOUS

## 2013-07-05 MED ORDER — METOPROLOL SUCCINATE ER 50 MG PO TB24
50.0000 mg | ORAL_TABLET | Freq: Every day | ORAL | Status: DC
  Administered 2013-07-05: 50 mg via ORAL
  Filled 2013-07-05 (×2): qty 1

## 2013-07-05 MED ORDER — 0.9 % SODIUM CHLORIDE (POUR BTL) OPTIME
TOPICAL | Status: DC | PRN
  Administered 2013-07-05: 1000 mL

## 2013-07-05 MED ORDER — LACTATED RINGERS IV SOLN
INTRAVENOUS | Status: DC | PRN
  Administered 2013-07-05: 10:00:00 via INTRAVENOUS

## 2013-07-05 MED ORDER — PROPOFOL 10 MG/ML IV BOLUS
INTRAVENOUS | Status: DC | PRN
  Administered 2013-07-05: 150 mg via INTRAVENOUS

## 2013-07-05 MED ORDER — FENTANYL CITRATE 0.05 MG/ML IJ SOLN
INTRAMUSCULAR | Status: DC | PRN
  Administered 2013-07-05 (×2): 50 ug via INTRAVENOUS

## 2013-07-05 MED ORDER — ARTIFICIAL TEARS OP OINT
TOPICAL_OINTMENT | OPHTHALMIC | Status: DC | PRN
  Administered 2013-07-05: 1 via OPHTHALMIC

## 2013-07-05 MED ORDER — BUPIVACAINE-EPINEPHRINE PF 0.25-1:200000 % IJ SOLN
INTRAMUSCULAR | Status: AC
  Filled 2013-07-05: qty 30

## 2013-07-05 MED ORDER — LACTATED RINGERS IV SOLN
INTRAVENOUS | Status: DC
  Administered 2013-07-05: 50 mL/h via INTRAVENOUS

## 2013-07-05 MED ORDER — ONDANSETRON HCL 4 MG/2ML IJ SOLN
INTRAMUSCULAR | Status: DC | PRN
  Administered 2013-07-05: 4 mg via INTRAVENOUS

## 2013-07-05 MED ORDER — LIDOCAINE HCL (CARDIAC) 20 MG/ML IV SOLN
INTRAVENOUS | Status: DC | PRN
  Administered 2013-07-05: 100 mg via INTRAVENOUS

## 2013-07-05 SURGICAL SUPPLY — 51 items
ADH SKN CLS APL DERMABOND .7 (GAUZE/BANDAGES/DRESSINGS) ×1
APPLIER CLIP 9.375 MED OPEN (MISCELLANEOUS) ×2
APR CLP MED 9.3 20 MLT OPN (MISCELLANEOUS) ×1
BINDER BREAST LRG (GAUZE/BANDAGES/DRESSINGS) ×1 IMPLANT
BINDER BREAST XLRG (GAUZE/BANDAGES/DRESSINGS) IMPLANT
BLADE SURG 15 STRL LF DISP TIS (BLADE) ×1 IMPLANT
BLADE SURG 15 STRL SS (BLADE) ×2
CANISTER SUCTION 2500CC (MISCELLANEOUS) IMPLANT
CHLORAPREP W/TINT 26ML (MISCELLANEOUS) ×2 IMPLANT
CLIP APPLIE 9.375 MED OPEN (MISCELLANEOUS) IMPLANT
CLOTH BEACON ORANGE TIMEOUT ST (SAFETY) ×2 IMPLANT
CLSR STERI-STRIP ANTIMIC 1/2X4 (GAUZE/BANDAGES/DRESSINGS) ×1 IMPLANT
COVER SURGICAL LIGHT HANDLE (MISCELLANEOUS) ×2 IMPLANT
DECANTER SPIKE VIAL GLASS SM (MISCELLANEOUS) ×2 IMPLANT
DERMABOND ADVANCED (GAUZE/BANDAGES/DRESSINGS) ×1
DERMABOND ADVANCED .7 DNX12 (GAUZE/BANDAGES/DRESSINGS) ×1 IMPLANT
DEVICE DUBIN SPECIMEN MAMMOGRA (MISCELLANEOUS) ×2 IMPLANT
DRAPE CHEST BREAST 15X10 FENES (DRAPES) ×2 IMPLANT
ELECT CAUTERY BLADE 6.4 (BLADE) ×2 IMPLANT
ELECT REM PT RETURN 9FT ADLT (ELECTROSURGICAL) ×2
ELECTRODE REM PT RTRN 9FT ADLT (ELECTROSURGICAL) ×1 IMPLANT
GLOVE BIO SURGEON STRL SZ 6.5 (GLOVE) ×1 IMPLANT
GLOVE BIO SURGEON STRL SZ7 (GLOVE) ×2 IMPLANT
GLOVE BIOGEL PI IND STRL 6.5 (GLOVE) IMPLANT
GLOVE BIOGEL PI IND STRL 7.5 (GLOVE) ×1 IMPLANT
GLOVE BIOGEL PI INDICATOR 6.5 (GLOVE) ×1
GLOVE BIOGEL PI INDICATOR 7.5 (GLOVE) ×1
GOWN STRL NON-REIN LRG LVL3 (GOWN DISPOSABLE) ×4 IMPLANT
KIT BASIN OR (CUSTOM PROCEDURE TRAY) ×2 IMPLANT
KIT MARKER MARGIN INK (KITS) ×1 IMPLANT
KIT ROOM TURNOVER OR (KITS) ×2 IMPLANT
NDL HYPO 25GX1X1/2 BEV (NEEDLE) ×1 IMPLANT
NEEDLE HYPO 25GX1X1/2 BEV (NEEDLE) ×2 IMPLANT
NS IRRIG 1000ML POUR BTL (IV SOLUTION) ×2 IMPLANT
PACK SURGICAL SETUP 50X90 (CUSTOM PROCEDURE TRAY) ×2 IMPLANT
PAD ARMBOARD 7.5X6 YLW CONV (MISCELLANEOUS) ×2 IMPLANT
PENCIL BUTTON HOLSTER BLD 10FT (ELECTRODE) ×2 IMPLANT
SPONGE LAP 18X18 X RAY DECT (DISPOSABLE) ×2 IMPLANT
STAPLER VISISTAT 35W (STAPLE) ×2 IMPLANT
SUT MNCRL AB 4-0 PS2 18 (SUTURE) ×1 IMPLANT
SUT SILK 2 0 SH (SUTURE) ×1 IMPLANT
SUT VIC AB 2-0 SH 27 (SUTURE) ×2
SUT VIC AB 2-0 SH 27XBRD (SUTURE) ×1 IMPLANT
SUT VIC AB 3-0 SH 27 (SUTURE) ×2
SUT VIC AB 3-0 SH 27X BRD (SUTURE) ×1 IMPLANT
SYR BULB 3OZ (MISCELLANEOUS) ×2 IMPLANT
SYR CONTROL 10ML LL (SYRINGE) ×2 IMPLANT
TOWEL OR 17X24 6PK STRL BLUE (TOWEL DISPOSABLE) ×2 IMPLANT
TOWEL OR 17X26 10 PK STRL BLUE (TOWEL DISPOSABLE) ×2 IMPLANT
TUBE CONNECTING 12X1/4 (SUCTIONS) ×1 IMPLANT
YANKAUER SUCT BULB TIP NO VENT (SUCTIONS) ×1 IMPLANT

## 2013-07-05 NOTE — Transfer of Care (Signed)
Immediate Anesthesia Transfer of Care Note  Patient: Wendy Roth  Procedure(s) Performed: Procedure(s): RIGHT BREAST WIRE GUIDED LUMPECTOMY  (Right)  Patient Location: PACU  Anesthesia Type:General  Level of Consciousness: awake, alert , oriented and sedated  Airway & Oxygen Therapy: Patient Spontanous Breathing and Patient connected to nasal cannula oxygen  Post-op Assessment: Report given to PACU RN, Post -op Vital signs reviewed and stable and Patient moving all extremities  Post vital signs: Reviewed and stable  Complications: No apparent anesthesia complications

## 2013-07-05 NOTE — H&P (View-Only) (Signed)
Patient ID: UNIQUA KIHN, female   DOB: 08/22/1932, 79 y.o.   MRN: [id number]  Chief Complaint  Patient presents with  . Routine Post Op    pre op for 9/30 sx dae    HPI MAYUMI SUMMERSON is a 79 y.o. female.   HPI This is a 79 year old female who on her own self exam palpated a right breast mass and her skin being different above that. 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 She has done well until New Year's Eve when she noted his right breast mass. She states she has no nipple discharge tenderness or any other complaints. She has been on Plavix and she had a stent placed in 2005 and she is followed Dr. Donnie Aho. She underwent a mammogram in June which was negative. She then felt a mass and underwent a diagnostic right mammogram as well as ultrasound. The mammogram showed a vague ill-defined density in the right lower inner quadrant. This showed skin retraction as well as possible microcalcifications were also in the area. Ultrasound showed an ill-defined hypoechoic area measuring 1.6 x 1.2 x 1.2 cm with some overlying skin thickening. Sonography of the right axilla demonstrates normal lymph nodes. She has also now undergone an MRI which shows a 4.1 x 2.4 x 1.5 cm mass in the inferior right breast. She had mildly prominent nodes but these are normal on her ultrasound. There no abnormal appearing internal mammary left axillary nodes there is no evidence of malignancy on the left status post lumpectomy. The lesion underwent biopsy and this is shown to be an invasive ductal carcinoma with ductal carcinoma in situ. They appear to be grade 1. Estrogen and progesterone receptors are both positive at 100%. HER-2/neu is not amplified. Her proliferation markers 15%. She has undergone neoadjuvant femara  and does not feel any mass present at that site. She has repeat mri that shows no more disease present. She comes in today tolerating her femara well to discuss surgery except for hot flashes. She has recently returned from a trip to [place].  She has had some abdominal pain that is better with PPIs but has no other changes since last visit prior to surgery.  Past Medical History  Diagnosis Date  . Breast cancer   . Hypertension   . Arthritis   . Back pain   . Fatigue   . Anxiety   . Depression   . Hypothyroidism   . Diabetes mellitus without complication     diet controlled.   Marland Kitchen History of kidney stones     Past Surgical History  Procedure Laterality Date  . Tonsillectomy    . Abdominal hysterectomy    . Meniscus repair      left  . Angioplasty      x2  . Carpal tunnel release      bilateral  . Parathyroidectomy    . Breast lumpectomy      left x2  . Coronary angioplasty  2005    stent  . Eye surgery Bilateral     cataracts    Family History  Problem Relation Age of Onset  . Breast cancer Maternal Aunt 74    ? bilateral  . Cancer Mother 54    lung cancer   . Cancer Father 86  possible breast cancer  . Cancer Sister     lung cancer ? primary; paternal half sister  . Cancer Maternal Grandmother 76    prostate cancer    Social History History  Substance Use Topics  . Smoking status: Never Smoker   . Smokeless tobacco: Never Used  . Alcohol Use: Yes     Comment: socially    No Known Allergies  Current Outpatient Prescriptions  Medication Sig Dispense Refill  . amLODipine (NORVASC) 5 MG tablet Take 5 mg by mouth daily.      Marland Kitchen aspirin 81 MG tablet Take 81 mg by mouth daily.      . clonazePAM (KLONOPIN) 0.5 MG tablet Take 0.5 mg by mouth at bedtime as needed for anxiety.      . clopidogrel (PLAVIX) 75 MG tablet Take 75 mg by mouth daily.      . Cyanocobalamin (B-12 PO) Take 1 tablet by mouth daily.      . DULoxetine (CYMBALTA) 60 MG capsule Take 60 mg  by mouth 2 (two) times daily.      Marland Kitchen ezetimibe-simvastatin (VYTORIN) 10-20 MG per tablet Take 1 tablet by mouth at bedtime.      . famotidine (PEPCID) 20 MG tablet Take 20 mg by mouth 2 (two) times daily.      Marland Kitchen letrozole (FEMARA) 2.5 MG tablet Take 1 tablet (2.5 mg total) by mouth daily.  90 tablet  12  . levothyroxine (SYNTHROID, LEVOTHROID) 25 MCG tablet Take 25 mcg by mouth daily.      . metoprolol succinate (TOPROL-XL) 50 MG 24 hr tablet Take 50 mg by mouth daily. Take with or immediately following a meal.      . rOPINIRole (REQUIP) 0.25 MG tablet Take 0.5 mg by mouth at bedtime.       No current facility-administered medications for this visit.    Review of Systems Review of Systems  Constitutional: Negative for fever, chills and unexpected weight change.  HENT: Negative for hearing loss, congestion, sore throat, trouble swallowing and voice change.   Eyes: Negative for visual disturbance.  Respiratory: Negative for cough and wheezing.   Cardiovascular: Negative for chest pain, palpitations and leg swelling.  Gastrointestinal: Negative for nausea, vomiting, abdominal pain, diarrhea, constipation, blood in stool, abdominal distention and anal bleeding.  Genitourinary: Negative for hematuria, vaginal bleeding and difficulty urinating.  Musculoskeletal: Negative for arthralgias.  Skin: Negative for rash and wound.  Neurological: Negative for seizures, syncope and headaches.  Hematological: Negative for adenopathy. Does not bruise/bleed easily.  Psychiatric/Behavioral: Negative for confusion.    Blood pressure 118/70, pulse 68, temperature 97.7 F (36.5 C), temperature source Temporal, resp. rate 14, height 4' 11.75" (1.518 m), weight 171 lb (77.565 kg).  Physical Exam Physical Exam  Constitutional: She appears well-developed and well-nourished.  Cardiovascular: Normal rate, regular rhythm and normal heart sounds.   Pulmonary/Chest: Effort normal and breath sounds normal. She has  no wheezes. Right breast exhibits no inverted nipple, no mass, no nipple discharge, no skin change and no tenderness. Left breast exhibits no mass and no nipple discharge.    Lymphadenopathy:    She has no cervical adenopathy.   BILATERAL BREAST MRI WITH AND WITHOUT CONTRAST  Technique: Multiplanar, multisequence MR images of both breasts  were obtained prior to and following the intravenous administration  of 15ml of Multihance. Three dimensional images were evaluated at  the independent DynaCad workstation.  Comparison: Breast MRI dated 10/18/2012. Previous mammograms  dated 10/18/2012 on the left, 10/12/2012  on the right; 04/01/2012,  02/21/2011 bilaterally.  Findings: There is heterogeneous fibroglandular tissue. There is  moderate background parenchymal enhancement.  There is no longer any abnormal enhancement in the area of the  previously noted mass in the posterior inferior portion of the  right breast at approximately 6 o'clock. No new abnormal  enhancement is noted in either breast.  No adenopathy is identified.  IMPRESSION:  No residual abnormal enhancement in the area of the previously  biopsied mass in the posterior inferior position of the right  breast found to represent invasive ductal carcinoma and ductal  carcinoma in situ. No new abnormality identified.   Assessment    Right breast cancer     Plan    Right breast wire guided lumpectomy  We discussed again today a right breast wire-guided lumpectomy. We discussed the risk of a positive margin required reexcision. We also discussed the other risks of surgery including bleeding and infection. We have discussed at multidisciplinary fashion the past not pursuing a sentinel lymph node biopsy for treatment. She understands this rationale I will proceed to take her to surgery next week.       Daley Gosse 06/29/2013, 1:16 PM

## 2013-07-05 NOTE — Op Note (Signed)
Preoperative diagnosis: Clinical stage II right breast cancer status post primary endocrine therapy Postoperative diagnosis: Same as above Procedure: Right breast wire-guided lumpectomy Surgeon: Dr. Harden Mo Anesthesia: Gen. Estimated blood loss: Minimal Complications: None Drains: None Specimens: Right breast marked with paint, additional medial margin marked short stitch superior, long stitch lateral, double stitch deep Disposition to recovery Sponge count correct at completion  Indications: This is a 79 year old female who was diagnosed with a right breast cancer. She has a prior history of a left breast cancer. There is about a 4 cm area on MRI upon her initial diagnosis. This was ER and PR positive. She began on antiestrogen therapy and by MRI as well as on her exam it has completely resolved. There is only a clip left in place. We discussed going to the operating room for lumpectomy with the possibility of further margins requiring additional surgery.  Procedure: After informed consent was obtained the patient first had a wire placed at the breast center. These mammograms in the operating room. She had sequential compression devices on her legs. She was given cefazolin. She underwent general anesthesia with an LMA. Her right breast was prepped and draped in the standard sterile surgical fashion. A surgical timeout was performed.  I made a curvilinear incision overlying the end of the wire. This about 5 cm from the wire insertion. I then used cautery to dissect around the wire as well as making an attempt to get a clear margin. I brought the wire in from its remote position. I then removed this tissue and marked it with paint. I then did a mammogram which confirmed removal of the wire and the lesion. This was confirmed by radiology. The clip was in the area as well. I did take an additional medial margin as this was close on the initial mammogram. I took more distal to the wire as this  tissue felt abnormal. I then obtained hemostasis. I placed clips around the cavity. I closed the breast tissue with a 2-0 Vicryl. The dermis was closed with 3-0 Vicryl. The skin was closed for Monocryl. dermabond and steristrips were placed. I infiltrated Marcaine throughout this area. A breast binder was placed. She tolerated this well was extubated and transferred to recovery stable.

## 2013-07-05 NOTE — Anesthesia Preprocedure Evaluation (Addendum)
Anesthesia Evaluation  Patient identified by MRN, date of birth, ID band Patient awake    Reviewed: Allergy & Precautions, H&P , NPO status , Patient's Chart, lab work & pertinent test results  Airway Mallampati: II TM Distance: >3 FB Neck ROM: Full    Dental  (+) Dental Advisory Given   Pulmonary          Cardiovascular hypertension, Pt. on medications and Pt. on home beta blockers     Neuro/Psych PSYCHIATRIC DISORDERS Anxiety Depression    GI/Hepatic   Endo/Other  diabetesHypothyroidism   Renal/GU      Musculoskeletal   Abdominal   Peds  Hematology   Anesthesia Other Findings   Reproductive/Obstetrics                          Anesthesia Physical Anesthesia Plan  ASA: II  Anesthesia Plan: General   Post-op Pain Management:    Induction: Intravenous  Airway Management Planned: LMA  Additional Equipment:   Intra-op Plan:   Post-operative Plan: Extubation in OR  Informed Consent: I have reviewed the patients History and Physical, chart, labs and discussed the procedure including the risks, benefits and alternatives for the proposed anesthesia with the patient or authorized representative who has indicated his/her understanding and acceptance.   Dental advisory given  Plan Discussed with: CRNA and Surgeon  Anesthesia Plan Comments:        Anesthesia Quick Evaluation

## 2013-07-05 NOTE — Anesthesia Postprocedure Evaluation (Signed)
Anesthesia Post Note  Patient: Wendy Roth  Procedure(s) Performed: Procedure(s) (LRB): RIGHT BREAST WIRE GUIDED LUMPECTOMY  (Right)  Anesthesia type: general  Patient location: PACU  Post pain: Pain level controlled  Post assessment: Patient's Cardiovascular Status Stable  Last Vitals:  Filed Vitals:   07/05/13 1256  BP: 142/64  Pulse: 68  Temp: 36.8 C  Resp: 14    Post vital signs: Reviewed and stable  Level of consciousness: sedated  Complications: No apparent anesthesia complications

## 2013-07-05 NOTE — Interval H&P Note (Signed)
History and Physical Interval Note:  07/05/2013 9:49 AM  Wendy Roth  has presented today for surgery, with the diagnosis of right breast cancer  The various methods of treatment have been discussed with the patient and family. After consideration of risks, benefits and other options for treatment, the patient has consented to  Procedure(s): RIGHT BREAST WIRE GUIDED LUMPECTOMY  (Right) as a surgical intervention .  The patient's history has been reviewed, patient examined, no change in status, stable for surgery.  I have reviewed the patient's chart and labs.  Questions were answered to the patient's satisfaction.     Margrete Delude

## 2013-07-05 NOTE — Preoperative (Signed)
Beta Blockers   Reason not to administer Beta Blockers:Not Applicable 

## 2013-07-08 ENCOUNTER — Encounter (HOSPITAL_COMMUNITY): Payer: Self-pay | Admitting: General Surgery

## 2013-07-19 ENCOUNTER — Other Ambulatory Visit (INDEPENDENT_AMBULATORY_CARE_PROVIDER_SITE_OTHER): Payer: Self-pay | Admitting: General Surgery

## 2013-07-19 ENCOUNTER — Telehealth (INDEPENDENT_AMBULATORY_CARE_PROVIDER_SITE_OTHER): Payer: Self-pay

## 2013-07-19 ENCOUNTER — Encounter (INDEPENDENT_AMBULATORY_CARE_PROVIDER_SITE_OTHER): Payer: Medicare Other | Admitting: General Surgery

## 2013-07-19 NOTE — Telephone Encounter (Signed)
Message copied by Ethlyn Gallery on Tue Jul 19, 2013  9:45 AM ------      Message from: Mervin Kung      Created: Tue Jul 19, 2013  8:50 AM       Pt called she states that she could not make it to office this morning due to traffic but wants Dr Dwain Sarna to know she would like to get surgery scheduled she doesn't feel like she needs a consult to tell him yes we are gonna do SX.Marland Kitchen She is scheduled for next Tuesday.       sonya ------

## 2013-07-19 NOTE — Telephone Encounter (Signed)
Called pt to let her know that Dr Dwain Sarna is working on the surgical orders and our surgery schedulers will be calling her to get scheduled. The pt is doing ok and wants to cancel the appt for 07/25/13 so I did cancel the appt with Dr Dwain Sarna for Tuesday. The pt understands and will wait to hear from our surgery schedulers.

## 2013-07-20 ENCOUNTER — Other Ambulatory Visit (INDEPENDENT_AMBULATORY_CARE_PROVIDER_SITE_OTHER): Payer: Self-pay | Admitting: General Surgery

## 2013-07-25 ENCOUNTER — Encounter (INDEPENDENT_AMBULATORY_CARE_PROVIDER_SITE_OTHER): Payer: Medicare Other | Admitting: General Surgery

## 2013-07-25 ENCOUNTER — Ambulatory Visit (INDEPENDENT_AMBULATORY_CARE_PROVIDER_SITE_OTHER): Payer: Medicare Other | Admitting: General Surgery

## 2013-07-25 ENCOUNTER — Encounter (INDEPENDENT_AMBULATORY_CARE_PROVIDER_SITE_OTHER): Payer: Self-pay | Admitting: General Surgery

## 2013-07-25 ENCOUNTER — Ambulatory Visit: Payer: Medicare Other | Admitting: Oncology

## 2013-07-25 VITALS — BP 140/74 | HR 64 | Temp 98.4°F | Resp 14 | Ht 59.75 in | Wt 168.2 lb

## 2013-07-25 DIAGNOSIS — Z09 Encounter for follow-up examination after completed treatment for conditions other than malignant neoplasm: Secondary | ICD-10-CM

## 2013-07-25 NOTE — Progress Notes (Signed)
Subjective:     Patient ID: Wendy Roth, female   DOB: [birth year], 79 y.o.   MRN: [id number]  HPI 68 yof who underwent primary endocrine therapy for a right breast cancer. She had a good result by MRI. She recently underwent a lumpectomy that showed a 2.2 cm invasive ductal carcinoma. This was close at the medial margin. I did excise that medial margin. This one is clear. Her anterior margin is focally positive. This is just the skin but I would like to take her back to the operating room and remove some of the skin just to make sure this margin is clear. She comes in today after missing her last appointment and complains that her right breast is swollen. She has no fever. She has no drainage from her incision. She has no other symptoms except for some increase in size in her right breast.  Review of Systems     Objective:   Physical Exam Healing right breast incision without infection, right breast is somewhat edematous more so than the left side There is no infection present though.    Assessment:     Status post right breast lumpectomy with positive margin     Plan:     She is going to wear her compression garment. I will plan on seeing her next week for surgery. We discussed her pathology and I provided him a copy of this today. We'll plan on re\re excise this margin next week.

## 2013-07-26 ENCOUNTER — Encounter (HOSPITAL_COMMUNITY): Payer: Self-pay | Admitting: Pharmacy Technician

## 2013-07-27 NOTE — Pre-Procedure Instructions (Signed)
CHARDONAY SCRITCHFIELD  07/27/2013   Your procedure is scheduled on:  Monday, Oct. 27th   Report to [facility]  2 * 3 at  5:30  AM.  Call this number if you have problems the morning of surgery: [phone]   Remember:   Do not eat food or drink liquids after midnight Sunday.   Take these medicines the morning of surgery with A SIP OF WATER: Norvasc, Cymbalta, Levothyroxine, Metoprolol   Do not wear jewelry, make-up or nail polish.  Do not wear lotions, powders, or perfumes. You may wear deodorant.  Do not shave 48 hours prior to surgery.    Do not bring valuables to the hospital.  [facility] is not responsible for any belongings or valuables.               Contacts, dentures or bridgework may not be worn into surgery.  Leave suitcase in the car. After surgery it may be brought to your room.              Patients discharged the day of surgery will not be allowed to drive home.   Name and phone number of your driver:    Special Instructions: Shower using CHG 2 nights before surgery and the night before surgery.  If you shower the day of surgery use CHG.  Use special wash - you have one bottle of CHG for all showers.  You should use approximately 1/3 of the bottle for each shower.   Please read over the following fact sheets that you were given: Pain Booklet and Surgical Site Infection Prevention

## 2013-07-28 ENCOUNTER — Encounter (HOSPITAL_COMMUNITY): Payer: Self-pay

## 2013-07-28 ENCOUNTER — Encounter (HOSPITAL_COMMUNITY)
Admission: RE | Admit: 2013-07-28 | Discharge: 2013-07-28 | Disposition: A | Discharge status: Home / Self Care | Payer: Medicare Other | Source: Ambulatory Visit | Attending: General Surgery | Admitting: General Surgery

## 2013-07-28 DIAGNOSIS — Z01812 Encounter for preprocedural laboratory examination: Secondary | ICD-10-CM | POA: Insufficient documentation

## 2013-07-28 LAB — COMPREHENSIVE METABOLIC PANEL
AST: 19 U/L (ref 0–37)
Albumin: 3.5 g/dL (ref 3.5–5.2)
BUN: 18 mg/dL (ref 6–23)
CO2: 27 mEq/L (ref 19–32)
Calcium: 9.3 mg/dL (ref 8.4–10.5)
Chloride: 102 mEq/L (ref 96–112)
Creatinine, Ser: 1.11 mg/dL — ABNORMAL HIGH (ref 0.50–1.10)
GFR calc Af Amer: 53 mL/min — ABNORMAL LOW (ref >90)
GFR calc non Af Amer: 46 mL/min — ABNORMAL LOW (ref >90)
Total Bilirubin: 0.3 mg/dL (ref 0.3–1.2)

## 2013-07-28 LAB — CBC
HCT: 41.6 % (ref 36.0–46.0)
MCH: 30.7 pg (ref 26.0–34.0)
MCHC: 33.9 g/dL (ref 30.0–36.0)
MCV: 90.4 fL (ref 78.0–100.0)
Platelets: 279 10*3/uL (ref 150–400)
RBC: 4.6 MIL/uL (ref 3.87–5.11)
RDW: 13 % (ref 11.5–15.5)
WBC: 7.4 10*3/uL (ref 4.0–10.5)

## 2013-07-28 MED ORDER — ONDANSETRON HCL 4 MG/2ML IJ SOLN: 4.0000 mg | Freq: Once | INTRAMUSCULAR | Status: AC | PRN

## 2013-07-28 MED ORDER — OXYCODONE HCL 5 MG/5ML PO SOLN: 5.0000 mg | Freq: Once | ORAL | Status: AC | PRN

## 2013-07-28 MED ORDER — MEPERIDINE HCL 25 MG/ML IJ SOLN: 6.2500 mg | INTRAMUSCULAR | Status: DC | PRN

## 2013-07-28 MED ORDER — OXYCODONE HCL 5 MG PO TABS: 5.0000 mg | ORAL_TABLET | Freq: Once | ORAL | Status: AC | PRN

## 2013-07-28 MED ORDER — HYDROMORPHONE HCL PF 1 MG/ML IJ SOLN: 0.2500 mg | INTRAMUSCULAR | Status: DC | PRN

## 2013-07-28 NOTE — Progress Notes (Signed)
REQUESTED EKG FROM DR. TILLEY'S OFFICE...DA

## 2013-07-31 MED ORDER — CEFAZOLIN SODIUM-DEXTROSE 2-3 GM-% IV SOLR
2.0000 g | INTRAVENOUS | Status: AC
  Administered 2013-08-01: 2 g via INTRAVENOUS
  Filled 2013-07-31: qty 50

## 2013-08-01 ENCOUNTER — Encounter (HOSPITAL_COMMUNITY): Payer: Self-pay | Admitting: *Deleted

## 2013-08-01 ENCOUNTER — Ambulatory Visit (HOSPITAL_COMMUNITY)
Admission: RE | Admit: 2013-08-01 | Discharge: 2013-08-01 | Disposition: A | Discharge status: Home / Self Care | Payer: Medicare Other | Source: Ambulatory Visit | Attending: General Surgery | Admitting: General Surgery

## 2013-08-01 ENCOUNTER — Ambulatory Visit (HOSPITAL_COMMUNITY): Payer: Medicare Other | Admitting: Anesthesiology

## 2013-08-01 ENCOUNTER — Encounter (HOSPITAL_COMMUNITY): Payer: Medicare Other | Admitting: Anesthesiology

## 2013-08-01 ENCOUNTER — Encounter (HOSPITAL_COMMUNITY)
Admission: RE | Disposition: A | Discharge status: Home / Self Care | Payer: Self-pay | Source: Ambulatory Visit | Attending: General Surgery

## 2013-08-01 DIAGNOSIS — C50919 Malignant neoplasm of unspecified site of unspecified female breast: Secondary | ICD-10-CM

## 2013-08-01 DIAGNOSIS — I10 Essential (primary) hypertension: Secondary | ICD-10-CM | POA: Insufficient documentation

## 2013-08-01 HISTORY — PX: RE-EXCISION OF BREAST CANCER,SUPERIOR MARGINS: SHX6047

## 2013-08-01 LAB — GLUCOSE, CAPILLARY: Glucose-Capillary: 161 mg/dL — ABNORMAL HIGH (ref 70–99)

## 2013-08-01 SURGERY — RE-EXCISION OF BREAST CANCER,SUPERIOR MARGINS
Anesthesia: General | Site: Breast | Laterality: Right | Wound class: Clean

## 2013-08-01 MED ORDER — LIDOCAINE HCL (CARDIAC) 20 MG/ML IV SOLN
INTRAVENOUS | Status: DC | PRN
  Administered 2013-08-01: 50 mg via INTRAVENOUS

## 2013-08-01 MED ORDER — PHENYLEPHRINE HCL 10 MG/ML IJ SOLN
INTRAMUSCULAR | Status: DC | PRN
  Administered 2013-08-01 (×2): 80 ug via INTRAVENOUS

## 2013-08-01 MED ORDER — FENTANYL CITRATE 0.05 MG/ML IJ SOLN
INTRAMUSCULAR | Status: DC | PRN
  Administered 2013-08-01 (×2): 25 ug via INTRAVENOUS

## 2013-08-01 MED ORDER — LACTATED RINGERS IV SOLN
INTRAVENOUS | Status: DC | PRN
  Administered 2013-08-01: 07:00:00 via INTRAVENOUS

## 2013-08-01 MED ORDER — SODIUM CHLORIDE 0.9 % IV SOLN: INTRAVENOUS | Status: DC

## 2013-08-01 MED ORDER — ONDANSETRON HCL 4 MG/2ML IJ SOLN: 4.0000 mg | Freq: Four times a day (QID) | INTRAMUSCULAR | Status: DC | PRN

## 2013-08-01 MED ORDER — OXYCODONE HCL 5 MG PO TABS
5.0000 mg | ORAL_TABLET | ORAL | Status: DC | PRN
  Administered 2013-08-01: 5 mg via ORAL

## 2013-08-01 MED ORDER — BUPIVACAINE-EPINEPHRINE PF 0.25-1:200000 % IJ SOLN
INTRAMUSCULAR | Status: AC
  Filled 2013-08-01: qty 30

## 2013-08-01 MED ORDER — MORPHINE SULFATE 2 MG/ML IJ SOLN: 2.0000 mg | INTRAMUSCULAR | Status: DC | PRN

## 2013-08-01 MED ORDER — ONDANSETRON HCL 4 MG/2ML IJ SOLN
INTRAMUSCULAR | Status: DC | PRN
  Administered 2013-08-01: 4 mg via INTRAVENOUS

## 2013-08-01 MED ORDER — BUPIVACAINE-EPINEPHRINE 0.25% -1:200000 IJ SOLN
INTRAMUSCULAR | Status: DC | PRN
  Administered 2013-08-01: 20 mL

## 2013-08-01 MED ORDER — EPHEDRINE SULFATE 50 MG/ML IJ SOLN
INTRAMUSCULAR | Status: DC | PRN
  Administered 2013-08-01: 15 mg via INTRAVENOUS
  Administered 2013-08-01: 10 mg via INTRAVENOUS

## 2013-08-01 MED ORDER — 0.9 % SODIUM CHLORIDE (POUR BTL) OPTIME
TOPICAL | Status: DC | PRN
  Administered 2013-08-01: 1000 mL

## 2013-08-01 MED ORDER — PROPOFOL 10 MG/ML IV BOLUS
INTRAVENOUS | Status: DC | PRN
  Administered 2013-08-01: 150 mg via INTRAVENOUS

## 2013-08-01 MED ORDER — OXYCODONE HCL 5 MG PO TABS: 5.0000 mg | ORAL_TABLET | Freq: Four times a day (QID) | ORAL | Status: DC | PRN

## 2013-08-01 MED ORDER — OXYCODONE HCL 5 MG PO TABS
ORAL_TABLET | ORAL | Status: AC
  Filled 2013-08-01: qty 1

## 2013-08-01 SURGICAL SUPPLY — 57 items
ADH SKN CLS APL DERMABOND .7 (GAUZE/BANDAGES/DRESSINGS) ×1
APL SKNCLS STERI-STRIP NONHPOA (GAUZE/BANDAGES/DRESSINGS)
APPLIER CLIP 9.375 MED OPEN (MISCELLANEOUS)
APR CLP MED 9.3 20 MLT OPN (MISCELLANEOUS)
BENZOIN TINCTURE PRP APPL 2/3 (GAUZE/BANDAGES/DRESSINGS) ×1 IMPLANT
BINDER BREAST LRG (GAUZE/BANDAGES/DRESSINGS) IMPLANT
BINDER BREAST XLRG (GAUZE/BANDAGES/DRESSINGS) ×1 IMPLANT
BLADE SURG 15 STRL LF DISP TIS (BLADE) ×1 IMPLANT
BLADE SURG 15 STRL SS (BLADE) ×2
BLADE SURG ROTATE 9660 (MISCELLANEOUS) IMPLANT
CANISTER SUCTION 2500CC (MISCELLANEOUS) ×1 IMPLANT
CHLORAPREP W/TINT 26ML (MISCELLANEOUS) ×2 IMPLANT
CLIP APPLIE 9.375 MED OPEN (MISCELLANEOUS) IMPLANT
CONT SPEC STER OR (MISCELLANEOUS) ×1 IMPLANT
COVER SURGICAL LIGHT HANDLE (MISCELLANEOUS) ×2 IMPLANT
DECANTER SPIKE VIAL GLASS SM (MISCELLANEOUS) ×1 IMPLANT
DERMABOND ADVANCED (GAUZE/BANDAGES/DRESSINGS) ×1
DERMABOND ADVANCED .7 DNX12 (GAUZE/BANDAGES/DRESSINGS) IMPLANT
DEVICE DUBIN SPECIMEN MAMMOGRA (MISCELLANEOUS) IMPLANT
DRAPE PED LAPAROTOMY (DRAPES) ×2 IMPLANT
DRSG OPSITE 4X5.5 SM (GAUZE/BANDAGES/DRESSINGS) ×1 IMPLANT
ELECT CAUTERY BLADE 6.4 (BLADE) ×2 IMPLANT
ELECT REM PT RETURN 9FT ADLT (ELECTROSURGICAL) ×2
ELECTRODE REM PT RTRN 9FT ADLT (ELECTROSURGICAL) ×1 IMPLANT
GLOVE BIO SURGEON STRL SZ7 (GLOVE) ×2 IMPLANT
GLOVE BIOGEL PI IND STRL 7.0 (GLOVE) IMPLANT
GLOVE BIOGEL PI IND STRL 7.5 (GLOVE) ×1 IMPLANT
GLOVE BIOGEL PI INDICATOR 7.0 (GLOVE) ×1
GLOVE BIOGEL PI INDICATOR 7.5 (GLOVE) ×1
GLOVE SS BIOGEL STRL SZ 6.5 (GLOVE) IMPLANT
GLOVE SUPERSENSE BIOGEL SZ 6.5 (GLOVE) ×1
GOWN STRL NON-REIN LRG LVL3 (GOWN DISPOSABLE) ×4 IMPLANT
KIT BASIN OR (CUSTOM PROCEDURE TRAY) ×2 IMPLANT
KIT MARKER MARGIN INK (KITS) IMPLANT
KIT ROOM TURNOVER OR (KITS) ×2 IMPLANT
NDL HYPO 25GX1X1/2 BEV (NEEDLE) ×1 IMPLANT
NEEDLE HYPO 25GX1X1/2 BEV (NEEDLE) ×2 IMPLANT
NS IRRIG 1000ML POUR BTL (IV SOLUTION) ×2 IMPLANT
PACK SURGICAL SETUP 50X90 (CUSTOM PROCEDURE TRAY) ×2 IMPLANT
PAD ARMBOARD 7.5X6 YLW CONV (MISCELLANEOUS) ×3 IMPLANT
PENCIL BUTTON HOLSTER BLD 10FT (ELECTRODE) ×2 IMPLANT
SPONGE GAUZE 4X4 12PLY (GAUZE/BANDAGES/DRESSINGS) ×1 IMPLANT
SPONGE LAP 4X18 X RAY DECT (DISPOSABLE) ×2 IMPLANT
STRIP CLOSURE SKIN 1/2X4 (GAUZE/BANDAGES/DRESSINGS) ×2 IMPLANT
SUT MNCRL AB 4-0 PS2 18 (SUTURE) ×2 IMPLANT
SUT SILK 2 0 SH (SUTURE) ×1 IMPLANT
SUT VIC AB 2-0 SH 27 (SUTURE) ×2
SUT VIC AB 2-0 SH 27X BRD (SUTURE) ×1 IMPLANT
SUT VIC AB 3-0 SH 27 (SUTURE) ×2
SUT VIC AB 3-0 SH 27XBRD (SUTURE) ×1 IMPLANT
SYR BULB 3OZ (MISCELLANEOUS) ×2 IMPLANT
SYR CONTROL 10ML LL (SYRINGE) ×2 IMPLANT
TOWEL OR 17X24 6PK STRL BLUE (TOWEL DISPOSABLE) ×2 IMPLANT
TOWEL OR 17X26 10 PK STRL BLUE (TOWEL DISPOSABLE) ×2 IMPLANT
TUBE CONNECTING 12X1/4 (SUCTIONS) IMPLANT
WATER STERILE IRR 1000ML POUR (IV SOLUTION) IMPLANT
YANKAUER SUCT BULB TIP NO VENT (SUCTIONS) ×1 IMPLANT

## 2013-08-01 NOTE — Transfer of Care (Signed)
Immediate Anesthesia Transfer of Care Note  Patient: Wendy Roth  Procedure(s) Performed: Procedure(s): RE-EXCISION OF BREAST CANCER MARGIN (Right)  Patient Location: PACU  Anesthesia Type:General  Level of Consciousness: awake, alert  and oriented  Airway & Oxygen Therapy: Patient Spontanous Breathing and Patient connected to nasal cannula oxygen  Post-op Assessment: Report given to PACU RN, Post -op Vital signs reviewed and stable and Patient moving all extremities X 4  Post vital signs: Reviewed and stable  Complications: No apparent anesthesia complications

## 2013-08-01 NOTE — Preoperative (Signed)
Beta Blockers   Reason not to administer Beta Blockers:Not Applicable 

## 2013-08-01 NOTE — Anesthesia Postprocedure Evaluation (Signed)
  Anesthesia Post-op Note  Patient: Wendy Roth  Procedure(s) Performed: Procedure(s): RE-EXCISION OF BREAST CANCER MARGIN (Right)  Patient Location: PACU  Anesthesia Type:General  Level of Consciousness: awake  Airway and Oxygen Therapy: Patient Spontanous Breathing  Post-op Pain: mild  Post-op Assessment: Post-op Vital signs reviewed  Post-op Vital Signs: Reviewed  Complications: No apparent anesthesia complications

## 2013-08-01 NOTE — Interval H&P Note (Signed)
History and Physical Interval Note:  08/01/2013 7:03 AM  Wendy Roth  has presented today for surgery, with the diagnosis of right breast cancer  The various methods of treatment have been discussed with the patient and family. After consideration of risks, benefits and other options for treatment, the patient has consented to  Procedure(s): RE-EXCISION OF BREAST CANCER MARGIN (Right) as a surgical intervention .  The patient's history has been reviewed, patient examined, no change in status, stable for surgery.  I have reviewed the patient's chart and labs.  Questions were answered to the patient's satisfaction.     Oscar Forman

## 2013-08-01 NOTE — Anesthesia Preprocedure Evaluation (Addendum)
Anesthesia Evaluation  Patient identified by MRN, date of birth, ID band Patient awake    Reviewed: Allergy & Precautions, H&P , NPO status , Patient's Chart, lab work & pertinent test results, reviewed documented beta blocker date and time   Airway Mallampati: II TM Distance: >3 FB Neck ROM: full    Dental  (+) Poor Dentition, Partial Upper and Dental Advidsory Given   Pulmonary neg pulmonary ROS,  breath sounds clear to auscultation        Cardiovascular hypertension, On Home Beta Blockers Rhythm:regular     Neuro/Psych PSYCHIATRIC DISORDERS    GI/Hepatic negative GI ROS, Neg liver ROS,   Endo/Other  negative endocrine ROSdiabetes, Type 2Hypothyroidism   Renal/GU negative Renal ROS     Musculoskeletal   Abdominal   Peds  Hematology   Anesthesia Other Findings   Reproductive/Obstetrics                          Anesthesia Physical Anesthesia Plan  ASA: II  Anesthesia Plan: General   Post-op Pain Management:    Induction: Intravenous  Airway Management Planned:   Additional Equipment:   Intra-op Plan:   Post-operative Plan: Extubation in OR  Informed Consent: I have reviewed the patients History and Physical, chart, labs and discussed the procedure including the risks, benefits and alternatives for the proposed anesthesia with the patient or authorized representative who has indicated his/her understanding and acceptance.   Dental Advisory Given and Dental advisory given  Plan Discussed with: Surgeon, Anesthesiologist and CRNA  Anesthesia Plan Comments:        Anesthesia Quick Evaluation

## 2013-08-01 NOTE — H&P (View-Only) (Signed)
Subjective:     Patient ID: Wendy Roth, female   DOB: 06/06/1932, 79 y.o.   MRN: [id number]  HPI 81 yof who underwent primary endocrine therapy for a right breast cancer. She had a good result by MRI. She recently underwent a lumpectomy that showed a 2.2 cm invasive ductal carcinoma. This was close at the medial margin. I did excise that medial margin. This one is clear. Her anterior margin is focally positive. This is just the skin but I would like to take her back to the operating room and remove some of the skin just to make sure this margin is clear. She comes in today after missing her last appointment and complains that her right breast is swollen. She has no fever. She has no drainage from her incision. She has no other symptoms except for some increase in size in her right breast.  Review of Systems     Objective:   Physical Exam Healing right breast incision without infection, right breast is somewhat edematous more so than the left side There is no infection present though.    Assessment:     Status post right breast lumpectomy with positive margin     Plan:     She is going to wear her compression garment. I will plan on seeing her next week for surgery. We discussed her pathology and I provided him a copy of this today. We'll plan on re\re excise this margin next week.       

## 2013-08-01 NOTE — Op Note (Signed)
Preoperative diagnosis: right breast cancer s/p neoadjuvant endocrine therapy with positive margin Postoperative diagnosis: same as above Procedure: re-excision right breast anterior margin Surgeon: Dr. Harden Mo Anesthesia: Gen. Estimated blood loss: Minimal Drains: None Specimens: Right breast anterior margin including skin marked short stitch superior, long stitch lateral, double stitch deep Complications: None Sponge and needle count was correct at the end Disposition to recovery stable  Indications: This is  a 79 year old female with a prior history of left breast cancer remotely who presented with a  right breast cancer noted on mammogram as well as MRI. This was biopsied was invasive ductal carcinoma that is hormone receptor positive. She very much wanted to undergo breast conservation therapy so we began her on primary endocrine therapy with letrozole. This area shrunk and it is not even noticeable by MRI. I took her to the operating room and performed a lumpectomy. She did not want to undergo sentinel lymph node biopsy. Her lumpectomy specimen as a focally-positive anterior margin which is primarily her skin. We discussed all her options and decided to go back and reexcise the anterior margin that cavity including the skin that was overlying the tumor.  Procedure: After informed consent was obtained the patient was taken to the operating room. She was given 2 g of cefazolin. Sequential compression devices were on her legs. She was placed under general anesthesia with LMA. Her right breast was prepped and draped in the standard sterile surgical fashion. A surgical timeout was then performed.  An elliptical incision was made that encompassed her old incision. This included the skin that was overlying her prior tumor. I then used cautery to excise the entire anterior margin. I identified my clips and the prior cavity after releasing the sutures I placed to close the cavity down. This was  then marked as above. Hemostasis was obtained. Irrigation was performed. I then closed the cavity loosely with 2-0 Vicryl suture. The dermis closed with 3-0 Vicryl and the skin with 4-0 Monocryl. Dermabond and Steri-Strips were placed on the incision. I injected 20 cc of Marcaine throughout the cavity. A breast binder was placed. She tolerated this well was extubated and transferred to the recovery room stable.

## 2013-08-01 NOTE — Anesthesia Procedure Notes (Signed)
Procedure Name: LMA Insertion Date/Time: 08/01/2013 7:28 AM Performed by: Carmela Rima Pre-anesthesia Checklist: Patient identified, Timeout performed, Emergency Drugs available, Suction available and Patient being monitored Patient Re-evaluated:Patient Re-evaluated prior to inductionOxygen Delivery Method: Circle system utilized Preoxygenation: Pre-oxygenation with 100% oxygen Intubation Type: IV induction Ventilation: Mask ventilation without difficulty LMA: LMA inserted LMA Size: 4.0 Tube type: Oral Number of attempts: 1 Placement Confirmation: positive ETCO2 and breath sounds checked- equal and bilateral Tube secured with: Tape Dental Injury: Teeth and Oropharynx as per pre-operative assessment

## 2013-08-02 ENCOUNTER — Encounter (HOSPITAL_COMMUNITY): Payer: Self-pay | Admitting: General Surgery

## 2013-08-02 ENCOUNTER — Telehealth: Payer: Self-pay | Admitting: Oncology

## 2013-08-03 ENCOUNTER — Telehealth (INDEPENDENT_AMBULATORY_CARE_PROVIDER_SITE_OTHER): Payer: Self-pay | Admitting: *Deleted

## 2013-08-03 NOTE — Telephone Encounter (Signed)
I called pt to check on her postoperatively.  Pt is having no complaints, minimal pain.  I informed pt of her po appt with Dr. Dwain Sarna on 08/16/13 with an arrival time of 11:15am.  I instructed pt to call our office if she has any questions or concerns.

## 2013-08-04 ENCOUNTER — Telehealth (INDEPENDENT_AMBULATORY_CARE_PROVIDER_SITE_OTHER): Payer: Self-pay

## 2013-08-04 NOTE — Telephone Encounter (Signed)
Called pt to notify her that her pathology report shows no more tumor and she will not need anymore surgery per Dr Dwain Sarna. The pt understands.

## 2013-08-11 ENCOUNTER — Other Ambulatory Visit: Payer: Self-pay

## 2013-08-16 ENCOUNTER — Encounter (INDEPENDENT_AMBULATORY_CARE_PROVIDER_SITE_OTHER): Payer: Self-pay | Admitting: General Surgery

## 2013-08-16 ENCOUNTER — Ambulatory Visit (INDEPENDENT_AMBULATORY_CARE_PROVIDER_SITE_OTHER): Payer: Medicare Other | Admitting: General Surgery

## 2013-08-16 VITALS — BP 128/76 | HR 88 | Temp 97.6°F | Resp 16 | Ht 59.75 in | Wt 169.6 lb

## 2013-08-16 DIAGNOSIS — Z09 Encounter for follow-up examination after completed treatment for conditions other than malignant neoplasm: Secondary | ICD-10-CM

## 2013-08-16 NOTE — Patient Instructions (Signed)
      ABC CLASS After Breast Cancer Class  After Breast Cancer Class is a specially designed exercise class to assist you in a safe recovery after having breast cancer surgery.  In this class you will learn how to get back to full function whether your drains were just removed or if you had surgery a month ago.  This one-time class is held the 1st and 3rd Monday of every month from 11:00 a.m. until 12:00 noon at the [facility] located at [address]  This class is FREE and space is limited.  For more information or to register for the next available class, call [phone].  Class Goals   Understand specific stretches to improve the flexibility of your chest and shoulder.   Learn ways to safely strengthen your upper body and improve your posture.   Understand the warning signs of infection and why you may be at risk for an arm infection.   Learn about Lymphedema and prevention.  **You do not attend this class until after surgery.  Drains must be removed to participate.     Donna Salisbury, PT, CLT Marti Smith, PT, CLT        

## 2013-08-16 NOTE — Progress Notes (Signed)
Subjective:     Patient ID: Wendy Roth, female   DOB: Sep 22, 1932, 79 y.o.   MRN: [id number]  HPI Subjective:   Patient ID: Wendy Roth, female DOB: 1931-12-25, 79 y.o. MRN: [id number]  HPI  85 yof who underwent primary endocrine therapy for a right breast cancer. She had a good result by MRI. She recently underwent a lumpectomy that showed a 2.2 cm invasive ductal carcinoma. This was close at the medial margin. I did excise that medial margin. This one is clear. Her anterior margin is focally positive. I have taken her back now and re-excised this with no further cancer.  She has done really well since this surgery and returns today for follow up without complaint.   Review of Systems     Objective:   Physical Exam Healing right breast incision without infection, no edema    Assessment:     S/p lumpectomy     Plan:     We discussed her pathology results today. She is due to see medical oncology again soon. I told her she could be released to full activity. I gave her information on the after breast cancer physical therapy class today as well. I will plan on seeing her back in 6 months or sooner if needed.

## 2013-09-15 DIAGNOSIS — J343 Hypertrophy of nasal turbinates: Secondary | ICD-10-CM

## 2013-09-15 DIAGNOSIS — E119 Type 2 diabetes mellitus without complications: Secondary | ICD-10-CM | POA: Insufficient documentation

## 2013-09-15 HISTORY — DX: Type 2 diabetes mellitus without complications: E11.9

## 2013-09-15 HISTORY — DX: Hypertrophy of nasal turbinates: J34.3

## 2013-10-14 ENCOUNTER — Ambulatory Visit (HOSPITAL_BASED_OUTPATIENT_CLINIC_OR_DEPARTMENT_OTHER): Payer: Medicare Other | Admitting: Oncology

## 2013-10-14 ENCOUNTER — Telehealth: Payer: Self-pay | Admitting: Oncology

## 2013-10-14 ENCOUNTER — Encounter: Payer: Self-pay | Admitting: Oncology

## 2013-10-14 VITALS — BP 135/71 | HR 78 | Temp 97.6°F | Resp 18 | Ht 59.0 in | Wt 168.2 lb

## 2013-10-14 DIAGNOSIS — C50311 Malignant neoplasm of lower-inner quadrant of right female breast: Secondary | ICD-10-CM

## 2013-10-14 DIAGNOSIS — F3289 Other specified depressive episodes: Secondary | ICD-10-CM

## 2013-10-14 DIAGNOSIS — C50319 Malignant neoplasm of lower-inner quadrant of unspecified female breast: Secondary | ICD-10-CM

## 2013-10-14 DIAGNOSIS — Z170 Estrogen receptor positive status [ER+]: Secondary | ICD-10-CM

## 2013-10-14 DIAGNOSIS — F329 Major depressive disorder, single episode, unspecified: Secondary | ICD-10-CM

## 2013-10-14 NOTE — Progress Notes (Signed)
OFFICE PROGRESS NOTE  CC  Willodean Rosenthal, MD [address] [place] [address] Dr. Rolm Bookbinder Dr. Eppie Gibson  DIAGNOSIS: 80 year old female who self palpated a mass in her right breast. 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  STAGE:  Cancer of lower-inner quadrant of female breast  Primary site: Breast (Right)  Staging method: AJCC 7th Edition  Clinical: Stage IIA (T2, N0, cM0)  Summary: Stage IIA (T2, N0, cM0)  PRIOR THERAPY:  #1Recently on self breast examination patient found a mass in the right breast. Because of this she underwent a mammogram ultrasound and eventually a biopsy. The biopsy showed a grade 1 invasive ductal carcinoma with DCIS. The tumor was ER +100% PR +100% HER-2/neu negative with a proliferation marker Ki-67 low at 15%. Patient went on to then have MRI of the breasts performed that showed a 4.1 cm area of enhancement. No other abnormalities were seen  2 patient was interested in breast conservation and she was begun on antiestrogen therapy neoadjuvant daily with Femara2.5 mg daily in January 2014 through October 2014.  #3 status post lumpectomy with the final pathology revealing FINAL DIAGNOSIS Diagnosis 1. Breast, lumpectomy, Right - INVASIVE GRADE I DUCTAL CARCINOMA, SPANNING 2.2 CM IN GREATEST DIMENSION. - ASSOCIATED LOW GRADE DUCTAL CARCINOMA IN SITU - ANTERIOR MARGIN IS FOCALLY POSITIVE. - INVASIVE DUCTAL CARCINOMA IS LESS THAN 0.1 CM FROM MEDIAL MARGIN ON ORIGINAL LUMPECTOMY SPECIMEN (PLEASE SEE SPECIMEN # 2). - OTHER MARGINS ARE NEGATIVE. - SEE ONCOLOGY TEMPLATE. 2. Breast, excision, Right additional medial margin - BENIGN BREAST PARENCHYMA. - NO TUMOR SEEN.  #4 begin adjuvant antiestrogen therapy with letrozole 2.5 mg daily for a total of 5 years curative intent.  CURRENT THERAPY:Femara 2.5 mg daily INTERVAL HISTORY: Wendy Roth 80 y.o.  female returns for followup visit today. Overall she's doing well without any significant complaints. She had her lumpectomy performed. Results are as above. I have reviewed them with her. Patient unfortunately is having a lot of social issues including her husband being sick. She is very worried about that. Patient also has had some depression. She is on antidepressants now. She denies any fevers chills night sweats headaches shortness of breath chest pains palpitations. She feels that she is tolerating the letrozole well without any significant problems.she is having some sexual difficulties we discussed this at great length today.  She otherwise denies any nausea vomiting no aches or pains. Remainder of the 10 point review of systems is negative  MEDICAL HISTORY: Past Medical History  Diagnosis Date  . Breast cancer   . Hypertension   . Arthritis   . Back pain   . Fatigue   . Anxiety   . Depression   . Hypothyroidism   . Diabetes mellitus without complication     diet controlled.   Marland Kitchen History of kidney stones     ALLERGIES:  has No Known Allergies.  MEDICATIONS:  Current Outpatient Prescriptions  Medication Sig Dispense Refill  . amLODipine (NORVASC) 5 MG tablet Take 5 mg by mouth daily.      Marland Kitchen aspirin 81 MG tablet Take 81 mg by mouth daily.      Marland Kitchen buPROPion (WELLBUTRIN) 100 MG tablet Take 100 mg by mouth 2 (two) times daily.      Marland Kitchen CALCIUM-MAGNESIUM-VITAMIN D PO Take 1 tablet by mouth 2 (two) times daily.      . clonazePAM (KLONOPIN) 0.5 MG tablet Take  0.5 mg by mouth at bedtime as needed for anxiety.      . Cyanocobalamin (B-12 PO) Take 1 tablet by mouth daily.      . DULoxetine (CYMBALTA) 60 MG capsule Take 60 mg by mouth 2 (two) times daily.      Marland Kitchen ezetimibe-simvastatin (VYTORIN) 10-20 MG per tablet Take 1 tablet by mouth at bedtime.      . famotidine (PEPCID) 20 MG tablet Take 20 mg by mouth at bedtime.       Marland Kitchen letrozole (FEMARA) 2.5 MG tablet Take 1 tablet (2.5 mg total) by  mouth daily.  90 tablet  12  . levothyroxine (SYNTHROID, LEVOTHROID) 25 MCG tablet Take 25 mcg by mouth daily.      Marland Kitchen lisinopril (PRINIVIL,ZESTRIL) 20 MG tablet Take 20 mg by mouth daily.      . metoprolol succinate (TOPROL-XL) 50 MG 24 hr tablet Take 50 mg by mouth daily. Take with or immediately following a meal.      . Prenatal Vit-Fe Fumarate-FA (PRENATAL MULTIVITAMIN) TABS tablet Take 1 tablet by mouth daily at 12 noon.      Marland Kitchen rOPINIRole (REQUIP) 0.25 MG tablet Take 0.5 mg by mouth at bedtime.       No current facility-administered medications for this visit.    SURGICAL HISTORY:  Past Surgical History  Procedure Laterality Date  . Tonsillectomy    . Abdominal hysterectomy    . Meniscus repair      left  . Angioplasty      x2  . Carpal tunnel release      bilateral  . Parathyroidectomy    . Breast lumpectomy      left x2  . Coronary angioplasty  2005    stent  . Eye surgery Bilateral     cataracts  . Breast lumpectomy with needle localization Right 07/05/2013    Procedure: RIGHT BREAST WIRE GUIDED LUMPECTOMY ;  Surgeon: Rolm Bookbinder, MD;  Location: [facility];  Service: General;  Laterality: Right;  . Re-excision of breast cancer,superior margins Right 08/01/2013    Procedure: RE-EXCISION OF BREAST CANCER MARGIN;  Surgeon: Rolm Bookbinder, MD;  Location: [facility];  Service: General;  Laterality: Right;    REVIEW OF SYSTEMS:  Pertinent items are noted in HPI.   HEALTH MAINTENANCE:  PHYSICAL EXAMINATION: Blood pressure 135/71, pulse 78, temperature 97.6 F (36.4 C), temperature source Oral, resp. rate 18, height _0  (1.499 m), weight 168 lb 3.2 oz (76.295 kg). Body mass index is 33.95 kg/(m^2). ECOG PERFORMANCE STATUS: 0 - Asymptomatic   General appearance: alert, cooperative and appears stated age Resp: clear to auscultation bilaterally Back: symmetric, no curvature. ROM normal. No CVA tenderness. Cardio: regular rate and rhythm GI: soft, non-tender; bowel sounds  normal; no masses,  no organomegaly Extremities: extremities normal, atraumatic, no cyanosis or edema Neurologic: Grossly normal   LABORATORY DATA: Lab Results  Component Value Date   WBC 7.4 07/28/2013   HGB 14.1 07/28/2013   HCT 41.6 07/28/2013   MCV 90.4 07/28/2013   PLT 279 07/28/2013      Chemistry      Component Value Date/Time   NA 139 07/28/2013 1541   NA 143 02/21/2013 0905   K 3.7 07/28/2013 1541   K 4.0 02/21/2013 0905   CL 102 07/28/2013 1541   CL 105 02/21/2013 0905   CO2 27 07/28/2013 1541   CO2 27 02/21/2013 0905   BUN 18 07/28/2013 1541   BUN 20.3 02/21/2013 0905  CREATININE 1.11* 07/28/2013 1541   CREATININE 1.0 02/21/2013 0905      Component Value Date/Time   CALCIUM 9.3 07/28/2013 1541   CALCIUM 9.0 02/21/2013 0905   ALKPHOS 54 07/28/2013 1541   ALKPHOS 37* 02/21/2013 0905   AST 19 07/28/2013 1541   AST 16 02/21/2013 0905   ALT 17 07/28/2013 1541   ALT 19 02/21/2013 0905   BILITOT 0.3 07/28/2013 1541   BILITOT 0.65 02/21/2013 0905       RADIOGRAPHIC STUDIES:  No results found.  ASSESSMENT: 80 year old female with  #1 stage IIa invasive ductal carcinoma of the right breast diagnosed December 2013.s/p neoadjuvant treatment with letrozole 2.5 mg daily x 6 months  #2 MRIs of the breast will be performed in June for evaluation of reduction in size of tumor with the letrozole.  #3 patient is now status post right breast lumpectomy performed 07/28/2013. Overall she tolerated the procedure well. Her case was discussed at the multidisciplinary breast conference. Patient did not get radiation therapy.  #4 patient continues on letrozole 2.5 mg daily. She is tolerating it well.   PLAN:  #1 continue letrozole 2.5 mg daily.  #2 patient will be seen by in 6 months time for followup.  All questions were answered. The patient knows to call the clinic with any problems, questions or concerns. We can certainly see the patient much sooner if necessary.  I  spent 20 minutes counseling the patient face to face. The total time spent in the appointment was 30 minutes.    Marcy Panning, MD Medical/Oncology [facility] [phone] (beeper) [phone] (Office)

## 2013-10-14 NOTE — Telephone Encounter (Signed)
, °

## 2014-01-28 ENCOUNTER — Encounter (HOSPITAL_BASED_OUTPATIENT_CLINIC_OR_DEPARTMENT_OTHER): Payer: Self-pay | Admitting: Emergency Medicine

## 2014-01-28 ENCOUNTER — Emergency Department (HOSPITAL_BASED_OUTPATIENT_CLINIC_OR_DEPARTMENT_OTHER): Payer: Medicare Other

## 2014-01-28 ENCOUNTER — Emergency Department (HOSPITAL_BASED_OUTPATIENT_CLINIC_OR_DEPARTMENT_OTHER)
Admission: EM | Admit: 2014-01-28 | Discharge: 2014-01-28 | Disposition: A | Discharge status: Home / Self Care | Payer: Medicare Other | Attending: Emergency Medicine | Admitting: Emergency Medicine

## 2014-01-28 DIAGNOSIS — E119 Type 2 diabetes mellitus without complications: Secondary | ICD-10-CM | POA: Insufficient documentation

## 2014-01-28 DIAGNOSIS — Z7982 Long term (current) use of aspirin: Secondary | ICD-10-CM | POA: Insufficient documentation

## 2014-01-28 DIAGNOSIS — IMO0001 Reserved for inherently not codable concepts without codable children: Secondary | ICD-10-CM | POA: Insufficient documentation

## 2014-01-28 DIAGNOSIS — J40 Bronchitis, not specified as acute or chronic: Secondary | ICD-10-CM

## 2014-01-28 DIAGNOSIS — J209 Acute bronchitis, unspecified: Secondary | ICD-10-CM | POA: Insufficient documentation

## 2014-01-28 DIAGNOSIS — F329 Major depressive disorder, single episode, unspecified: Secondary | ICD-10-CM | POA: Insufficient documentation

## 2014-01-28 DIAGNOSIS — I10 Essential (primary) hypertension: Secondary | ICD-10-CM | POA: Insufficient documentation

## 2014-01-28 DIAGNOSIS — F3289 Other specified depressive episodes: Secondary | ICD-10-CM | POA: Insufficient documentation

## 2014-01-28 DIAGNOSIS — E039 Hypothyroidism, unspecified: Secondary | ICD-10-CM | POA: Insufficient documentation

## 2014-01-28 DIAGNOSIS — M129 Arthropathy, unspecified: Secondary | ICD-10-CM | POA: Insufficient documentation

## 2014-01-28 DIAGNOSIS — Z79899 Other long term (current) drug therapy: Secondary | ICD-10-CM | POA: Insufficient documentation

## 2014-01-28 DIAGNOSIS — Z87442 Personal history of urinary calculi: Secondary | ICD-10-CM | POA: Insufficient documentation

## 2014-01-28 DIAGNOSIS — F411 Generalized anxiety disorder: Secondary | ICD-10-CM | POA: Insufficient documentation

## 2014-01-28 DIAGNOSIS — Z853 Personal history of malignant neoplasm of breast: Secondary | ICD-10-CM | POA: Insufficient documentation

## 2014-01-28 LAB — COMPREHENSIVE METABOLIC PANEL
ALT: 20 U/L (ref 0–35)
AST: 17 U/L (ref 0–37)
Albumin: 3.8 g/dL (ref 3.5–5.2)
Alkaline Phosphatase: 52 U/L (ref 39–117)
BILIRUBIN TOTAL: 0.7 mg/dL (ref 0.3–1.2)
BUN: 18 mg/dL (ref 6–23)
CALCIUM: 9.4 mg/dL (ref 8.4–10.5)
CHLORIDE: 100 meq/L (ref 96–112)
CO2: 26 meq/L (ref 19–32)
CREATININE: 1 mg/dL (ref 0.50–1.10)
GFR calc Af Amer: 60 mL/min — ABNORMAL LOW (ref >90)
GFR, EST NON AFRICAN AMERICAN: 52 mL/min — AB (ref >90)
Glucose, Bld: 145 mg/dL — ABNORMAL HIGH (ref 70–99)
Potassium: 4.2 mEq/L (ref 3.7–5.3)
Sodium: 139 mEq/L (ref 137–147)
Total Protein: 6.4 g/dL (ref 6.0–8.3)

## 2014-01-28 LAB — CBC WITH DIFFERENTIAL/PLATELET
BASOS ABS: 0.1 10*3/uL (ref 0.0–0.1)
BASOS PCT: 1 % (ref 0–1)
EOS PCT: 5 % (ref 0–5)
Eosinophils Absolute: 0.4 10*3/uL (ref 0.0–0.7)
HCT: 42.2 % (ref 36.0–46.0)
HEMOGLOBIN: 14.4 g/dL (ref 12.0–15.0)
Lymphocytes Relative: 25 % (ref 12–46)
Lymphs Abs: 2.2 10*3/uL (ref 0.7–4.0)
MCH: 31.4 pg (ref 26.0–34.0)
MCHC: 34.1 g/dL (ref 30.0–36.0)
MCV: 92.1 fL (ref 78.0–100.0)
Monocytes Absolute: 0.7 10*3/uL (ref 0.1–1.0)
Monocytes Relative: 8 % (ref 3–12)
Neutro Abs: 5.5 10*3/uL (ref 1.7–7.7)
Neutrophils Relative %: 62 % (ref 43–77)
Platelets: 297 10*3/uL (ref 150–400)
RBC: 4.58 MIL/uL (ref 3.87–5.11)
RDW: 13 % (ref 11.5–15.5)
WBC: 8.9 10*3/uL (ref 4.0–10.5)

## 2014-01-28 MED ORDER — ALBUTEROL SULFATE HFA 108 (90 BASE) MCG/ACT IN AERS: 1.0000 | INHALATION_SPRAY | Freq: Four times a day (QID) | RESPIRATORY_TRACT | Status: DC | PRN

## 2014-01-28 MED ORDER — LEVOFLOXACIN 500 MG PO TABS: 500.0000 mg | ORAL_TABLET | Freq: Every day | ORAL | Status: DC

## 2014-01-28 MED ORDER — PREDNISONE 20 MG PO TABS: 60.0000 mg | ORAL_TABLET | Freq: Every day | ORAL | Status: DC

## 2014-01-28 MED ORDER — ALBUTEROL SULFATE (2.5 MG/3ML) 0.083% IN NEBU
2.5000 mg | INHALATION_SOLUTION | Freq: Once | RESPIRATORY_TRACT | Status: AC
  Administered 2014-01-28: 2.5 mg via RESPIRATORY_TRACT
  Filled 2014-01-28: qty 3

## 2014-01-28 NOTE — ED Provider Notes (Signed)
Medical screening examination/treatment/procedure(s) were conducted as a shared visit with non-physician practitioner(s) and myself.  I personally evaluated the patient during the encounter.   EKG Interpretation None      Pt with cough, subjective fever.  Well appearing, lungs clear.  Will d/c with symptomatic treatment, possible abx given pt's age.  Malvin Johns, MD 01/28/14 [phone]

## 2014-01-28 NOTE — ED Notes (Signed)
Is also c/o bilateral posterior thigh pain.

## 2014-01-28 NOTE — Discharge Instructions (Signed)

## 2014-01-28 NOTE — ED Notes (Signed)
Reports onset of cough three weeks ago.  Cough is productive, unsure of fever.  Tx with an antibiotic, cortisone with some improvement in symptoms.  Recent travel to [place] and [place] (last week).  Reoccurrence of symptoms last night.  OTC cough suppressants not working.

## 2014-01-28 NOTE — ED Provider Notes (Signed)
CSN: [id number]     Arrival date & time 01/28/14  1405 History   First MD Initiated Contact with Patient 01/28/14 1419     Chief Complaint  Patient presents with  . Cough     (Consider location/radiation/quality/duration/timing/severity/associated sxs/prior Treatment) Patient is a 80 y.o. female presenting with cough. The history is provided by the patient. No language interpreter was used.  Cough Cough characteristics:  Dry and paroxysmal Severity:  Moderate Associated symptoms: no chest pain, no fever, no myalgias and no shortness of breath   Associated symptoms comment:  She reports recent URI illness that was treated with an antibiotic prescribed by her doctor that improved her symptoms of cough and congestion. She finished that prescription about one week ago and started having an increased cough yesterday. No fever, chest pain or vomiting. She states that the cough is persistent and is interrupting her sleep.    Past Medical History  Diagnosis Date  . Breast cancer   . Hypertension   . Arthritis   . Back pain   . Fatigue   . Anxiety   . Depression   . Hypothyroidism   . Diabetes mellitus without complication     diet controlled.   Marland Kitchen History of kidney stones    Past Surgical History  Procedure Laterality Date  . Tonsillectomy    . Abdominal hysterectomy    . Meniscus repair      left  . Angioplasty      x2  . Carpal tunnel release      bilateral  . Parathyroidectomy    . Breast lumpectomy      left x2  . Coronary angioplasty  2005    stent  . Eye surgery Bilateral     cataracts  . Breast lumpectomy with needle localization Right 07/05/2013    Procedure: RIGHT BREAST WIRE GUIDED LUMPECTOMY ;  Surgeon: Rolm Bookbinder, MD;  Location: [facility];  Service: General;  Laterality: Right;  . Re-excision of breast cancer,superior margins Right 08/01/2013    Procedure: RE-EXCISION OF BREAST CANCER MARGIN;  Surgeon: Rolm Bookbinder, MD;  Location: [facility];  Service:  General;  Laterality: Right;   Family History  Problem Relation Age of Onset  . Breast cancer Maternal Aunt 28    ? bilateral  . Cancer Mother 48    lung cancer   . Cancer Father 45    possible breast cancer  . Cancer Sister     lung cancer ? primary; paternal half sister  . Cancer Maternal Grandmother 6    prostate cancer   History  Substance Use Topics  . Smoking status: Never Smoker   . Smokeless tobacco: Never Used  . Alcohol Use: Yes     Comment: socially   OB History   Grav Para Term Preterm Abortions TAB SAB Ect Mult Living                 Review of Systems  Constitutional: Negative for fever.  HENT: Negative for congestion.   Respiratory: Positive for cough. Negative for shortness of breath.   Cardiovascular: Negative for chest pain.  Gastrointestinal: Negative for vomiting.  Musculoskeletal: Negative for myalgias.      Allergies  Review of patient's allergies indicates no known allergies.  Home Medications   Prior to Admission medications   Medication Sig Start Date End Date Taking? Authorizing Provider  amLODipine (NORVASC) 5 MG tablet Take 5 mg by mouth daily.    Historical Provider, MD  aspirin 81  MG tablet Take 81 mg by mouth daily.    Historical Provider, MD  buPROPion (WELLBUTRIN) 100 MG tablet Take 100 mg by mouth 2 (two) times daily.    Historical Provider, MD  CALCIUM-MAGNESIUM-VITAMIN D PO Take 1 tablet by mouth 2 (two) times daily.    Historical Provider, MD  clonazePAM (KLONOPIN) 0.5 MG tablet Take 0.5 mg by mouth at bedtime as needed for anxiety.    Historical Provider, MD  Cyanocobalamin (B-12 PO) Take 1 tablet by mouth daily.    Historical Provider, MD  DULoxetine (CYMBALTA) 60 MG capsule Take 60 mg by mouth 2 (two) times daily.    Historical Provider, MD  ezetimibe-simvastatin (VYTORIN) 10-20 MG per tablet Take 1 tablet by mouth at bedtime.    Historical Provider, MD  famotidine (PEPCID) 20 MG tablet Take 20 mg by mouth at bedtime.      Historical Provider, MD  letrozole (FEMARA) 2.5 MG tablet Take 1 tablet (2.5 mg total) by mouth daily. 10/20/12   Deatra Robinson, MD  levothyroxine (SYNTHROID, LEVOTHROID) 25 MCG tablet Take 25 mcg by mouth daily.    Historical Provider, MD  lisinopril (PRINIVIL,ZESTRIL) 20 MG tablet Take 20 mg by mouth daily.    Historical Provider, MD  metoprolol succinate (TOPROL-XL) 50 MG 24 hr tablet Take 50 mg by mouth daily. Take with or immediately following a meal.    Historical Provider, MD  Prenatal Vit-Fe Fumarate-FA (PRENATAL MULTIVITAMIN) TABS tablet Take 1 tablet by mouth daily at 12 noon.    Historical Provider, MD  rOPINIRole (REQUIP) 0.25 MG tablet Take 0.5 mg by mouth at bedtime.    Historical Provider, MD   BP 160/109  Pulse 84  Temp(Src) 97.6 F (36.4 C) (Oral)  Resp 18  Ht 5' (1.524 m)  Wt 163 lb (73.936 kg)  BMI 31.83 kg/m2  SpO2 94% Physical Exam  Constitutional: She is oriented to person, place, and time. She appears well-developed and well-nourished.  HENT:  Head: Normocephalic.  Eyes: Conjunctivae are normal.  Neck: Normal range of motion. Neck supple.  Cardiovascular: Normal rate and regular rhythm.   No murmur heard. Pulmonary/Chest: Effort normal and breath sounds normal. She has no wheezes. She has no rales.  Abdominal: Soft. Bowel sounds are normal. There is no tenderness. There is no rebound and no guarding.  Musculoskeletal: Normal range of motion. She exhibits no edema.  Neurological: She is alert and oriented to person, place, and time.  Skin: Skin is warm and dry. No rash noted.  Psychiatric: She has a normal mood and affect.    ED Course  Procedures (including critical care time) Labs Review Labs Reviewed  COMPREHENSIVE METABOLIC PANEL - Abnormal; Notable for the following:    Glucose, Bld 145 (*)    GFR calc non Af Amer 52 (*)    GFR calc Af Amer 60 (*)    All other components within normal limits  CBC WITH DIFFERENTIAL    Imaging Review No results  found.   EKG Interpretation None      MDM   Final diagnoses:  None    1. Bronchitis  The patient reports having a negative chest x-ray this week ordered by her doctor. No fever, no hypoxia, therefore CXR was not repeated today. Patient seen by Dr. Tamera Punt. Will prescribe Levaquin and encourage follow up with her doctor this week for rehcekc.     Dewaine Oats, PA-C 01/28/14 1714

## 2014-02-24 ENCOUNTER — Encounter (INDEPENDENT_AMBULATORY_CARE_PROVIDER_SITE_OTHER): Payer: Self-pay | Admitting: General Surgery

## 2014-02-24 ENCOUNTER — Ambulatory Visit (INDEPENDENT_AMBULATORY_CARE_PROVIDER_SITE_OTHER): Payer: Medicare Other | Admitting: General Surgery

## 2014-02-24 VITALS — BP 132/70 | HR 75 | Temp 97.5°F | Ht 59.0 in | Wt 163.0 lb

## 2014-02-24 DIAGNOSIS — C50319 Malignant neoplasm of lower-inner quadrant of unspecified female breast: Secondary | ICD-10-CM

## 2014-02-24 NOTE — Addendum Note (Signed)
Addended by: Illene Regulus on: 02/24/2014 02:01 PM   Modules accepted: Orders

## 2014-02-24 NOTE — Patient Instructions (Signed)

## 2014-02-24 NOTE — Progress Notes (Signed)
Subjective:     Patient ID: Wendy Roth, female   DOB: [birth year], 80 y.o.   MRN: [id number]  HPI  57 yof who underwent primary endocrine therapy for a right breast cancer. She had a good result by MRI. She then underwent a lumpectomy that showed a 2.2 cm invasive ductal carcinoma. She was then re-excised. She has been treated since with letrozole which she still is tolerating well.  She has no complaints referable to either breast.  Her husband has had some health issues recently and she has been working through that.   Review of Systems     Objective:   Physical Exam  Vitals reviewed. Constitutional: She appears well-developed and well-nourished.  Neck: Neck supple.  Pulmonary/Chest: Right breast exhibits no inverted nipple, no mass, no nipple discharge, no skin change and no tenderness. Left breast exhibits no inverted nipple, no mass, no nipple discharge, no skin change and no tenderness.    Lymphadenopathy:    She has no cervical adenopathy.    She has no axillary adenopathy.       Right: No supraclavicular adenopathy present.       Left: No supraclavicular adenopathy present.       Assessment:     Stage II right breast cancer     Plan:     She has no clinical evidence of recurrence. Her exam is normal. She is going to continue to do her own monthly self exams. I will plan on seeing her back in one year for another exam. I will send her to get her mammogram now as I think she is overdue.

## 2014-03-09 ENCOUNTER — Ambulatory Visit
Admission: RE | Admit: 2014-03-09 | Discharge: 2014-03-09 | Disposition: A | Discharge status: Home / Self Care | Payer: Medicare Other | Source: Ambulatory Visit | Attending: General Surgery | Admitting: General Surgery

## 2014-03-09 DIAGNOSIS — C50319 Malignant neoplasm of lower-inner quadrant of unspecified female breast: Secondary | ICD-10-CM

## 2014-03-31 ENCOUNTER — Other Ambulatory Visit: Payer: Self-pay | Admitting: Oncology

## 2014-03-31 DIAGNOSIS — C50919 Malignant neoplasm of unspecified site of unspecified female breast: Secondary | ICD-10-CM

## 2014-04-12 ENCOUNTER — Telehealth: Payer: Self-pay | Admitting: Hematology and Oncology

## 2014-04-12 NOTE — Telephone Encounter (Signed)
, °

## 2014-04-18 ENCOUNTER — Other Ambulatory Visit: Payer: Medicare Other

## 2014-04-18 ENCOUNTER — Ambulatory Visit: Payer: Medicare Other | Admitting: Oncology

## 2014-05-12 IMAGING — MG MM DIGITAL SCREENING BILAT
8 series · 8 of 24 positions shown · non-contrast
Comparison: Previous exams

CLINICAL DATA: Screening.

DIGITAL SCREENING MAMMOGRAM WITH CAD
DIGITAL BREAST TOMOSYNTHESIS
Digital breast tomosynthesis images are acquired in two
projections.  These images are reviewed in combination with the
digital mammogram, confirming the findings below.

[L CC]
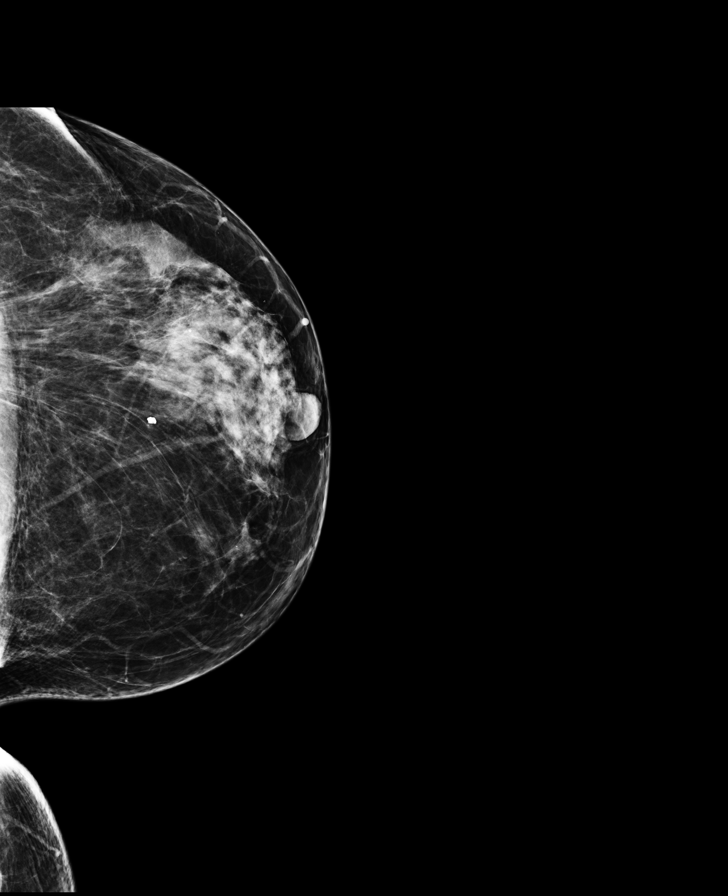

[L MLO]
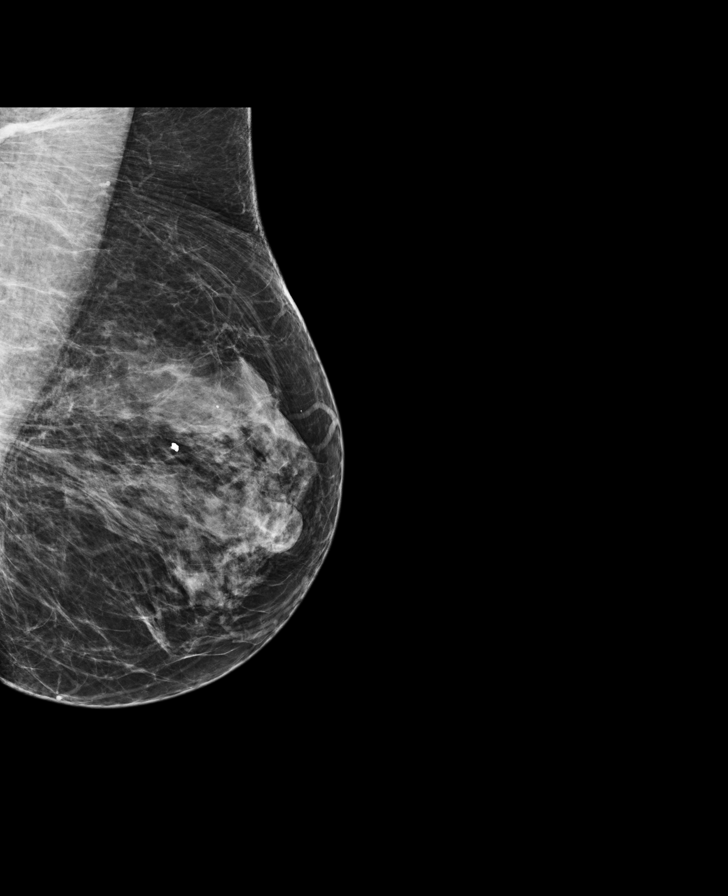

[R CC]
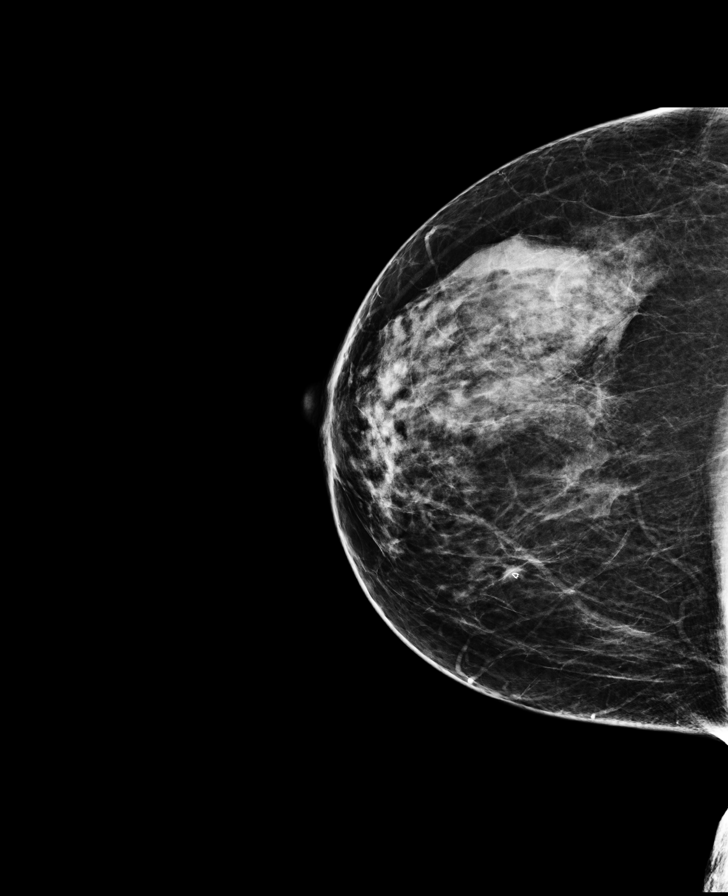

[R MLO]
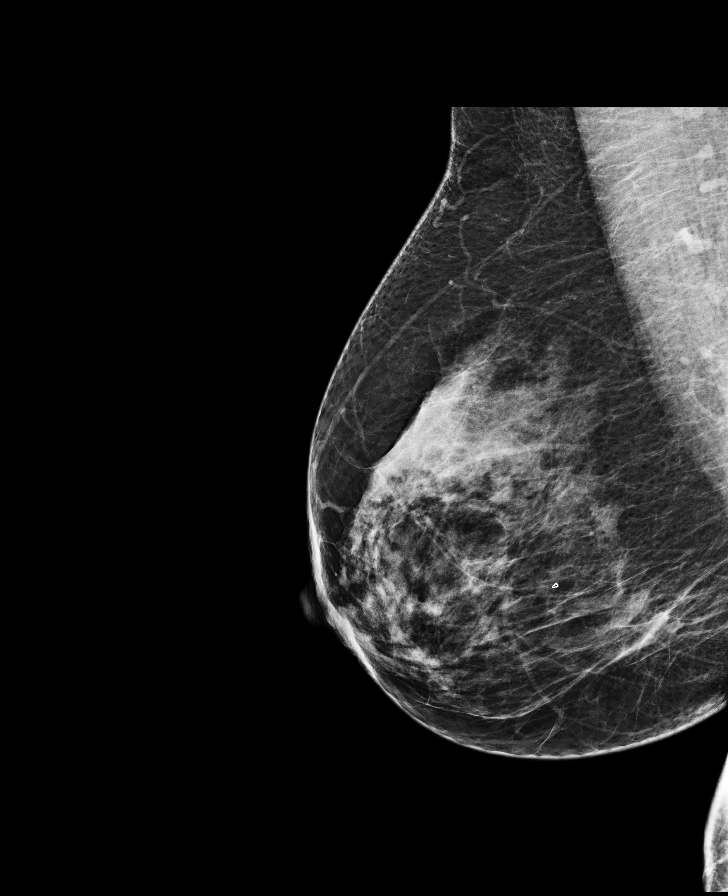

[RMLO COMBO BREAST TOMOSYNTHESIS IMAGE tomo · tomo slice 35/68.0]
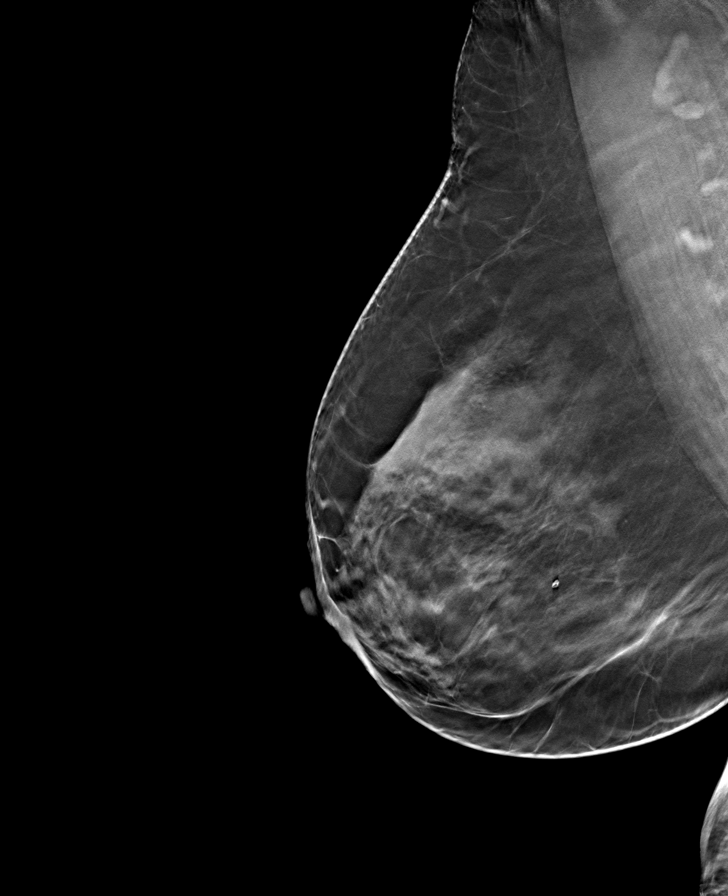

[RCC COMBO BREAST TOMOSYNTHESIS IMAGE tomo · tomo slice 32/63.0]
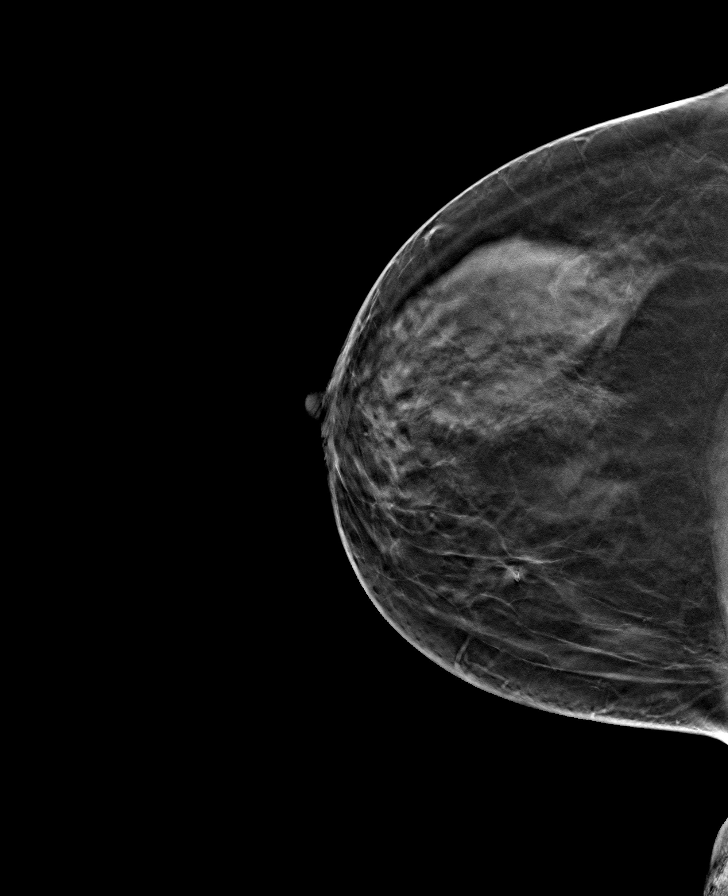

[LMLO COMBO BREAST TOMOSYNTHESIS IMAGE tomo · tomo slice 32/63.0]
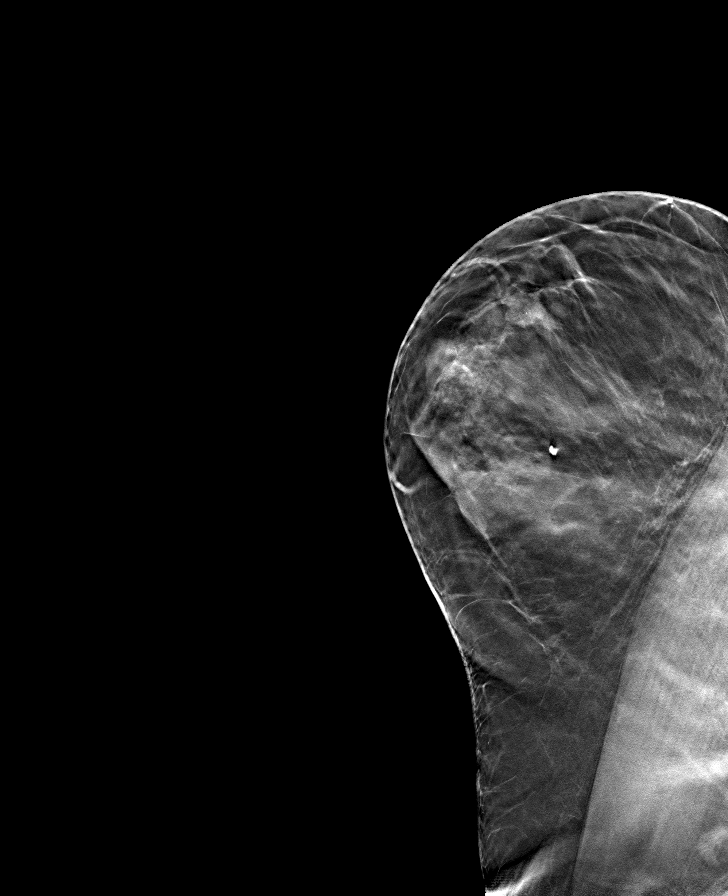

[LCC COMBO BREAST TOMOSYNTHESIS IMAGE tomo · tomo slice 31/60.0]
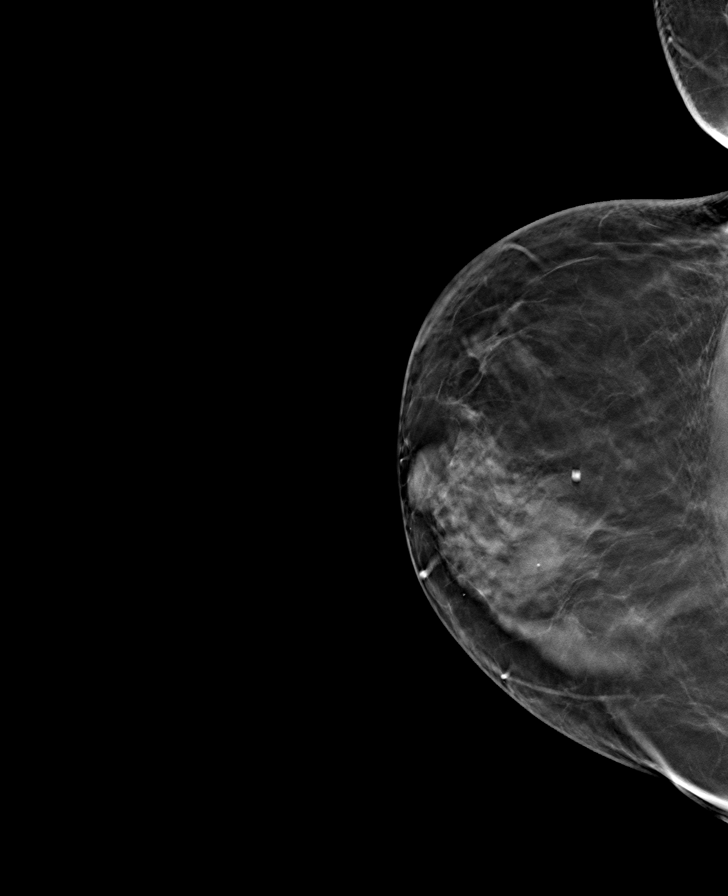

[8 of 24 positions shown; findings below may reference images not displayed]

FINDINGS: There are scattered fibroglandular densities. No
suspicious masses, architectural distortion, or calcifications are
present.

Images were processed with CAD.
IMPRESSION: No specific mammographic evidence of malignancy.

A result letter of this screening mammogram will be mailed directly
to the patient.

RECOMMENDATION:
Screening mammogram in one year. (Code:GL-O-GXP)

BI-RADS CATEGORY 1:  Negative

## 2014-07-12 ENCOUNTER — Ambulatory Visit: Payer: Medicare Other | Admitting: Hematology and Oncology

## 2014-07-12 ENCOUNTER — Other Ambulatory Visit: Payer: Medicare Other

## 2014-07-14 ENCOUNTER — Other Ambulatory Visit: Payer: Self-pay

## 2014-07-14 DIAGNOSIS — C50319 Malignant neoplasm of lower-inner quadrant of unspecified female breast: Secondary | ICD-10-CM

## 2014-07-17 ENCOUNTER — Other Ambulatory Visit (HOSPITAL_BASED_OUTPATIENT_CLINIC_OR_DEPARTMENT_OTHER): Payer: Medicare Other

## 2014-07-17 ENCOUNTER — Ambulatory Visit (HOSPITAL_BASED_OUTPATIENT_CLINIC_OR_DEPARTMENT_OTHER): Payer: Medicare Other | Admitting: Hematology and Oncology

## 2014-07-17 VITALS — BP 117/72 | HR 74 | Temp 98.4°F | Resp 18 | Ht 59.0 in | Wt 163.8 lb

## 2014-07-17 DIAGNOSIS — Z853 Personal history of malignant neoplasm of breast: Secondary | ICD-10-CM

## 2014-07-17 DIAGNOSIS — R6882 Decreased libido: Secondary | ICD-10-CM

## 2014-07-17 DIAGNOSIS — C50319 Malignant neoplasm of lower-inner quadrant of unspecified female breast: Secondary | ICD-10-CM

## 2014-07-17 DIAGNOSIS — Z79811 Long term (current) use of aromatase inhibitors: Secondary | ICD-10-CM

## 2014-07-17 LAB — CBC WITH DIFFERENTIAL/PLATELET
BASO%: 0.4 % (ref 0.0–2.0)
BASOS ABS: 0 10*3/uL (ref 0.0–0.1)
EOS ABS: 0.2 10*3/uL (ref 0.0–0.5)
EOS%: 3.2 % (ref 0.0–7.0)
HCT: 42 % (ref 34.8–46.6)
HGB: 14.1 g/dL (ref 11.6–15.9)
LYMPH#: 2.4 10*3/uL (ref 0.9–3.3)
LYMPH%: 43.8 % (ref 14.0–49.7)
MCH: 30.3 pg (ref 25.1–34.0)
MCHC: 33.6 g/dL (ref 31.5–36.0)
MCV: 90.3 fL (ref 79.5–101.0)
MONO#: 0.5 10*3/uL (ref 0.1–0.9)
MONO%: 9.3 % (ref 0.0–14.0)
NEUT#: 2.3 10*3/uL (ref 1.5–6.5)
NEUT%: 43.3 % (ref 38.4–76.8)
NRBC: 0 % (ref 0–0)
Platelets: 264 10*3/uL (ref 145–400)
RBC: 4.65 10*6/uL (ref 3.70–5.45)
RDW: 13 % (ref 11.2–14.5)
WBC: 5.4 10*3/uL (ref 3.9–10.3)

## 2014-07-17 LAB — COMPREHENSIVE METABOLIC PANEL (CC13)
ALBUMIN: 3.5 g/dL (ref 3.5–5.0)
ALT: 16 U/L (ref 0–55)
ANION GAP: 9 meq/L (ref 3–11)
AST: 15 U/L (ref 5–34)
Alkaline Phosphatase: 51 U/L (ref 40–150)
BUN: 21.3 mg/dL (ref 7.0–26.0)
CALCIUM: 9.5 mg/dL (ref 8.4–10.4)
CHLORIDE: 105 meq/L (ref 98–109)
CO2: 27 meq/L (ref 22–29)
Creatinine: 1.3 mg/dL — ABNORMAL HIGH (ref 0.6–1.1)
GLUCOSE: 108 mg/dL (ref 70–140)
POTASSIUM: 4.6 meq/L (ref 3.5–5.1)
SODIUM: 142 meq/L (ref 136–145)
TOTAL PROTEIN: 6.4 g/dL (ref 6.4–8.3)
Total Bilirubin: 0.47 mg/dL (ref 0.20–1.20)

## 2014-07-17 MED ORDER — ESTRADIOL 10 MCG VA TABS: 10.0000 ug | ORAL_TABLET | VAGINAL | Status: DC

## 2014-07-17 NOTE — Assessment & Plan Note (Signed)
Right breast invasive ductal carcinoma T2, N0, M0 stage II A. with DCIS ER was n.p.o. 100% HER-2 negative Ki-67 15% treated with neoadjuvant antiestrogen therapy followed by left lumpectomy followed by antiestrogen therapy. She is tolerating letrozole extremely well without any major problems or concerns.  Loss of libido: Patient reports that because of discomfort in the vaginal area she does not have any interest in sex. She will like to have something for that. She has used topical lubricants but they have not been very helpful. So we will prescribe her topical estrogen.  Return to clinic in 6 months for followup

## 2014-07-17 NOTE — Progress Notes (Signed)
Patient Care Team: Willodean Rosenthal, MD as PCP - General (Internal Medicine)  DIAGNOSIS: Cancer of lower-inner quadrant of left female breast   Primary site: Breast (Right)   Staging method: AJCC 7th Edition   Clinical: Stage IIA (T2, N0, cM0)   Summary: Stage IIA (T2, N0, cM0)   Clinical comments: Staged at breast conference 1.15.14   SUMMARY OF ONCOLOGIC HISTORY:   Cancer of lower-inner quadrant of left female breast   10/12/2012 Initial Diagnosis Right breast biopsy: Invasive ductal carcinoma with DCIS ER 100%, PR 100%, Ki-67 15%, HER-2 negative ratio 1.13   10/13/2012 - 07/18/2013 Anti-estrogen oral therapy Neoadjuvant Antiestrogen therapy with Femara 2.5 mg daily   07/05/2013 Surgery Right breast lumpectomy: Invasive ductal carcinoma grade 1-2.2 cm with low-grade DCIS, the excision margins negative   08/17/2013 -  Anti-estrogen oral therapy Letrozole 2.5 mg daily for 5 years    CHIEF COMPLIANT: Followup of breast cancer  INTERVAL HISTORY: Wendy Roth is a 80 year old Caucasian with above-mentioned history of right-sided breast cancer treated with neoadjuvant antiestrogen therapy followed by lumpectomy and has been on antiestrogen therapy with letrozole since November 2014. Overall she has been on antiestrogen therapy since January 2014. She is been tolerating letrozole extremely well except for loss of libido. She reports that she has some discomfort in the vaginal area and she struggled against had helped. She gets annual surveillance mammograms done in June of the year in June 2015 her mammograms were normal. She has an occasional discomfort in the breast but no palpable nodules.  REVIEW OF SYSTEMS:   Constitutional: Denies fevers, chills or abnormal weight loss Eyes: Denies blurriness of vision Ears, nose, mouth, throat, and face: Denies mucositis or sore throat Respiratory: Denies cough, dyspnea or wheezes Cardiovascular: Denies palpitation, chest discomfort or lower extremity  swelling Gastrointestinal:  Denies nausea, heartburn or change in bowel habits Skin: Denies abnormal skin rashes Lymphatics: Denies new lymphadenopathy or easy bruising Neurological:Denies numbness, tingling or new weaknesses Behavioral/Psych: Mood is stable, no new changes  Breast:  denies any pain or lumps or nodules in either breasts All other systems were reviewed with the patient and are negative.  I have reviewed the past medical history, past surgical history, social history and family history with the patient and they are unchanged from previous note.  ALLERGIES:  has No Known Allergies.  MEDICATIONS:  Current Outpatient Prescriptions  Medication Sig Dispense Refill  . clopidogrel (PLAVIX) 75 MG tablet Take by mouth.      . hydrocodone-acetaminophen (LORCET-HD) 5-500 MG per capsule Take by mouth.      . Magnesium Oxide 420 MG TABS Take by mouth.      Marland Kitchen albuterol (PROVENTIL HFA;VENTOLIN HFA) 108 (90 BASE) MCG/ACT inhaler Inhale 1-2 puffs into the lungs every 6 (six) hours as needed for wheezing or shortness of breath.  1 Inhaler  0  . amLODipine (NORVASC) 5 MG tablet Take 5 mg by mouth daily.      Marland Kitchen aspirin 81 MG tablet Take 81 mg by mouth daily.      Marland Kitchen buPROPion (WELLBUTRIN) 100 MG tablet Take 100 mg by mouth 2 (two) times daily.      Marland Kitchen CALCIUM-MAGNESIUM-VITAMIN D PO Take 1 tablet by mouth 2 (two) times daily.      . clonazePAM (KLONOPIN) 0.5 MG tablet Take 0.5 mg by mouth at bedtime as needed for anxiety.      . Cyanocobalamin (B-12 PO) Take 1 tablet by mouth daily.      Marland Kitchen  DULoxetine (CYMBALTA) 60 MG capsule Take 60 mg by mouth 2 (two) times daily.      Marland Kitchen ezetimibe-simvastatin (VYTORIN) 10-20 MG per tablet Take 1 tablet by mouth at bedtime.      . famotidine (PEPCID) 20 MG tablet Take 20 mg by mouth at bedtime.       Marland Kitchen FEMARA 2.5 MG tablet TAKE 1 TABLET DAILY  90 tablet  0  . levofloxacin (LEVAQUIN) 500 MG tablet Take 1 tablet (500 mg total) by mouth daily.  7 tablet  0  .  levothyroxine (SYNTHROID, LEVOTHROID) 25 MCG tablet Take 25 mcg by mouth daily.      Marland Kitchen lisinopril (PRINIVIL,ZESTRIL) 20 MG tablet Take 20 mg by mouth daily.      . metoprolol succinate (TOPROL-XL) 50 MG 24 hr tablet Take 50 mg by mouth daily. Take with or immediately following a meal.      . predniSONE (DELTASONE) 20 MG tablet Take 3 tablets (60 mg total) by mouth daily.  9 tablet  0  . Prenatal Vit-Fe Fumarate-FA (PRENATAL MULTIVITAMIN) TABS tablet Take 1 tablet by mouth daily at 12 noon.      Marland Kitchen rOPINIRole (REQUIP) 0.25 MG tablet Take 0.5 mg by mouth at bedtime.       No current facility-administered medications for this visit.    PHYSICAL EXAMINATION: ECOG PERFORMANCE STATUS: 1 - Symptomatic but completely ambulatory  Filed Vitals:   07/17/14 1152  BP: 117/72  Pulse: 74  Temp: 98.4 F (36.9 C)  Resp: 18   Filed Weights   07/17/14 1152  Weight: 163 lb 12.8 oz (74.299 kg)    GENERAL:alert, no distress and comfortable SKIN: skin color, texture, turgor are normal, no rashes or significant lesions EYES: normal, Conjunctiva are pink and non-injected, sclera clear OROPHARYNX:no exudate, no erythema and lips, buccal mucosa, and tongue normal  NECK: supple, thyroid normal size, non-tender, without nodularity LYMPH:  no palpable lymphadenopathy in the cervical, axillary or inguinal LUNGS: clear to auscultation and percussion with normal breathing effort HEART: regular rate & rhythm and no murmurs and no lower extremity edema ABDOMEN:abdomen soft, non-tender and normal bowel sounds Musculoskeletal:no cyanosis of digits and no clubbing  NEURO: alert & oriented x 3 with fluent speech, no focal motor/sensory deficits BREAST: No palpable masses or nodules in either right or left breasts. No palpable axillary supraclavicular or infraclavicular adenopathy no breast tenderness or nipple discharge.   LABORATORY DATA:  I have reviewed the data as listed   Chemistry      Component Value  Date/Time   NA 142 07/17/2014 1138   NA 139 01/28/2014 1455   K 4.6 07/17/2014 1138   K 4.2 01/28/2014 1455   CL 100 01/28/2014 1455   CL 105 02/21/2013 0905   CO2 27 07/17/2014 1138   CO2 26 01/28/2014 1455   BUN 21.3 07/17/2014 1138   BUN 18 01/28/2014 1455   CREATININE 1.3* 07/17/2014 1138   CREATININE 1.00 01/28/2014 1455      Component Value Date/Time   CALCIUM 9.5 07/17/2014 1138   CALCIUM 9.4 01/28/2014 1455   ALKPHOS 51 07/17/2014 1138   ALKPHOS 52 01/28/2014 1455   AST 15 07/17/2014 1138   AST 17 01/28/2014 1455   ALT 16 07/17/2014 1138   ALT 20 01/28/2014 1455   BILITOT 0.47 07/17/2014 1138   BILITOT 0.7 01/28/2014 1455       Lab Results  Component Value Date   WBC 5.4 07/17/2014   HGB 14.1 07/17/2014  HCT 42.0 07/17/2014   MCV 90.3 07/17/2014   PLT 264 07/17/2014   NEUTROABS 2.3 07/17/2014     RADIOGRAPHIC STUDIES: I have personally reviewed the radiology reports and agreed with their findings. Mammograms done in June 2015 were normal  ASSESSMENT & PLAN:  Cancer of lower-inner quadrant of left female breast Right breast invasive ductal carcinoma T2, N0, M0 stage II A. with DCIS ER was n.p.o. 100% HER-2 negative Ki-67 15% treated with neoadjuvant antiestrogen therapy followed by left lumpectomy followed by antiestrogen therapy. She is tolerating letrozole extremely well without any major problems or concerns.  Loss of libido: Patient reports that because of discomfort in the vaginal area she does not have any interest in sex. She will like to have something for that. She has used topical lubricants but they have not been very helpful. So we will prescribe her topical estrogen.  Return to clinic in 6 months for followup   No orders of the defined types were placed in this encounter.   The patient has a good understanding of the overall plan. she agrees with it. She will call with any problems that may develop before her next visit here.  I spent 20 minutes  counseling the patient face to face. The total time spent in the appointment was 25 minutes and more than 50% was on counseling and review of test results    Rulon Eisenmenger, MD 07/17/2014 12:39 PM

## 2014-07-20 ENCOUNTER — Telehealth: Payer: Self-pay | Admitting: Hematology and Oncology

## 2014-07-20 NOTE — Telephone Encounter (Signed)
, °

## 2014-07-21 ENCOUNTER — Other Ambulatory Visit: Payer: Self-pay

## 2014-07-26 ENCOUNTER — Other Ambulatory Visit: Payer: Self-pay | Admitting: Adult Health

## 2014-07-26 DIAGNOSIS — C50312 Malignant neoplasm of lower-inner quadrant of left female breast: Secondary | ICD-10-CM

## 2014-08-30 ENCOUNTER — Encounter (HOSPITAL_COMMUNITY): Payer: Self-pay | Admitting: Emergency Medicine

## 2014-08-30 ENCOUNTER — Emergency Department (HOSPITAL_COMMUNITY): Payer: Medicare Other

## 2014-08-30 ENCOUNTER — Emergency Department (HOSPITAL_COMMUNITY)
Admission: EM | Admit: 2014-08-30 | Discharge: 2014-08-30 | Disposition: A | Discharge status: Home / Self Care | Payer: Medicare Other | Attending: Emergency Medicine | Admitting: Emergency Medicine

## 2014-08-30 DIAGNOSIS — Z853 Personal history of malignant neoplasm of breast: Secondary | ICD-10-CM | POA: Insufficient documentation

## 2014-08-30 DIAGNOSIS — F419 Anxiety disorder, unspecified: Secondary | ICD-10-CM | POA: Diagnosis not present

## 2014-08-30 DIAGNOSIS — E039 Hypothyroidism, unspecified: Secondary | ICD-10-CM | POA: Insufficient documentation

## 2014-08-30 DIAGNOSIS — R079 Chest pain, unspecified: Secondary | ICD-10-CM | POA: Insufficient documentation

## 2014-08-30 DIAGNOSIS — E119 Type 2 diabetes mellitus without complications: Secondary | ICD-10-CM | POA: Insufficient documentation

## 2014-08-30 DIAGNOSIS — F329 Major depressive disorder, single episode, unspecified: Secondary | ICD-10-CM | POA: Diagnosis not present

## 2014-08-30 DIAGNOSIS — Z7982 Long term (current) use of aspirin: Secondary | ICD-10-CM | POA: Diagnosis not present

## 2014-08-30 DIAGNOSIS — I10 Essential (primary) hypertension: Secondary | ICD-10-CM | POA: Insufficient documentation

## 2014-08-30 DIAGNOSIS — Z79899 Other long term (current) drug therapy: Secondary | ICD-10-CM | POA: Diagnosis not present

## 2014-08-30 DIAGNOSIS — M069 Rheumatoid arthritis, unspecified: Secondary | ICD-10-CM | POA: Diagnosis not present

## 2014-08-30 DIAGNOSIS — Z87442 Personal history of urinary calculi: Secondary | ICD-10-CM | POA: Diagnosis not present

## 2014-08-30 LAB — CBC WITH DIFFERENTIAL/PLATELET
BASOS ABS: 0 10*3/uL (ref 0.0–0.1)
Basophils Relative: 0 % (ref 0–1)
EOS ABS: 0.2 10*3/uL (ref 0.0–0.7)
Eosinophils Relative: 3 % (ref 0–5)
HCT: 44 % (ref 36.0–46.0)
Hemoglobin: 14.9 g/dL (ref 12.0–15.0)
Lymphocytes Relative: 41 % (ref 12–46)
Lymphs Abs: 2.2 10*3/uL (ref 0.7–4.0)
MCH: 30.2 pg (ref 26.0–34.0)
MCHC: 33.9 g/dL (ref 30.0–36.0)
MCV: 89.1 fL (ref 78.0–100.0)
MONOS PCT: 10 % (ref 3–12)
Monocytes Absolute: 0.5 10*3/uL (ref 0.1–1.0)
NEUTROS PCT: 46 % (ref 43–77)
Neutro Abs: 2.4 10*3/uL (ref 1.7–7.7)
Platelets: 230 10*3/uL (ref 150–400)
RBC: 4.94 MIL/uL (ref 3.87–5.11)
RDW: 12.8 % (ref 11.5–15.5)
WBC: 5.3 10*3/uL (ref 4.0–10.5)

## 2014-08-30 LAB — BASIC METABOLIC PANEL
Anion gap: 12 (ref 5–15)
BUN: 18 mg/dL (ref 6–23)
CALCIUM: 9.7 mg/dL (ref 8.4–10.5)
CO2: 27 mEq/L (ref 19–32)
CREATININE: 1.02 mg/dL (ref 0.50–1.10)
Chloride: 101 mEq/L (ref 96–112)
GFR, EST AFRICAN AMERICAN: 59 mL/min — AB (ref >90)
GFR, EST NON AFRICAN AMERICAN: 51 mL/min — AB (ref >90)
GLUCOSE: 118 mg/dL — AB (ref 70–99)
Potassium: 4 mEq/L (ref 3.7–5.3)
Sodium: 140 mEq/L (ref 137–147)

## 2014-08-30 LAB — TROPONIN I

## 2014-08-30 MED ORDER — SODIUM CHLORIDE 0.9 % IV SOLN: INTRAVENOUS | Status: DC

## 2014-08-30 NOTE — ED Notes (Signed)
No Distress at this time. Pt is stable and accompanied by husband.

## 2014-08-30 NOTE — ED Provider Notes (Signed)
CSN: [id number]     Arrival date & time 08/30/14  1230 History   First MD Initiated Contact with Patient 08/30/14 1235     Chief Complaint  Patient presents with  . Anxiety  . Chest Pain     (Consider location/radiation/quality/duration/timing/severity/associated sxs/prior Treatment) HPI Comments: Patient here 45 minutes of upper sternal pain radiating down to her abdomen. She had some associated nausea but no diaphoresis or dyspnea. History of similar symptoms in the past associated with angina. Patient does have a history of coronary artery stent back in 2005. Denies any syncope or near-syncope. Has been under the radial stress recently but denies any prior history of panic disorder. Called her Dr. into the next dose of aspirin and was transported here by EMS. She is currently asymptomatic at this time.  Patient is a 80 y.o. female presenting with anxiety and chest pain. The history is provided by the spouse and the patient.  Anxiety Associated symptoms include chest pain.  Chest Pain Associated symptoms: anxiety     Past Medical History  Diagnosis Date  . Breast cancer   . Hypertension   . Arthritis   . Back pain   . Fatigue   . Anxiety   . Depression   . Hypothyroidism   . Diabetes mellitus without complication     diet controlled.   Marland Kitchen History of kidney stones    Past Surgical History  Procedure Laterality Date  . Tonsillectomy    . Abdominal hysterectomy    . Meniscus repair      left  . Angioplasty      x2  . Carpal tunnel release      bilateral  . Parathyroidectomy    . Breast lumpectomy      left x2  . Coronary angioplasty  2005    stent  . Eye surgery Bilateral     cataracts  . Breast lumpectomy with needle localization Right 07/05/2013    Procedure: RIGHT BREAST WIRE GUIDED LUMPECTOMY ;  Surgeon: Rolm Bookbinder, MD;  Location: [facility];  Service: General;  Laterality: Right;  . Re-excision of breast cancer,superior margins Right 08/01/2013    Procedure:  RE-EXCISION OF BREAST CANCER MARGIN;  Surgeon: Rolm Bookbinder, MD;  Location: [facility];  Service: General;  Laterality: Right;   Family History  Problem Relation Age of Onset  . Breast cancer Maternal Aunt 4    ? bilateral  . Cancer Mother 43    lung cancer   . Cancer Father 61    possible breast cancer  . Cancer Sister     lung cancer ? primary; paternal half sister  . Cancer Maternal Grandmother 43    prostate cancer   History  Substance Use Topics  . Smoking status: Never Smoker   . Smokeless tobacco: Never Used  . Alcohol Use: Yes     Comment: socially   OB History    No data available     Review of Systems  Cardiovascular: Positive for chest pain.  All other systems reviewed and are negative.     Allergies  Review of patient's allergies indicates no known allergies.  Home Medications   Prior to Admission medications   Medication Sig Start Date End Date Taking? Authorizing Provider  amLODipine (NORVASC) 5 MG tablet Take 5 mg by mouth daily.    Historical Provider, MD  aspirin 81 MG tablet Take 81 mg by mouth daily.    Historical Provider, MD  buPROPion (WELLBUTRIN) 100 MG tablet Take  200 mg by mouth every morning.     Historical Provider, MD  CALCIUM-MAGNESIUM-VITAMIN D PO Take 1 tablet by mouth 2 (two) times daily.    Historical Provider, MD  clonazePAM (KLONOPIN) 0.5 MG tablet Take 0.5 mg by mouth at bedtime as needed for anxiety.    Historical Provider, MD  clopidogrel (PLAVIX) 75 MG tablet Take by mouth. 01/16/14   Historical Provider, MD  Cyanocobalamin (B-12 PO) Take 1 tablet by mouth daily.    Historical Provider, MD  DULoxetine (CYMBALTA) 60 MG capsule Take 60 mg by mouth 2 (two) times daily.    Historical Provider, MD  Estradiol (VAGIFEM) 10 MCG TABS vaginal tablet Place 1 tablet (10 mcg total) vaginally See admin instructions. 2 tablets twice a wk for 2 weeks.  Then 1 tablet a week. 07/17/14   Rulon Eisenmenger, MD  ezetimibe-simvastatin (VYTORIN) 10-20 MG  per tablet Take 1 tablet by mouth See admin instructions. Take every other day    Historical Provider, MD  famotidine (PEPCID) 20 MG tablet Take 20 mg by mouth at bedtime.     Historical Provider, MD  hydrocodone-acetaminophen (LORCET-HD) 5-500 MG per capsule Take by mouth. 12/15/13   Historical Provider, MD  letrozole (FEMARA) 2.5 MG tablet TAKE 1 TABLET DAILY 07/27/14   Rulon Eisenmenger, MD  levothyroxine (SYNTHROID, LEVOTHROID) 25 MCG tablet Take 25 mcg by mouth daily.    Historical Provider, MD  lisinopril (PRINIVIL,ZESTRIL) 20 MG tablet Take 20 mg by mouth daily.    Historical Provider, MD  Magnesium Oxide 420 MG TABS Take by mouth. 01/19/14   Historical Provider, MD  metoprolol succinate (TOPROL-XL) 50 MG 24 hr tablet Take 50 mg by mouth daily. Take with or immediately following a meal.    Historical Provider, MD  Prenatal Vit-Fe Fumarate-FA (PRENATAL MULTIVITAMIN) TABS tablet Take 1 tablet by mouth daily at 12 noon.    Historical Provider, MD  rOPINIRole (REQUIP) 0.25 MG tablet Take 0.5 mg by mouth at bedtime.    Historical Provider, MD   BP 143/71 mmHg  Pulse 75  Temp(Src) 98.2 F (36.8 C) (Oral)  Resp 14  Ht 5' (1.524 m)  Wt 161 lb (73.029 kg)  BMI 31.44 kg/m2  SpO2 95% Physical Exam  Constitutional: She is oriented to person, place, and time. She appears well-developed and well-nourished.  Non-toxic appearance. No distress.  HENT:  Head: Normocephalic and atraumatic.  Eyes: Conjunctivae, EOM and lids are normal. Pupils are equal, round, and reactive to light.  Neck: Normal range of motion. Neck supple. No tracheal deviation present. No thyroid mass present.  Cardiovascular: Normal rate, regular rhythm and normal heart sounds.  Exam reveals no gallop.   No murmur heard. Pulmonary/Chest: Effort normal and breath sounds normal. No stridor. No respiratory distress. She has no decreased breath sounds. She has no wheezes. She has no rhonchi. She has no rales.  Abdominal: Soft. Normal  appearance and bowel sounds are normal. She exhibits no distension. There is no tenderness. There is no rebound and no CVA tenderness.  Musculoskeletal: Normal range of motion. She exhibits no edema or tenderness.  Neurological: She is alert and oriented to person, place, and time. She has normal strength. No cranial nerve deficit or sensory deficit. GCS eye subscore is 4. GCS verbal subscore is 5. GCS motor subscore is 6.  Skin: Skin is warm and dry. No abrasion and no rash noted.  Psychiatric: Her speech is normal and behavior is normal. Her affect is blunt.  Nursing  note and vitals reviewed.   ED Course  Procedures (including critical care time) Labs Review Labs Reviewed  TROPONIN I  CBC WITH DIFFERENTIAL  BASIC METABOLIC PANEL    Imaging Review No results found.   EKG Interpretation None      MDM   Final diagnoses:  Chest pain     Date: 08/30/2014  Rate: 75  Rhythm: normal sinus rhythm  QRS Axis: normal  Intervals: normal  ST/T Wave abnormalities: nonspecific T wave changes  Conduction Disutrbances:none  Narrative Interpretation:   Old EKG Reviewed: unchanged   Patient offered admission for evaluation of her chest discomfort. She has deferred at this time. Risk of sudden cardiac death explained to her and she accepts this. Return precautions given.   Leota Jacobsen, MD 08/30/14 [phone]

## 2014-08-30 NOTE — Discharge Instructions (Signed)

## 2014-08-30 NOTE — ED Notes (Signed)
Pt was willing to Esign, it would not except her signature.

## 2014-08-30 NOTE — ED Notes (Signed)
Patient transported to X-ray 

## 2014-08-30 NOTE — ED Notes (Signed)
About 10:30 had an episode of upper chest tightness and throat tightness. States she has a history of stent and angina, but also a history of anxiety with a lot of family stressors lately that she has had to start seeing a counselor and medication doses increased. Took 3 baby Asprins in case this was angina and not anxiety. Husband reports she was highly anxious when he arrived home. Felt nauseated, no vomiting, no diaphoresis, no dizziness. Currently reports throat tightness, cp has resolved to barely noticeable, but has a headache.

## 2014-11-16 DIAGNOSIS — K219 Gastro-esophageal reflux disease without esophagitis: Secondary | ICD-10-CM | POA: Diagnosis present

## 2014-11-16 HISTORY — DX: Gastro-esophageal reflux disease without esophagitis: K21.9

## 2015-01-22 ENCOUNTER — Other Ambulatory Visit: Payer: Self-pay

## 2015-01-22 DIAGNOSIS — C50312 Malignant neoplasm of lower-inner quadrant of left female breast: Secondary | ICD-10-CM

## 2015-01-23 ENCOUNTER — Telehealth: Payer: Self-pay | Admitting: Hematology and Oncology

## 2015-01-23 ENCOUNTER — Other Ambulatory Visit: Payer: Self-pay | Admitting: Hematology and Oncology

## 2015-01-23 ENCOUNTER — Other Ambulatory Visit (HOSPITAL_BASED_OUTPATIENT_CLINIC_OR_DEPARTMENT_OTHER): Payer: Medicare Other

## 2015-01-23 ENCOUNTER — Ambulatory Visit (HOSPITAL_BASED_OUTPATIENT_CLINIC_OR_DEPARTMENT_OTHER): Payer: Medicare Other | Admitting: Hematology and Oncology

## 2015-01-23 VITALS — BP 130/72 | HR 66 | Temp 97.8°F | Resp 18 | Ht 60.0 in | Wt 171.3 lb

## 2015-01-23 DIAGNOSIS — Z79810 Long term (current) use of selective estrogen receptor modulators (SERMs): Secondary | ICD-10-CM

## 2015-01-23 DIAGNOSIS — Z853 Personal history of malignant neoplasm of breast: Secondary | ICD-10-CM | POA: Diagnosis present

## 2015-01-23 DIAGNOSIS — R6882 Decreased libido: Secondary | ICD-10-CM | POA: Diagnosis not present

## 2015-01-23 DIAGNOSIS — C50312 Malignant neoplasm of lower-inner quadrant of left female breast: Secondary | ICD-10-CM

## 2015-01-23 LAB — COMPREHENSIVE METABOLIC PANEL (CC13)
ALT: 22 U/L (ref 0–55)
ANION GAP: 12 meq/L — AB (ref 3–11)
AST: 17 U/L (ref 5–34)
Albumin: 3.7 g/dL (ref 3.5–5.0)
Alkaline Phosphatase: 54 U/L (ref 40–150)
BUN: 25 mg/dL (ref 7.0–26.0)
CALCIUM: 9.2 mg/dL (ref 8.4–10.4)
CHLORIDE: 103 meq/L (ref 98–109)
CO2: 27 meq/L (ref 22–29)
CREATININE: 1.1 mg/dL (ref 0.6–1.1)
EGFR: 45 mL/min/{1.73_m2} — ABNORMAL LOW (ref >90)
GLUCOSE: 118 mg/dL (ref 70–140)
Potassium: 4.8 mEq/L (ref 3.5–5.1)
Sodium: 142 mEq/L (ref 136–145)
TOTAL PROTEIN: 6.2 g/dL — AB (ref 6.4–8.3)
Total Bilirubin: 0.5 mg/dL (ref 0.20–1.20)

## 2015-01-23 LAB — CBC WITH DIFFERENTIAL/PLATELET
BASO%: 0.3 % (ref 0.0–2.0)
Basophils Absolute: 0 10*3/uL (ref 0.0–0.1)
EOS%: 3.9 % (ref 0.0–7.0)
Eosinophils Absolute: 0.2 10*3/uL (ref 0.0–0.5)
HEMATOCRIT: 43.4 % (ref 34.8–46.6)
HGB: 14.4 g/dL (ref 11.6–15.9)
LYMPH%: 42.6 % (ref 14.0–49.7)
MCH: 30.8 pg (ref 25.1–34.0)
MCHC: 33.2 g/dL (ref 31.5–36.0)
MCV: 92.7 fL (ref 79.5–101.0)
MONO#: 0.6 10*3/uL (ref 0.1–0.9)
MONO%: 10.3 % (ref 0.0–14.0)
NEUT#: 2.6 10*3/uL (ref 1.5–6.5)
NEUT%: 42.9 % (ref 38.4–76.8)
Platelets: 254 10*3/uL (ref 145–400)
RBC: 4.68 10*6/uL (ref 3.70–5.45)
RDW: 12.9 % (ref 11.2–14.5)
WBC: 6.1 10*3/uL (ref 3.9–10.3)
lymph#: 2.6 10*3/uL (ref 0.9–3.3)

## 2015-01-23 NOTE — Progress Notes (Signed)
Patient Care Team: Linus Mako, NP as PCP - General (Nurse Practitioner)  DIAGNOSIS: Cancer of lower-inner quadrant of left female breast   Staging form: Breast, AJCC 7th Edition     Clinical: Stage IIA (T2, N0, cM0) - Unsigned       Staging comments: Staged at breast conference 1.15.14      Pathologic: No stage assigned - Unsigned   SUMMARY OF ONCOLOGIC HISTORY:   Cancer of lower-inner quadrant of left female breast   10/12/2012 Initial Diagnosis Right breast biopsy: Invasive ductal carcinoma with DCIS ER 100%, PR 100%, Ki-67 15%, HER-2 negative ratio 1.13   10/13/2012 - 07/18/2013 Anti-estrogen oral therapy Neoadjuvant Antiestrogen therapy with Femara 2.5 mg daily   07/05/2013 Surgery Right breast lumpectomy: Invasive ductal carcinoma grade 1-2.2 cm with low-grade DCIS, the excision margins negative   08/17/2013 -  Anti-estrogen oral therapy Letrozole 2.5 mg daily for 5 years    CHIEF COMPLIANT: Follow-up of breast cancer on letrozole  INTERVAL HISTORY: Wendy Roth is a 81 year old lady with above-mentioned history of right-sided breast cancer treated with lumpectomy and is currently omeprazole 2.5 mg daily. She is tolerating extremely well without any major problems or concerns. She does not have hot flashes but she has a lot of arthritis symptoms. She applies blue emu and apparently helps her pains. Denies any lumps or nodules in the breasts.  REVIEW OF SYSTEMS:   Constitutional: Denies fevers, chills or abnormal weight loss Eyes: Denies blurriness of vision Ears, nose, mouth, throat, and face: Denies mucositis or sore throat Respiratory: Denies cough, dyspnea or wheezes Cardiovascular: Denies palpitation, chest discomfort or lower extremity swelling Gastrointestinal:  Denies nausea, heartburn or change in bowel habits Skin: Denies abnormal skin rashes Lymphatics: Denies new lymphadenopathy or easy bruising Neurological:Denies numbness, tingling or new  weaknesses Behavioral/Psych: Mood is stable, no new changes  Breast:  denies any pain or lumps or nodules in either breasts All other systems were reviewed with the patient and are negative.  I have reviewed the past medical history, past surgical history, social history and family history with the patient and they are unchanged from previous note.  ALLERGIES:  has No Known Allergies.  MEDICATIONS:  Current Outpatient Prescriptions  Medication Sig Dispense Refill  . amLODipine (NORVASC) 5 MG tablet Take 5 mg by mouth daily.    Marland Kitchen aspirin 81 MG tablet Take 81 mg by mouth daily.    Marland Kitchen buPROPion (WELLBUTRIN SR) 200 MG 12 hr tablet Take 200 mg by mouth daily.    Marland Kitchen CALCIUM-MAGNESIUM-VITAMIN D PO Take 1 tablet by mouth 2 (two) times daily.    . clonazePAM (KLONOPIN) 0.5 MG tablet Take 0.5 mg by mouth at bedtime as needed for anxiety.    . Cyanocobalamin (B-12 PO) Take 1 tablet by mouth daily.    . DULoxetine (CYMBALTA) 60 MG capsule Take 60 mg by mouth 2 (two) times daily.    Marland Kitchen ezetimibe-simvastatin (VYTORIN) 10-20 MG per tablet Take 1 tablet by mouth See admin instructions. Take every other day    . famotidine (PEPCID) 20 MG tablet Take 20 mg by mouth at bedtime.     Marland Kitchen ipratropium (ATROVENT) 0.03 % nasal spray 2 sprays by Each Nare route Three (3) times a day.    . letrozole (FEMARA) 2.5 MG tablet TAKE 1 TABLET DAILY 90 tablet 2  . levothyroxine (SYNTHROID, LEVOTHROID) 25 MCG tablet Take 75 mcg by mouth daily.     Marland Kitchen lisinopril (PRINIVIL,ZESTRIL) 20 MG tablet Take 20  mg by mouth daily.    Marland Kitchen loratadine (CLARITIN) 10 MG tablet Take 10 mg by mouth.    . Magnesium Oxide 420 MG TABS Take by mouth.    . metoprolol succinate (TOPROL-XL) 50 MG 24 hr tablet Take 50 mg by mouth daily. Take with or immediately following a meal.    . omeprazole (PRILOSEC) 20 MG capsule Take 20 mg by mouth.    . Prenatal Vit-Fe Fumarate-FA (PRENATAL MULTIVITAMIN) TABS tablet Take 1 tablet by mouth daily at 12 noon.    Marland Kitchen  rOPINIRole (REQUIP) 0.25 MG tablet Take 0.5 mg by mouth at bedtime.     No current facility-administered medications for this visit.    PHYSICAL EXAMINATION: ECOG PERFORMANCE STATUS: 1 - Symptomatic but completely ambulatory  Filed Vitals:   01/23/15 1143  BP: 130/72  Pulse: 66  Temp: 97.8 F (36.6 C)  Resp: 18   Filed Weights   01/23/15 1143  Weight: 171 lb 4.8 oz (77.701 kg)    GENERAL:alert, no distress and comfortable SKIN: skin color, texture, turgor are normal, no rashes or significant lesions EYES: normal, Conjunctiva are pink and non-injected, sclera clear OROPHARYNX:no exudate, no erythema and lips, buccal mucosa, and tongue normal  NECK: supple, thyroid normal size, non-tender, without nodularity LYMPH:  no palpable lymphadenopathy in the cervical, axillary or inguinal LUNGS: clear to auscultation and percussion with normal breathing effort HEART: regular rate & rhythm and no murmurs and no lower extremity edema ABDOMEN:abdomen soft, non-tender and normal bowel sounds Musculoskeletal:no cyanosis of digits and no clubbing  NEURO: alert & oriented x 3 with fluent speech, no focal motor/sensory deficits BREAST: No palpable masses or nodules in either right or left breasts. No palpable axillary supraclavicular or infraclavicular adenopathy no breast tenderness or nipple discharge. (exam performed in the presence of a chaperone)  LABORATORY DATA:  I have reviewed the data as listed   Chemistry      Component Value Date/Time   NA 142 01/23/2015 1017   NA 140 08/30/2014 1300   K 4.8 01/23/2015 1017   K 4.0 08/30/2014 1300   CL 101 08/30/2014 1300   CL 105 02/21/2013 0905   CO2 27 01/23/2015 1017   CO2 27 08/30/2014 1300   BUN 25.0 01/23/2015 1017   BUN 18 08/30/2014 1300   CREATININE 1.1 01/23/2015 1017   CREATININE 1.02 08/30/2014 1300      Component Value Date/Time   CALCIUM 9.2 01/23/2015 1017   CALCIUM 9.7 08/30/2014 1300   ALKPHOS 54 01/23/2015 1017    ALKPHOS 52 01/28/2014 1455   AST 17 01/23/2015 1017   AST 17 01/28/2014 1455   ALT 22 01/23/2015 1017   ALT 20 01/28/2014 1455   BILITOT 0.50 01/23/2015 1017   BILITOT 0.7 01/28/2014 1455       Lab Results  Component Value Date   WBC 6.1 01/23/2015   HGB 14.4 01/23/2015   HCT 43.4 01/23/2015   MCV 92.7 01/23/2015   PLT 254 01/23/2015   NEUTROABS 2.6 01/23/2015   ASSESSMENT & PLAN:  Right breast invasive ductal carcinoma T2, N0, M0 stage II A. with DCIS ER was n.p.o. 100% HER-2 negative Ki-67 15% treated with neoadjuvant antiestrogen therapy followed by left lumpectomy followed by antiestrogen therapy with letrozole since October 2014.   Letrozole toxicities:  1. Arthritis and myalgias  2. Loss of libido  Otherwise tolerating letrozole extremely well without any major problems or concerns.  Breast cancer surveillance: Breast exam 01/23/2015 is normal  Annual  mammograms   Loss of libido: In spite of using topical estrogen. Return to clinic in 6 months for followup  No orders of the defined types were placed in this encounter.   The patient has a good understanding of the overall plan. she agrees with it. She will call with any problems that may develop before her next visit here.   Rulon Eisenmenger, MD

## 2015-01-23 NOTE — Progress Notes (Signed)
Bone Density report received from [facility] 01/11/15.  Reviewed by Dr. Lindi Adie.  Sent to scan.

## 2015-01-23 NOTE — Telephone Encounter (Signed)
Gave patient avs report and appointment for October 2016. No pof sent for patient per pt and office patient to return for f/u in 6 weeks - message to [facility] to confirm.

## 2015-02-19 ENCOUNTER — Other Ambulatory Visit: Payer: Self-pay

## 2015-02-19 DIAGNOSIS — Z9889 Other specified postprocedural states: Secondary | ICD-10-CM

## 2015-03-08 ENCOUNTER — Other Ambulatory Visit: Payer: Self-pay | Admitting: Hematology and Oncology

## 2015-03-08 DIAGNOSIS — Z9889 Other specified postprocedural states: Secondary | ICD-10-CM

## 2015-03-08 DIAGNOSIS — Z853 Personal history of malignant neoplasm of breast: Secondary | ICD-10-CM

## 2015-03-12 ENCOUNTER — Ambulatory Visit
Admission: RE | Admit: 2015-03-12 | Discharge: 2015-03-12 | Disposition: A | Discharge status: Home / Self Care | Payer: Medicare Other | Source: Ambulatory Visit

## 2015-03-12 DIAGNOSIS — Z853 Personal history of malignant neoplasm of breast: Secondary | ICD-10-CM

## 2015-03-27 ENCOUNTER — Other Ambulatory Visit: Payer: Self-pay | Admitting: Hematology and Oncology

## 2015-03-27 NOTE — Telephone Encounter (Signed)
Last ov 01/23/15.  Next ov 07/24/15.  Chart reviewed

## 2015-04-02 ENCOUNTER — Other Ambulatory Visit: Payer: Self-pay

## 2015-07-24 ENCOUNTER — Encounter: Payer: Self-pay | Admitting: Hematology and Oncology

## 2015-07-24 ENCOUNTER — Ambulatory Visit (HOSPITAL_BASED_OUTPATIENT_CLINIC_OR_DEPARTMENT_OTHER): Payer: Medicare Other | Admitting: Hematology and Oncology

## 2015-07-24 VITALS — BP 124/59 | HR 67 | Temp 98.2°F | Resp 18 | Ht 60.0 in | Wt 162.4 lb

## 2015-07-24 DIAGNOSIS — C50312 Malignant neoplasm of lower-inner quadrant of left female breast: Secondary | ICD-10-CM

## 2015-07-24 DIAGNOSIS — Z853 Personal history of malignant neoplasm of breast: Secondary | ICD-10-CM | POA: Diagnosis present

## 2015-07-24 NOTE — Progress Notes (Signed)
Patient Care Team: Linus Mako, NP as PCP - General (Nurse Practitioner)  DIAGNOSIS: Cancer of lower-inner quadrant of left female breast [facility])   Staging form: Breast, AJCC 7th Edition     Clinical: Stage IIA (T2, N0, cM0) - Unsigned       Staging comments: Staged at breast conference 1.15.14      Pathologic: No stage assigned - Unsigned    SUMMARY OF ONCOLOGIC HISTORY:   Cancer of lower-inner quadrant of left female breast ([place])   10/12/2012 Initial Diagnosis Right breast biopsy: Invasive ductal carcinoma with DCIS ER 100%, PR 100%, Ki-67 15%, HER-2 negative ratio 1.13   10/13/2012 - 07/18/2013 Anti-estrogen oral therapy Neoadjuvant Antiestrogen therapy with Femara 2.5 mg daily   07/05/2013 Surgery Right breast lumpectomy: Invasive ductal carcinoma grade 1-2.2 cm with low-grade DCIS, the excision margins negative   08/17/2013 -  Anti-estrogen oral therapy Letrozole 2.5 mg daily for 5 years    CHIEF COMPLIANT: Follow-up on letrozole  INTERVAL HISTORY: Wendy Roth is a 81 year old with above-mentioned history of left breast cancer who underwent lumpectomy and is now on adjuvant antiestrogen therapy. She appears to be tolerating it extremely well. She does not have any hot flashes or myalgias. She had a mammogram in June which was normal. She denies any lumps or nodules in the breasts.  REVIEW OF SYSTEMS:   Constitutional: Denies fevers, chills or abnormal weight loss Eyes: Denies blurriness of vision Ears, nose, mouth, throat, and face: Denies mucositis or sore throat Respiratory: Denies cough, dyspnea or wheezes Cardiovascular: Denies palpitation, chest discomfort or lower extremity swelling Gastrointestinal:  Denies nausea, heartburn or change in bowel habits Skin: Denies abnormal skin rashes Lymphatics: Denies new lymphadenopathy or easy bruising Neurological:Denies numbness, tingling or new weaknesses Behavioral/Psych: Mood is stable, no new changes  Breast:  denies any  pain or lumps or nodules in either breasts All other systems were reviewed with the patient and are negative.  I have reviewed the past medical history, past surgical history, social history and family history with the patient and they are unchanged from previous note.  ALLERGIES:  has No Known Allergies.  MEDICATIONS:  Current Outpatient Prescriptions  Medication Sig Dispense Refill  . amLODipine (NORVASC) 5 MG tablet Take 5 mg by mouth daily.    Marland Kitchen aspirin 81 MG tablet Take 81 mg by mouth daily.    Marland Kitchen buPROPion (WELLBUTRIN SR) 200 MG 12 hr tablet Take 200 mg by mouth daily.    Marland Kitchen CALCIUM-MAGNESIUM-VITAMIN D PO Take 1 tablet by mouth 2 (two) times daily.    . clonazePAM (KLONOPIN) 0.5 MG tablet Take 0.5 mg by mouth at bedtime as needed for anxiety.    . Cyanocobalamin (B-12 PO) Take 1 tablet by mouth daily.    . DULoxetine (CYMBALTA) 60 MG capsule Take 60 mg by mouth 2 (two) times daily.    Marland Kitchen ezetimibe-simvastatin (VYTORIN) 10-20 MG per tablet Take 1 tablet by mouth See admin instructions. Take every other day    . famotidine (PEPCID) 20 MG tablet Take 20 mg by mouth at bedtime.     Marland Kitchen ipratropium (ATROVENT) 0.03 % nasal spray 2 sprays by Each Nare route Three (3) times a day.    . letrozole (FEMARA) 2.5 MG tablet TAKE 1 TABLET DAILY 90 tablet 1  . levothyroxine (SYNTHROID, LEVOTHROID) 25 MCG tablet Take 75 mcg by mouth daily.     Marland Kitchen lisinopril (PRINIVIL,ZESTRIL) 20 MG tablet Take 20 mg by mouth daily.    Marland Kitchen  loratadine (CLARITIN) 10 MG tablet Take 10 mg by mouth.    . Magnesium Oxide 420 MG TABS Take by mouth.    . metoprolol succinate (TOPROL-XL) 50 MG 24 hr tablet Take 50 mg by mouth daily. Take with or immediately following a meal.    . omeprazole (PRILOSEC) 20 MG capsule Take 20 mg by mouth.    . Prenatal Vit-Fe Fumarate-FA (PRENATAL MULTIVITAMIN) TABS tablet Take 1 tablet by mouth daily at 12 noon.    Marland Kitchen rOPINIRole (REQUIP) 0.25 MG tablet Take 0.5 mg by mouth at bedtime.     No current  facility-administered medications for this visit.    PHYSICAL EXAMINATION: ECOG PERFORMANCE STATUS: 1 - Symptomatic but completely ambulatory  Filed Vitals:   07/24/15 1416  BP: 124/59  Pulse: 67  Temp: 98.2 F (36.8 C)  Resp: 18   Filed Weights   07/24/15 1416  Weight: 162 lb 6 oz (73.653 kg)    GENERAL:alert, no distress and comfortable SKIN: skin color, texture, turgor are normal, no rashes or significant lesions EYES: normal, Conjunctiva are pink and non-injected, sclera clear OROPHARYNX:no exudate, no erythema and lips, buccal mucosa, and tongue normal  NECK: supple, thyroid normal size, non-tender, without nodularity LYMPH:  no palpable lymphadenopathy in the cervical, axillary or inguinal LUNGS: clear to auscultation and percussion with normal breathing effort HEART: regular rate & rhythm and no murmurs and no lower extremity edema ABDOMEN:abdomen soft, non-tender and normal bowel sounds Musculoskeletal:no cyanosis of digits and no clubbing  NEURO: alert & oriented x 3 with fluent speech, no focal motor/sensory deficits BREAST: No palpable masses or nodules in either right or left breasts. No palpable axillary supraclavicular or infraclavicular adenopathy no breast tenderness or nipple discharge. (exam performed in the presence of a chaperone)  LABORATORY DATA:  I have reviewed the data as listed   Chemistry      Component Value Date/Time   NA 142 01/23/2015 1017   NA 140 08/30/2014 1300   K 4.8 01/23/2015 1017   K 4.0 08/30/2014 1300   CL 101 08/30/2014 1300   CL 105 02/21/2013 0905   CO2 27 01/23/2015 1017   CO2 27 08/30/2014 1300   BUN 25.0 01/23/2015 1017   BUN 18 08/30/2014 1300   CREATININE 1.1 01/23/2015 1017   CREATININE 1.02 08/30/2014 1300      Component Value Date/Time   CALCIUM 9.2 01/23/2015 1017   CALCIUM 9.7 08/30/2014 1300   ALKPHOS 54 01/23/2015 1017   ALKPHOS 52 01/28/2014 1455   AST 17 01/23/2015 1017   AST 17 01/28/2014 1455   ALT 22  01/23/2015 1017   ALT 20 01/28/2014 1455   BILITOT 0.50 01/23/2015 1017   BILITOT 0.7 01/28/2014 1455       Lab Results  Component Value Date   WBC 6.1 01/23/2015   HGB 14.4 01/23/2015   HCT 43.4 01/23/2015   MCV 92.7 01/23/2015   PLT 254 01/23/2015   NEUTROABS 2.6 01/23/2015     RADIOGRAPHIC STUDIES: I have personally reviewed the radiology reports and agreed with their findings. Mammogram June 2016 is normal breast density categories evidence of prior lumpectomy.  ASSESSMENT & PLAN:  Cancer of lower-inner quadrant of left female breast Right breast invasive ductal carcinoma T2, N0, M0 stage II A. with DCIS ER was n.p.o. 100% HER-2 negative Ki-67 15% treated with neoadjuvant antiestrogen therapy followed by left lumpectomy followed by antiestrogen therapy with letrozole since October 2014.   Letrozole toxicities:  1. Arthritis and  myalgias  2. Loss of libido  Otherwise tolerating letrozole extremely well without any major problems or concerns.  Breast cancer surveillance: Breast exam 07/24/2015 is normal  Annual mammograms 03/12/2015: Normal. breast density category C  Return to clinic in 1 year for followup  No orders of the defined types were placed in this encounter.   The patient has a good understanding of the overall plan. she agrees with it. she will call with any problems that may develop before the next visit here.   Rulon Eisenmenger, MD 07/24/2015

## 2015-07-24 NOTE — Assessment & Plan Note (Signed)
Right breast invasive ductal carcinoma T2, N0, M0 stage II A. with DCIS ER was n.p.o. 100% HER-2 negative Ki-67 15% treated with neoadjuvant antiestrogen therapy followed by left lumpectomy followed by antiestrogen therapy with letrozole since October 2014.   Letrozole toxicities:  1. Arthritis and myalgias  2. Loss of libido  Otherwise tolerating letrozole extremely well without any major problems or concerns.  Breast cancer surveillance: Breast exam 07/24/2015 is normal  Annual mammograms 03/12/2015: Stable bilateral lumpectomies. breast density category C  Loss of libido: In spite of using topical estrogen. Return to clinic in 6 months for followup

## 2015-07-24 NOTE — Addendum Note (Signed)
Addended by: Prentiss Bells on: 07/24/2015 05:25 PM   Modules accepted: Medications

## 2015-09-21 ENCOUNTER — Other Ambulatory Visit: Payer: Self-pay | Admitting: Hematology and Oncology

## 2015-09-21 NOTE — Telephone Encounter (Signed)
Chart reviewed.

## 2015-10-07 HISTORY — PX: BREAST LUMPECTOMY: SHX2

## 2016-03-14 ENCOUNTER — Other Ambulatory Visit: Payer: Self-pay | Admitting: Hematology and Oncology

## 2016-03-14 DIAGNOSIS — Z853 Personal history of malignant neoplasm of breast: Secondary | ICD-10-CM

## 2016-03-14 DIAGNOSIS — Z9889 Other specified postprocedural states: Secondary | ICD-10-CM

## 2016-03-19 ENCOUNTER — Other Ambulatory Visit: Payer: Self-pay | Admitting: Hematology and Oncology

## 2016-04-04 ENCOUNTER — Ambulatory Visit
Admission: RE | Admit: 2016-04-04 | Discharge: 2016-04-04 | Disposition: A | Discharge status: Home / Self Care | Payer: Medicare Other | Source: Ambulatory Visit | Attending: Hematology and Oncology | Admitting: Hematology and Oncology

## 2016-04-04 DIAGNOSIS — Z853 Personal history of malignant neoplasm of breast: Secondary | ICD-10-CM

## 2016-04-04 DIAGNOSIS — Z9889 Other specified postprocedural states: Secondary | ICD-10-CM

## 2016-05-22 ENCOUNTER — Other Ambulatory Visit: Payer: Self-pay | Admitting: General Surgery

## 2016-06-10 ENCOUNTER — Encounter (HOSPITAL_COMMUNITY): Payer: Self-pay | Admitting: *Deleted

## 2016-06-10 MED ORDER — CEFAZOLIN SODIUM-DEXTROSE 2-4 GM/100ML-% IV SOLN
2.0000 g | INTRAVENOUS | Status: AC
  Administered 2016-06-11: 2 g via INTRAVENOUS
  Filled 2016-06-10: qty 100

## 2016-06-10 MED ORDER — ACETAMINOPHEN 500 MG PO TABS
1000.0000 mg | ORAL_TABLET | ORAL | Status: AC
  Administered 2016-06-11: 1000 mg via ORAL
  Filled 2016-06-10: qty 2

## 2016-06-10 MED ORDER — HEPARIN SODIUM (PORCINE) 5000 UNIT/ML IJ SOLN
5000.0000 [IU] | INTRAMUSCULAR | Status: AC
  Administered 2016-06-11: 5000 [IU] via SUBCUTANEOUS
  Filled 2016-06-10: qty 1

## 2016-06-10 NOTE — Anesthesia Preprocedure Evaluation (Addendum)
Anesthesia Evaluation  Patient identified by MRN, date of birth, ID band Patient awake    Reviewed: Allergy & Precautions, NPO status , Patient's Chart, lab work & pertinent test results, reviewed documented beta blocker date and time   History of Anesthesia Complications Negative for: history of anesthetic complications  Airway Mallampati: III  TM Distance: >3 FB Neck ROM: Full    Dental  (+) Dental Advisory Given, Lower Dentures, Upper Dentures   Pulmonary neg pulmonary ROS, neg shortness of breath, neg sleep apnea, neg COPD, neg recent URI,    Pulmonary exam normal breath sounds clear to auscultation       Cardiovascular hypertension, Pt. on medications and Pt. on home beta blockers (-) angina+ CAD and + Cardiac Stents (2005)  (-) CABG and (-) Orthopnea  Rhythm:Regular Rate:Normal     Neuro/Psych Seizures - (when she was 2 months old),  PSYCHIATRIC DISORDERS Anxiety Depression RLS    GI/Hepatic Neg liver ROS, GERD  Medicated,  Endo/Other  diabetes (diet-controlled), Type 2Hypothyroidism   Renal/GU Renal disease (h/o nephrolithiasis)     Musculoskeletal  (+) Arthritis , Back pain   Abdominal (+) + obese,   Peds  Hematology negative hematology ROS (+)   Anesthesia Other Findings H/o breast cancer, glaucoma  Reproductive/Obstetrics                           Anesthesia Physical Anesthesia Plan  ASA: III  Anesthesia Plan: General   Post-op Pain Management:    Induction: Intravenous  Airway Management Planned: Oral ETT  Additional Equipment:   Intra-op Plan:   Post-operative Plan: Extubation in OR  Informed Consent: I have reviewed the patients History and Physical, chart, labs and discussed the procedure including the risks, benefits and alternatives for the proposed anesthesia with the patient or authorized representative who has indicated his/her understanding and acceptance.    Dental advisory given  Plan Discussed with:   Anesthesia Plan Comments: (Risks of general anesthesia discussed including, but not limited to, sore throat, hoarse voice, chipped/damaged teeth, injury to vocal cords, nausea and vomiting, allergic reactions, lung infection, heart attack, stroke, and death. All questions answered. )       Anesthesia Quick Evaluation

## 2016-06-10 NOTE — Progress Notes (Signed)
Pt has hx of CAD with stent placement in 2005. Denies any recent chest pain or sob. Dr. Wynonia Lawman is her cardiologist and has seen him recently and got a cardiac clearance from him. Pt is diabetic, diet controlled. States she does not check her blood sugar at home.  Have requested cardiac clearance, recent EKG and cardiac studies and last OV notes from Dr. Thurman Coyer office.

## 2016-06-11 ENCOUNTER — Encounter (HOSPITAL_COMMUNITY): Payer: Self-pay | Admitting: *Deleted

## 2016-06-11 ENCOUNTER — Ambulatory Visit (HOSPITAL_COMMUNITY): Payer: Medicare Other | Admitting: Certified Registered Nurse Anesthetist

## 2016-06-11 ENCOUNTER — Encounter (HOSPITAL_COMMUNITY)
Admission: RE | Disposition: A | Discharge status: Nursing Home (Includes ICF) | Payer: Self-pay | Source: Ambulatory Visit | Attending: General Surgery

## 2016-06-11 ENCOUNTER — Observation Stay (HOSPITAL_COMMUNITY)
Admission: RE | Admit: 2016-06-11 | Discharge: 2016-06-13 | Disposition: A | Discharge status: Nursing Home (Includes ICF) | Payer: Medicare Other | Source: Ambulatory Visit | Attending: General Surgery | Admitting: General Surgery

## 2016-06-11 DIAGNOSIS — E039 Hypothyroidism, unspecified: Secondary | ICD-10-CM | POA: Diagnosis not present

## 2016-06-11 DIAGNOSIS — E7800 Pure hypercholesterolemia, unspecified: Secondary | ICD-10-CM | POA: Diagnosis not present

## 2016-06-11 DIAGNOSIS — K219 Gastro-esophageal reflux disease without esophagitis: Secondary | ICD-10-CM | POA: Insufficient documentation

## 2016-06-11 DIAGNOSIS — Z8541 Personal history of malignant neoplasm of cervix uteri: Secondary | ICD-10-CM | POA: Insufficient documentation

## 2016-06-11 DIAGNOSIS — Z6832 Body mass index (BMI) 32.0-32.9, adult: Secondary | ICD-10-CM | POA: Diagnosis not present

## 2016-06-11 DIAGNOSIS — H409 Unspecified glaucoma: Secondary | ICD-10-CM | POA: Diagnosis not present

## 2016-06-11 DIAGNOSIS — Z79899 Other long term (current) drug therapy: Secondary | ICD-10-CM | POA: Insufficient documentation

## 2016-06-11 DIAGNOSIS — Z853 Personal history of malignant neoplasm of breast: Secondary | ICD-10-CM | POA: Insufficient documentation

## 2016-06-11 DIAGNOSIS — Z85820 Personal history of malignant melanoma of skin: Secondary | ICD-10-CM | POA: Insufficient documentation

## 2016-06-11 DIAGNOSIS — K420 Umbilical hernia with obstruction, without gangrene: Principal | ICD-10-CM | POA: Insufficient documentation

## 2016-06-11 DIAGNOSIS — Z7982 Long term (current) use of aspirin: Secondary | ICD-10-CM | POA: Diagnosis not present

## 2016-06-11 DIAGNOSIS — K429 Umbilical hernia without obstruction or gangrene: Secondary | ICD-10-CM | POA: Diagnosis present

## 2016-06-11 DIAGNOSIS — Z79811 Long term (current) use of aromatase inhibitors: Secondary | ICD-10-CM | POA: Diagnosis not present

## 2016-06-11 DIAGNOSIS — E669 Obesity, unspecified: Secondary | ICD-10-CM | POA: Insufficient documentation

## 2016-06-11 DIAGNOSIS — I10 Essential (primary) hypertension: Secondary | ICD-10-CM | POA: Insufficient documentation

## 2016-06-11 DIAGNOSIS — Z85850 Personal history of malignant neoplasm of thyroid: Secondary | ICD-10-CM | POA: Insufficient documentation

## 2016-06-11 DIAGNOSIS — M5489 Other dorsalgia: Secondary | ICD-10-CM | POA: Insufficient documentation

## 2016-06-11 DIAGNOSIS — I2510 Atherosclerotic heart disease of native coronary artery without angina pectoris: Secondary | ICD-10-CM | POA: Insufficient documentation

## 2016-06-11 DIAGNOSIS — F329 Major depressive disorder, single episode, unspecified: Secondary | ICD-10-CM | POA: Insufficient documentation

## 2016-06-11 DIAGNOSIS — F419 Anxiety disorder, unspecified: Secondary | ICD-10-CM | POA: Diagnosis not present

## 2016-06-11 DIAGNOSIS — G2581 Restless legs syndrome: Secondary | ICD-10-CM | POA: Diagnosis not present

## 2016-06-11 DIAGNOSIS — I959 Hypotension, unspecified: Secondary | ICD-10-CM

## 2016-06-11 DIAGNOSIS — Z90710 Acquired absence of both cervix and uterus: Secondary | ICD-10-CM | POA: Diagnosis not present

## 2016-06-11 HISTORY — DX: Gastro-esophageal reflux disease without esophagitis: K21.9

## 2016-06-11 HISTORY — PX: INSERTION OF MESH: SHX5868

## 2016-06-11 HISTORY — DX: Unspecified glaucoma: H40.9

## 2016-06-11 HISTORY — DX: Atherosclerotic heart disease of native coronary artery without angina pectoris: I25.10

## 2016-06-11 HISTORY — PX: LAPAROSCOPIC INCISIONAL / UMBILICAL / VENTRAL HERNIA REPAIR: SUR789

## 2016-06-11 HISTORY — DX: Carpal tunnel syndrome, unspecified upper limb: G56.00

## 2016-06-11 HISTORY — DX: Restless legs syndrome: G25.81

## 2016-06-11 HISTORY — PX: UMBILICAL HERNIA REPAIR: SHX196

## 2016-06-11 HISTORY — DX: Unspecified convulsions: R56.9

## 2016-06-11 LAB — BASIC METABOLIC PANEL
Anion gap: 8 (ref 5–15)
BUN: 22 mg/dL — AB (ref 6–20)
CHLORIDE: 107 mmol/L (ref 101–111)
CO2: 26 mmol/L (ref 22–32)
Calcium: 9.1 mg/dL (ref 8.9–10.3)
Creatinine, Ser: 1.11 mg/dL — ABNORMAL HIGH (ref 0.44–1.00)
GFR calc Af Amer: 53 mL/min — ABNORMAL LOW (ref >60)
GFR calc non Af Amer: 45 mL/min — ABNORMAL LOW (ref >60)
GLUCOSE: 129 mg/dL — AB (ref 65–99)
POTASSIUM: 4.1 mmol/L (ref 3.5–5.1)
Sodium: 141 mmol/L (ref 135–145)

## 2016-06-11 LAB — CBC
HCT: 40.4 % (ref 36.0–46.0)
HEMOGLOBIN: 13.3 g/dL (ref 12.0–15.0)
MCH: 30.8 pg (ref 26.0–34.0)
MCHC: 32.9 g/dL (ref 30.0–36.0)
MCV: 93.5 fL (ref 78.0–100.0)
Platelets: 284 10*3/uL (ref 150–400)
RBC: 4.32 MIL/uL (ref 3.87–5.11)
RDW: 12.9 % (ref 11.5–15.5)
WBC: 6 10*3/uL (ref 4.0–10.5)

## 2016-06-11 LAB — GLUCOSE, CAPILLARY: Glucose-Capillary: 133 mg/dL — ABNORMAL HIGH (ref 65–99)

## 2016-06-11 SURGERY — REPAIR, HERNIA, UMBILICAL, LAPAROSCOPIC
Anesthesia: General | Site: Abdomen

## 2016-06-11 MED ORDER — GLYCOPYRROLATE 0.2 MG/ML IV SOSY
PREFILLED_SYRINGE | INTRAVENOUS | Status: AC
  Filled 2016-06-11: qty 3

## 2016-06-11 MED ORDER — LEVOTHYROXINE SODIUM 100 MCG PO TABS
100.0000 ug | ORAL_TABLET | Freq: Once | ORAL | Status: AC
  Administered 2016-06-11: 100 ug via ORAL
  Filled 2016-06-11: qty 1

## 2016-06-11 MED ORDER — LACTATED RINGERS IV SOLN
INTRAVENOUS | Status: DC
  Administered 2016-06-11 (×2): via INTRAVENOUS

## 2016-06-11 MED ORDER — ACETAMINOPHEN 10 MG/ML IV SOLN
INTRAVENOUS | Status: AC
  Filled 2016-06-11: qty 100

## 2016-06-11 MED ORDER — 0.9 % SODIUM CHLORIDE (POUR BTL) OPTIME
TOPICAL | Status: DC | PRN
  Administered 2016-06-11: 1000 mL

## 2016-06-11 MED ORDER — LATANOPROST 0.005 % OP SOLN
1.0000 [drp] | Freq: Every day | OPHTHALMIC | Status: DC
  Administered 2016-06-11 – 2016-06-12 (×2): 1 [drp] via OPHTHALMIC
  Filled 2016-06-11: qty 2.5

## 2016-06-11 MED ORDER — BUPIVACAINE-EPINEPHRINE 0.25% -1:200000 IJ SOLN
INTRAMUSCULAR | Status: DC | PRN
Start: 2016-06-11 — End: 2016-06-11
  Administered 2016-06-11: 4 mL

## 2016-06-11 MED ORDER — CHLORHEXIDINE GLUCONATE CLOTH 2 % EX PADS: 6.0000 | MEDICATED_PAD | Freq: Once | CUTANEOUS | Status: DC

## 2016-06-11 MED ORDER — PROPOFOL 10 MG/ML IV BOLUS
INTRAVENOUS | Status: DC | PRN
  Administered 2016-06-11: 40 mg via INTRAVENOUS
  Administered 2016-06-11: 100 mg via INTRAVENOUS

## 2016-06-11 MED ORDER — ATORVASTATIN CALCIUM 20 MG PO TABS
20.0000 mg | ORAL_TABLET | Freq: Every day | ORAL | Status: DC
  Administered 2016-06-11 – 2016-06-12 (×2): 20 mg via ORAL
  Filled 2016-06-11 (×2): qty 1

## 2016-06-11 MED ORDER — LIDOCAINE 2% (20 MG/ML) 5 ML SYRINGE
INTRAMUSCULAR | Status: AC
  Filled 2016-06-11: qty 5

## 2016-06-11 MED ORDER — FENTANYL CITRATE (PF) 100 MCG/2ML IJ SOLN
INTRAMUSCULAR | Status: AC
  Filled 2016-06-11: qty 2

## 2016-06-11 MED ORDER — GLYCOPYRROLATE 0.2 MG/ML IV SOSY
PREFILLED_SYRINGE | INTRAVENOUS | Status: DC | PRN
  Administered 2016-06-11: .2 mg via INTRAVENOUS
  Administered 2016-06-11: .3 mg via INTRAVENOUS
  Administered 2016-06-11: 0.4 mg via INTRAVENOUS

## 2016-06-11 MED ORDER — BUPROPION HCL ER (SR) 100 MG PO TB12
200.0000 mg | ORAL_TABLET | Freq: Every day | ORAL | Status: DC
  Administered 2016-06-11 – 2016-06-13 (×3): 200 mg via ORAL
  Filled 2016-06-11 (×3): qty 2

## 2016-06-11 MED ORDER — FENTANYL CITRATE (PF) 100 MCG/2ML IJ SOLN
INTRAMUSCULAR | Status: DC | PRN
  Administered 2016-06-11 (×2): 50 ug via INTRAVENOUS

## 2016-06-11 MED ORDER — LEVOTHYROXINE SODIUM 100 MCG PO TABS
100.0000 ug | ORAL_TABLET | ORAL | Status: DC
  Administered 2016-06-13: 100 ug via ORAL
  Filled 2016-06-11 (×2): qty 1

## 2016-06-11 MED ORDER — ONDANSETRON HCL 4 MG/2ML IJ SOLN
INTRAMUSCULAR | Status: AC
  Filled 2016-06-11: qty 2

## 2016-06-11 MED ORDER — LEVOTHYROXINE SODIUM 75 MCG PO TABS
75.0000 ug | ORAL_TABLET | ORAL | Status: DC
  Administered 2016-06-12: 75 ug via ORAL
  Filled 2016-06-11: qty 1

## 2016-06-11 MED ORDER — LEVOTHYROXINE SODIUM 75 MCG PO TABS: 75.0000 ug | ORAL_TABLET | ORAL | Status: DC

## 2016-06-11 MED ORDER — ONDANSETRON HCL 4 MG/2ML IJ SOLN: 4.0000 mg | Freq: Four times a day (QID) | INTRAMUSCULAR | Status: DC | PRN

## 2016-06-11 MED ORDER — MORPHINE SULFATE (PF) 2 MG/ML IV SOLN: 2.0000 mg | INTRAVENOUS | Status: DC | PRN

## 2016-06-11 MED ORDER — METOPROLOL SUCCINATE ER 50 MG PO TB24
50.0000 mg | ORAL_TABLET | Freq: Every day | ORAL | Status: DC
  Administered 2016-06-11: 50 mg via ORAL
  Filled 2016-06-11: qty 1

## 2016-06-11 MED ORDER — LIDOCAINE 2% (20 MG/ML) 5 ML SYRINGE
INTRAMUSCULAR | Status: DC | PRN
  Administered 2016-06-11: 80 mg via INTRAVENOUS

## 2016-06-11 MED ORDER — ONDANSETRON HCL 4 MG/2ML IJ SOLN: 4.0000 mg | Freq: Once | INTRAMUSCULAR | Status: DC | PRN

## 2016-06-11 MED ORDER — ACETAMINOPHEN 10 MG/ML IV SOLN
INTRAVENOUS | Status: DC | PRN
  Administered 2016-06-11: 1000 mg via INTRAVENOUS

## 2016-06-11 MED ORDER — NEOSTIGMINE METHYLSULFATE 5 MG/5ML IV SOSY
PREFILLED_SYRINGE | INTRAVENOUS | Status: AC
  Filled 2016-06-11: qty 5

## 2016-06-11 MED ORDER — METOPROLOL SUCCINATE ER 50 MG PO TB24
50.0000 mg | ORAL_TABLET | Freq: Every day | ORAL | Status: DC
  Administered 2016-06-12 – 2016-06-13 (×2): 50 mg via ORAL
  Filled 2016-06-11 (×2): qty 1

## 2016-06-11 MED ORDER — ACETAMINOPHEN 325 MG PO TABS: 650.0000 mg | ORAL_TABLET | Freq: Four times a day (QID) | ORAL | Status: DC | PRN

## 2016-06-11 MED ORDER — ROPINIROLE HCL 0.5 MG PO TABS
0.5000 mg | ORAL_TABLET | Freq: Every day | ORAL | Status: DC
  Administered 2016-06-11 – 2016-06-12 (×2): 0.5 mg via ORAL
  Filled 2016-06-11 (×2): qty 1

## 2016-06-11 MED ORDER — SODIUM CHLORIDE 0.9 % IV SOLN
INTRAVENOUS | Status: DC
  Administered 2016-06-11: 17:00:00 via INTRAVENOUS
  Administered 2016-06-12: 1 mL via INTRAVENOUS

## 2016-06-11 MED ORDER — FENTANYL CITRATE (PF) 100 MCG/2ML IJ SOLN
25.0000 ug | INTRAMUSCULAR | Status: DC | PRN
  Administered 2016-06-11: 50 ug via INTRAVENOUS
  Administered 2016-06-11: 25 ug via INTRAVENOUS

## 2016-06-11 MED ORDER — BUPIVACAINE-EPINEPHRINE (PF) 0.25% -1:200000 IJ SOLN
INTRAMUSCULAR | Status: AC
  Filled 2016-06-11: qty 30

## 2016-06-11 MED ORDER — ENOXAPARIN SODIUM 40 MG/0.4ML ~~LOC~~ SOLN: 40.0000 mg | SUBCUTANEOUS | Status: DC

## 2016-06-11 MED ORDER — ONDANSETRON 4 MG PO TBDP: 4.0000 mg | ORAL_TABLET | Freq: Four times a day (QID) | ORAL | Status: DC | PRN

## 2016-06-11 MED ORDER — DULOXETINE HCL 60 MG PO CPEP
60.0000 mg | ORAL_CAPSULE | Freq: Two times a day (BID) | ORAL | Status: DC
  Administered 2016-06-11 – 2016-06-13 (×5): 60 mg via ORAL
  Filled 2016-06-11 (×5): qty 1

## 2016-06-11 MED ORDER — PANTOPRAZOLE SODIUM 40 MG PO TBEC
40.0000 mg | DELAYED_RELEASE_TABLET | Freq: Every day | ORAL | Status: DC
  Administered 2016-06-11 – 2016-06-13 (×3): 40 mg via ORAL
  Filled 2016-06-11 (×3): qty 1

## 2016-06-11 MED ORDER — METHOCARBAMOL 500 MG PO TABS
500.0000 mg | ORAL_TABLET | Freq: Four times a day (QID) | ORAL | Status: DC | PRN
  Administered 2016-06-11 – 2016-06-13 (×2): 500 mg via ORAL
  Filled 2016-06-11 (×2): qty 1

## 2016-06-11 MED ORDER — OXYCODONE HCL 5 MG PO TABS
5.0000 mg | ORAL_TABLET | ORAL | Status: DC | PRN
  Administered 2016-06-12 – 2016-06-13 (×2): 10 mg via ORAL
  Filled 2016-06-11 (×2): qty 2

## 2016-06-11 MED ORDER — ACETAMINOPHEN 650 MG RE SUPP: 650.0000 mg | Freq: Four times a day (QID) | RECTAL | Status: DC | PRN

## 2016-06-11 MED ORDER — FENTANYL CITRATE (PF) 100 MCG/2ML IJ SOLN
INTRAMUSCULAR | Status: AC
  Administered 2016-06-11: 25 ug via INTRAVENOUS
  Filled 2016-06-11: qty 2

## 2016-06-11 MED ORDER — CLONAZEPAM 0.5 MG PO TABS
0.5000 mg | ORAL_TABLET | Freq: Every evening | ORAL | Status: DC | PRN
  Administered 2016-06-11 – 2016-06-13 (×2): 0.5 mg via ORAL
  Filled 2016-06-11 (×2): qty 1

## 2016-06-11 MED ORDER — EPHEDRINE SULFATE 50 MG/ML IJ SOLN
INTRAMUSCULAR | Status: DC | PRN
  Administered 2016-06-11: 15 mg via INTRAVENOUS
  Administered 2016-06-11 (×2): 10 mg via INTRAVENOUS

## 2016-06-11 MED ORDER — FAMOTIDINE 20 MG PO TABS
20.0000 mg | ORAL_TABLET | Freq: Every day | ORAL | Status: DC
  Administered 2016-06-11 – 2016-06-12 (×2): 20 mg via ORAL
  Filled 2016-06-11 (×2): qty 1

## 2016-06-11 MED ORDER — ONDANSETRON HCL 4 MG/2ML IJ SOLN
INTRAMUSCULAR | Status: DC | PRN
  Administered 2016-06-11: 4 mg via INTRAVENOUS

## 2016-06-11 MED ORDER — LISINOPRIL 20 MG PO TABS
20.0000 mg | ORAL_TABLET | Freq: Every day | ORAL | Status: DC
  Administered 2016-06-11 – 2016-06-13 (×3): 20 mg via ORAL
  Filled 2016-06-11 (×3): qty 1

## 2016-06-11 MED ORDER — AMLODIPINE BESYLATE 5 MG PO TABS
5.0000 mg | ORAL_TABLET | Freq: Every day | ORAL | Status: DC
  Administered 2016-06-11 – 2016-06-12 (×2): 5 mg via ORAL
  Filled 2016-06-11 (×2): qty 1

## 2016-06-11 MED ORDER — PROPOFOL 10 MG/ML IV BOLUS
INTRAVENOUS | Status: AC
  Filled 2016-06-11: qty 20

## 2016-06-11 MED ORDER — ROCURONIUM BROMIDE 10 MG/ML (PF) SYRINGE
PREFILLED_SYRINGE | INTRAVENOUS | Status: DC | PRN
  Administered 2016-06-11: 40 mg via INTRAVENOUS

## 2016-06-11 MED ORDER — NEOSTIGMINE METHYLSULFATE 5 MG/5ML IV SOSY
PREFILLED_SYRINGE | INTRAVENOUS | Status: DC | PRN
  Administered 2016-06-11: 1.5 mg via INTRAVENOUS
  Administered 2016-06-11: .5 mg via INTRAVENOUS
  Administered 2016-06-11: 3 mg via INTRAVENOUS

## 2016-06-11 MED ORDER — SIMETHICONE 80 MG PO CHEW: 40.0000 mg | CHEWABLE_TABLET | Freq: Four times a day (QID) | ORAL | Status: DC | PRN

## 2016-06-11 MED ORDER — ROCURONIUM BROMIDE 10 MG/ML (PF) SYRINGE
PREFILLED_SYRINGE | INTRAVENOUS | Status: AC
  Filled 2016-06-11: qty 10

## 2016-06-11 SURGICAL SUPPLY — 47 items
APPLIER CLIP 5 13 M/L LIGAMAX5 (MISCELLANEOUS)
APR CLP MED LRG 5 ANG JAW (MISCELLANEOUS)
CANISTER SUCTION 2500CC (MISCELLANEOUS) ×2 IMPLANT
CHLORAPREP W/TINT 26ML (MISCELLANEOUS) ×3 IMPLANT
CLIP APPLIE 5 13 M/L LIGAMAX5 (MISCELLANEOUS) IMPLANT
CLOSURE STERI-STRIP 1/2X4 (GAUZE/BANDAGES/DRESSINGS) ×1
CLSR STERI-STRIP ANTIMIC 1/2X4 (GAUZE/BANDAGES/DRESSINGS) ×1 IMPLANT
COVER SURGICAL LIGHT HANDLE (MISCELLANEOUS) ×3 IMPLANT
DEVICE RELIATACK FIXATION (MISCELLANEOUS) ×3 IMPLANT
DEVICE TROCAR PUNCTURE CLOSURE (ENDOMECHANICALS) ×3 IMPLANT
DRAPE INCISE IOBAN 66X45 STRL (DRAPES) ×3 IMPLANT
DRSG TEGADERM 2-3/8X2-3/4 SM (GAUZE/BANDAGES/DRESSINGS) ×2 IMPLANT
ELECT REM PT RETURN 9FT ADLT (ELECTROSURGICAL) ×3
ELECTRODE REM PT RTRN 9FT ADLT (ELECTROSURGICAL) ×1 IMPLANT
GAUZE SPONGE 2X2 8PLY STRL LF (GAUZE/BANDAGES/DRESSINGS) ×1 IMPLANT
GLOVE BIO SURGEON STRL SZ7 (GLOVE) ×3 IMPLANT
GLOVE BIOGEL PI IND STRL 7.5 (GLOVE) IMPLANT
GLOVE BIOGEL PI INDICATOR 7.5 (GLOVE) ×2
GOWN STRL REUS W/ TWL LRG LVL3 (GOWN DISPOSABLE) ×3 IMPLANT
GOWN STRL REUS W/TWL LRG LVL3 (GOWN DISPOSABLE) ×9
KIT BASIN OR (CUSTOM PROCEDURE TRAY) ×3 IMPLANT
KIT ROOM TURNOVER OR (KITS) ×3 IMPLANT
LIQUID BAND (GAUZE/BANDAGES/DRESSINGS) ×3 IMPLANT
MARKER SKIN DUAL TIP RULER LAB (MISCELLANEOUS) ×3 IMPLANT
MESH VENTRALIGHT ST 6IN CRC (Mesh General) ×2 IMPLANT
NDL SPNL 22GX3.5 QUINCKE BK (NEEDLE) ×1 IMPLANT
NEEDLE SPNL 22GX3.5 QUINCKE BK (NEEDLE) ×3 IMPLANT
NS IRRIG 1000ML POUR BTL (IV SOLUTION) ×3 IMPLANT
PAD ARMBOARD 7.5X6 YLW CONV (MISCELLANEOUS) ×3 IMPLANT
RELOAD ENDO RELIATCK 10 HERNIA (MISCELLANEOUS) IMPLANT
RELOAD ENDO RELIATCK 5 HERNIA (MISCELLANEOUS) IMPLANT
RELOAD RELIATACK 10 (MISCELLANEOUS) IMPLANT
RELOAD RELIATACK 5 (MISCELLANEOUS) ×12 IMPLANT
SCALPEL HARMONIC ACE (MISCELLANEOUS) IMPLANT
SCISSORS LAP 5X35 DISP (ENDOMECHANICALS) ×3 IMPLANT
SET IRRIG TUBING LAPAROSCOPIC (IRRIGATION / IRRIGATOR) IMPLANT
SLEEVE ENDOPATH XCEL 5M (ENDOMECHANICALS) ×6 IMPLANT
SPONGE GAUZE 2X2 STER 10/PKG (GAUZE/BANDAGES/DRESSINGS) ×2
SUT MNCRL AB 4-0 PS2 18 (SUTURE) ×3 IMPLANT
SUT PROLENE 0 CT 1 CR/8 (SUTURE) ×3 IMPLANT
TOWEL OR 17X24 6PK STRL BLUE (TOWEL DISPOSABLE) ×3 IMPLANT
TOWEL OR 17X26 10 PK STRL BLUE (TOWEL DISPOSABLE) ×3 IMPLANT
TRAY FOLEY CATH 14FRSI W/METER (CATHETERS) IMPLANT
TRAY LAPAROSCOPIC MC (CUSTOM PROCEDURE TRAY) ×3 IMPLANT
TROCAR XCEL NON-BLD 11X100MML (ENDOMECHANICALS) IMPLANT
TROCAR XCEL NON-BLD 5MMX100MML (ENDOMECHANICALS) ×3 IMPLANT
TUBING INSUFFLATION (TUBING) ×3 IMPLANT

## 2016-06-11 NOTE — Transfer of Care (Signed)
Immediate Anesthesia Transfer of Care Note  Patient: Wendy Roth  Procedure(s) Performed: Procedure(s): LAPAROSCOPIC UMBILICAL HERNIA (N/A) INSERTION OF MESH (N/A)  Patient Location: PACU  Anesthesia Type:General  Level of Consciousness: awake, alert  and oriented  Airway & Oxygen Therapy: Patient Spontanous Breathing and Patient connected to face mask oxygen  Post-op Assessment: Report given to RN and Post -op Vital signs reviewed and stable  Post vital signs: Reviewed and stable  Last Vitals:  Vitals:   06/11/16 0934  BP: 123/61  Pulse: 64  Resp: 20  Temp: 36.6 C    Last Pain:  Vitals:   06/11/16 0934  TempSrc: Oral     HR 73, BP 155/71, sats 96 on FM Patients Stated Pain Goal: 3 ([id number])  Complications: No apparent anesthesia complications

## 2016-06-11 NOTE — Interval H&P Note (Signed)
History and Physical Interval Note:  06/11/2016 10:58 AM  Wendy Roth  has presented today for surgery, with the diagnosis of umbilical hernia  The various methods of treatment have been discussed with the patient and family. After consideration of risks, benefits and other options for treatment, the patient has consented to  Procedure(s): LAPAROSCOPIC UMBILICAL HERNIA (N/A) INSERTION OF MESH (N/A) as a surgical intervention .  The patient's history has been reviewed, patient examined, no change in status, stable for surgery.  I have reviewed the patient's chart and labs.  Questions were answered to the patient's satisfaction.     [name]

## 2016-06-11 NOTE — Progress Notes (Signed)
Pt admitted to 6N27 via bed from PACU.  Pt AAO X 4.  Pt on 3L O2 via [place].  Pt has 18G Lt hand with fluids infusing.  Pt has incision at umbilicus with gauze and transparent dressing and lap sites X 7 with steri-strips.  Pt has SCDs in place.  Report rcvd from [place], [place].  Pt has no complaints at the moment.  Will continue to monitor.

## 2016-06-11 NOTE — Discharge Instructions (Signed)
[facility] LAPAROSCOPIC SURGERY: POST OP INSTRUCTIONS  Always review your discharge instruction sheet given to you by the facility where your surgery was performed. IF YOU HAVE DISABILITY OR FAMILY LEAVE FORMS, YOU MUST BRING THEM TO THE OFFICE FOR PROCESSING.   DO NOT GIVE THEM TO YOUR DOCTOR.  1. A prescription for pain medication may be given to you upon discharge.  Take your pain medication as prescribed, if needed.  If narcotic pain medicine is not needed, then you may take acetaminophen (Tylenol), naprosyn (Alleve), or ibuprofen (Advil) as needed. 2. Take your usually prescribed medications unless otherwise directed. 3. If you need a refill on your pain medication, please contact your pharmacy.  They will contact our office to request authorization. Prescriptions will not be filled after 5pm or on week-ends. 4. You should follow a light diet the first few days after arrival home, such as soup and crackers, etc.  Be sure to include lots of fluids daily. 5. Most patients will experience some swelling and bruising in the area of the incisions.  Ice packs will help.  Swelling and bruising can take several days to resolve.  6. It is common to experience some constipation if taking pain medication after surgery.  Increasing fluid intake and taking a stool softener (such as Colace) will usually help or prevent this problem from occurring.  A mild laxative (Milk of Magnesia or Miralax) should be taken according to package instructions if there are no bowel movements after 48 hours. 7. Unless discharge instructions indicate otherwise, you may remove your bandages 48 hours after surgery, and you may shower at that time.  You may have steri-strips (small skin tapes) in place directly over the incision.  These strips should be left on the skin for 7-10 days.  If your surgeon used skin glue on the incision, you may shower in 24 hours.  The glue will flake  off over the next 2-3 weeks.  Any sutures or staples will be removed at the office during your follow-up visit. 8. ACTIVITIES:  You may resume regular (light) daily activities beginning the next day--such as daily self-care, walking, climbing stairs--gradually increasing activities as tolerated.  You may have sexual intercourse when it is comfortable.  Refrain from any heavy lifting or straining until approved by your doctor. a. You may drive when you are no longer taking prescription pain medication, you can comfortably wear a seatbelt, and you can safely maneuver your car and apply brakes. b. RETURN TO WORK:  __________________________________________________________ 9. You should see your doctor in the office for a follow-up appointment approximately 2-3 weeks after your surgery.  Make sure that you call for this appointment within a day or two after you arrive home to insure a convenient appointment time. 10. OTHER INSTRUCTIONS: __________________________________________________________________________________________________________________________ __________________________________________________________________________________________________________________________ WHEN TO CALL YOUR DOCTOR: 1. Fever over 101.0 2. Inability to urinate 3. Continued bleeding from incision. 4. Increased pain, redness, or drainage from the incision. 5. Increasing abdominal pain  The clinic staff is available to answer your questions during regular business hours.  Please don't hesitate to call and ask to speak to one of the nurses for clinical concerns.  If you have a medical emergency, go to the nearest emergency room or call 911.  A surgeon from [facility] is always on call at the hospital. [address], [address], [place], [place]  [address] ? [address] [address], [place], [place]   [address] [phone] ? [phone] ? [phone]   [phone] Web site: [email or web address]  

## 2016-06-11 NOTE — Op Note (Signed)
Preoperative diagnosis: Umbilical hernia Postoperative diagnosis: Same as above Procedure: Laparoscopic umbilical hernia repair with 15 cm round Ventralight mesh Surgeon: Dr. Serita Grammes Anesthesia: Gen. Estimated blood loss: Minimal Complications: None Drains: None Specimens: None Sponge count correct at completion Disposition to recovery stable condition  Indications: This is an 82 year old female who is a breast cancer patient of mine who has a symptomatic umbilical hernia. We discussed a laparoscopic repair with mesh.  Procedure: After informed consent was obtained the patient was taken to the operating room. She was given antibiotics and sequential compression devices were in place. She was then placed under general anesthesia without complication. Her abdomen was prepped and draped in the standard sterile surgical fashion. A surgical timeout was then performed.  I then infiltrated Marcaine in the left upper quadrant and made anincision. I inserted a 5 mm Optiview trocar without any difficulty. I then insufflated the abdomen to 15 mmHg pressure. I inserted 2 further 5 mm trocars in the left abdomen. She was noted to have a hernia with a fair amount of omentum incarcerated. This was reduced. She also had some adhesions in the low midline from prior surgery that were taken down with a combination of blunt dissection and scissors. Once this was done the defect was noted to be fairly small. Due to her habitus I thought it would be better to fix this with mesh. I then placed a 15 cm ventralight mesh into the abdomen after placing 4 sutures and the cardinal positions. I then brought these up and tied them down through separate stab incisions. This covered the hernia with good overlap. I then used the overlying attack to tack the edges. This all appeared to be in good position. Hemostasis was observed. I then desufflated the abdomen and removed my trocars. These were all closed with 4-0 Monocryl and  glue. She tolerated this well was extubated and transferred to recovery stable.

## 2016-06-11 NOTE — H&P (Signed)
82 yof who I know from breast cancer treatment presents with three week old umbilical bulge. this popped out three weeks ago and has remained out. It causes some discomfort. she has no issues with bms, no n/v. it is not able to be reduced. she has only tah through low transverse incision.  Other Problems Davy Pique Bynum, CMA; 05/22/2016 9:03 AM) Anxiety Disorder Arthritis Back Pain Breast Cancer Cervical Cancer Depression Gastroesophageal Reflux Disease Hemorrhoids High blood pressure Hypercholesterolemia Kidney Stone Melanoma Oophorectomy Bilateral. Thyroid Cancer Thyroid Disease Umbilical Hernia Repair  Past Surgical History Marjean Donna, CMA; 05/22/2016 9:03 AM) Breast Biopsy Bilateral. multiple Breast Mass; Local Excision Bilateral. Cataract Surgery Bilateral. Hysterectomy (due to cancer) - Partial Oral Surgery Tonsillectomy  Diagnostic Studies History Marjean Donna, CMA; 05/22/2016 9:03 AM) Colonoscopy 1-5 years ago Mammogram within last year Pap Smear 1-5 years ago  Allergies Marjean Donna, CMA; 05/22/2016 9:04 AM) No Known Drug Allergies08/17/2017  Medication History (Sonya Bynum, CMA; 05/22/2016 9:05 AM) AmLODIPine Besylate (5MG  Tablet, Oral) Active. Latanoprost (0.005% Solution, Ophthalmic) Active. BuPROPion HCl ER (SR) (200MG  Tablet ER 12HR, Oral) Active. Atorvastatin Calcium (20MG  Tablet, Oral) Active. DULoxetine HCl (60MG  Capsule DR Part, Oral) Active. Omeprazole (40MG  Capsule DR, Oral) Active. Toprol XL (50MG  Tablet ER 24HR, Oral) Active. Levothyroxine Sodium (75MCG Tablet, Oral) Active. Famotidine (20MG  Tablet, Oral) Active. Lisinopril (20MG  Tablet, Oral) Active. Letrozole (2.5MG  Tablet, Oral) Active. Medications Reconciled  Social History Marjean Donna, CMA; 05/22/2016 9:03 AM) Alcohol use Occasional alcohol use. Caffeine use Coffee, Tea. No drug use Tobacco use Never smoker.  Family History Marjean Donna, [place];  05/22/2016 9:03 AM) Alcohol Abuse Mother. Breast Cancer Family Members In General. Depression Daughter, Father. Heart Disease Father. Heart disease in female family member before age 49 Heart disease in female family member before age 5  Pregnancy / Birth History Marjean Donna, [place]; 05/22/2016 9:03 AM) Age at menarche 82 years. Gravida 2 Maternal age 38-20 Para 2   Review of Systems ([place]; 05/22/2016 9:03 AM) General Present- Fatigue. Not Present- Appetite Loss, Chills, Fever, Night Sweats, Weight Gain and Weight Loss. Skin Present- Hives. Not Present- Change in Wart/Mole, Dryness, Jaundice, New Lesions, Non-Healing Wounds, Rash and Ulcer. HEENT Not Present- Earache, Hearing Loss, Hoarseness, Nose Bleed, Oral Ulcers, Ringing in the Ears, Seasonal Allergies, Sinus Pain, Sore Throat, Visual Disturbances, Wears glasses/contact lenses and Yellow Eyes. Breast Present- Breast Pain. Not Present- Breast Mass, Nipple Discharge and Skin Changes. Cardiovascular Not Present- Chest Pain, Difficulty Breathing Lying Down, Leg Cramps, Palpitations, Rapid Heart Rate, Shortness of Breath and Swelling of Extremities. Female Genitourinary Not Present- Frequency, Nocturia, Painful Urination, Pelvic Pain and Urgency. Musculoskeletal Present- Back Pain, Joint Pain, Muscle Pain and Muscle Weakness. Not Present- Joint Stiffness and Swelling of Extremities. Neurological Present- Decreased Memory and Tingling. Not Present- Fainting, Headaches, Numbness, Seizures, Tremor, Trouble walking and Weakness. Psychiatric Present- Anxiety, Change in Sleep Pattern and Depression. Not Present- Bipolar, Fearful and Frequent crying. Endocrine Present- New Diabetes. Not Present- Cold Intolerance, Excessive Hunger, Hair Changes, Heat Intolerance and Hot flashes. Hematology Not Present- Easy Bruising, Excessive bleeding, Gland problems, HIV and Persistent Infections.  Vitals (Sonya Bynum CMA; 05/22/2016 9:04  AM) 05/22/2016 9:04 AM Weight: 163 lb Temp.: 21F(Temporal)  Pulse: 75 (Regular)  BP: 126/74 (Sitting, Left Arm, Standard)   Physical Exam Rolm Bookbinder MD; 05/22/2016 9:36 AM) General Mental Status-Alert. Orientation-Oriented X3. Eye Sclera/Conjunctiva - Bilateral-No Discharge. Chest and Lung Exam Chest and lung exam reveals -on auscultation, normal breath sounds, no adventitious sounds and normal  vocal resonance. Cardiovascular Cardiovascular examination reveals -normal heart sounds, regular rate and rhythm with no murmurs. Abdomen Note: low transverse incision, mildly tender uh that is not able to be reduced, soft   Assessment & Plan Rolm Bookbinder MD; [phone] AM) UMBILICAL HERNIA ([id number]) Story: Laparoscopic umbilical hernia repair with mesh  I think this needs to be repaired due to symptoms. will plan lap repair with mesh. discussed risks/benefits/recovery

## 2016-06-11 NOTE — Anesthesia Postprocedure Evaluation (Signed)
Anesthesia Post Note  Patient: MARSHAWNA LIPPITT  Procedure(s) Performed: Procedure(s) (LRB): LAPAROSCOPIC UMBILICAL HERNIA (N/A) INSERTION OF MESH (N/A)  Patient location during evaluation: PACU Anesthesia Type: General Level of consciousness: awake and alert Pain management: pain level controlled Vital Signs Assessment: post-procedure vital signs reviewed and stable Respiratory status: spontaneous breathing, nonlabored ventilation, respiratory function stable and patient connected to nasal cannula oxygen Cardiovascular status: blood pressure returned to baseline and stable Postop Assessment: no signs of nausea or vomiting Anesthetic complications: no    Last Vitals:  Vitals:   06/11/16 1345 06/11/16 1400  BP: 115/64 113/71  Pulse: (!) 59 61  Resp: 15 12  Temp:      Last Pain:  Vitals:   06/11/16 1400  TempSrc:   PainSc: Asleep                 Nilda Simmer

## 2016-06-12 ENCOUNTER — Observation Stay (HOSPITAL_COMMUNITY): Payer: Medicare Other

## 2016-06-12 ENCOUNTER — Encounter (HOSPITAL_COMMUNITY): Payer: Self-pay | Admitting: General Surgery

## 2016-06-12 DIAGNOSIS — K420 Umbilical hernia with obstruction, without gangrene: Secondary | ICD-10-CM | POA: Diagnosis not present

## 2016-06-12 LAB — BASIC METABOLIC PANEL
ANION GAP: 6 (ref 5–15)
BUN: 18 mg/dL (ref 6–20)
CHLORIDE: 103 mmol/L (ref 101–111)
CO2: 30 mmol/L (ref 22–32)
Calcium: 8.8 mg/dL — ABNORMAL LOW (ref 8.9–10.3)
Creatinine, Ser: 1.33 mg/dL — ABNORMAL HIGH (ref 0.44–1.00)
GFR calc non Af Amer: 36 mL/min — ABNORMAL LOW (ref >60)
GFR, EST AFRICAN AMERICAN: 42 mL/min — AB (ref >60)
Glucose, Bld: 160 mg/dL — ABNORMAL HIGH (ref 65–99)
POTASSIUM: 4.5 mmol/L (ref 3.5–5.1)
SODIUM: 139 mmol/L (ref 135–145)

## 2016-06-12 LAB — CBC
HEMATOCRIT: 40.1 % (ref 36.0–46.0)
HEMOGLOBIN: 12.8 g/dL (ref 12.0–15.0)
MCH: 31.4 pg (ref 26.0–34.0)
MCHC: 31.9 g/dL (ref 30.0–36.0)
MCV: 98.5 fL (ref 78.0–100.0)
Platelets: 281 10*3/uL (ref 150–400)
RBC: 4.07 MIL/uL (ref 3.87–5.11)
RDW: 13.2 % (ref 11.5–15.5)
WBC: 7.1 10*3/uL (ref 4.0–10.5)

## 2016-06-12 LAB — HEMOGLOBIN A1C
Hgb A1c MFr Bld: 6.7 % — ABNORMAL HIGH (ref 4.8–5.6)
Mean Plasma Glucose: 146 mg/dL

## 2016-06-12 MED ORDER — OXYCODONE HCL 5 MG PO TABS: 5.0000 mg | ORAL_TABLET | ORAL | 0 refills | Status: DC | PRN

## 2016-06-12 MED ORDER — SODIUM CHLORIDE 0.9 % IV BOLUS (SEPSIS)
500.0000 mL | Freq: Once | INTRAVENOUS | Status: AC
  Administered 2016-06-12: 500 mL via INTRAVENOUS

## 2016-06-12 NOTE — Care Management Obs Status (Signed)
[place] NOTIFICATION   Patient Details  Name: DORIS GRAVOIS MRN: [id number] Date of Birth: [birth year]   Medicare Observation Status Notification Given:  Yes (Medicare procedure)    Marilu Favre, RN 06/12/2016, 8:39 AM

## 2016-06-12 NOTE — Progress Notes (Signed)
1 Day Post-Op  Subjective: Decreased o2 sats in bed this am, voiding, tol diet, pain controlled, no n/v  Objective: Vital signs in last 24 hours: Temp:  [97.6 F (36.4 C)-98.8 F (37.1 C)] 98.8 F (37.1 C) (09/07 0700) Pulse Rate:  [59-73] 63 (09/07 0700) Resp:  [12-20] 17 (09/07 0700) BP: (105-155)/(58-80) 109/60 (09/07 0700) SpO2:  [82 %-96 %] 82 % (09/07 0700) Weight:  [72.6 kg (160 lb)] 72.6 kg (160 lb) (09/06 0934) Last BM Date: 06/10/16  Intake/Output from previous day: 09/06 0701 - 09/07 0700 In: 1974.2 [P.O.:600; I.V.:1374.2] Out: [id number] [Urine:9160; Blood:25] Intake/Output this shift: No intake/output data recorded.  General appearance: no distress Resp: clear to auscultation bilaterally Cardio: regular rate and rhythm GI: some bs present, incisions clean approp tender  Lab Results:   Recent Labs  06/11/16 0921  WBC 6.0  HGB 13.3  HCT 40.4  PLT 284   BMET  Recent Labs  06/11/16 0921  NA 141  K 4.1  CL 107  CO2 26  GLUCOSE 129*  BUN 22*  CREATININE 1.11*  CALCIUM 9.1   PT/INR No results for input(s): LABPROT, INR in the last 72 hours. ABG No results for input(s): PHART, HCO3 in the last 72 hours.  Invalid input(s): PCO2, PO2  Studies/Results: No results found.  Anti-infectives: Anti-infectives    Start     Dose/Rate Route Frequency Ordered Stop   06/11/16 1100  ceFAZolin (ANCEF) IVPB 2g/100 mL premix     2 g 200 mL/hr over 30 Minutes Intravenous To [facility] 06/10/16 1040 06/11/16 1105      Assessment/Plan: POD 1 lap umbo hernia repair  Po pain meds Regular diet pulm toilet today Pt eval and case mgt to see if going to facility possible  [facility] 06/12/2016

## 2016-06-13 DIAGNOSIS — K420 Umbilical hernia with obstruction, without gangrene: Secondary | ICD-10-CM | POA: Diagnosis not present

## 2016-06-13 NOTE — Progress Notes (Signed)
Patient ID: Wendy Roth, female   DOB: [birth year], 82 y.o.   MRN: [id number]   Other than being sore, she feels much better No SOB Wants to go home  Lungs clear Abdomen soft  Plan: Discharge home

## 2016-06-13 NOTE — Evaluation (Signed)
Physical Therapy Evaluation Patient Details Name: Wendy Roth MRN: [id number] DOB: 1932/03/30 Today's Date: 06/13/2016   History of Present Illness  Pt is an 82 y/o female s/p umbilical hernia repair. PMH including but not limited to breast cancer, cervical cancer and HTN.  Clinical Impression  Pt presented sitting OOB in recliner when PT entered room. Pt's husband was present throughout session. Prior to admission, pt stated that she was independent with functional movement and ADL's but required assistance with IADL's. Pt was slightly unsteady during ambulation; however, refused recommendation for use of a cane or walking aide. Pt had no LOB during evaluation; however, she tends to drift R and L throughout and reached for handrail in hallways occasionally. Pt planning to d/c back to her assisted living residence with her husband today. Pt with no acute PT needs at this time.     Follow Up Recommendations Supervision - Intermittent    Equipment Recommendations  None recommended by PT (suggested SPC; however, pt refused at this time)   Recommendations for Other Services       Precautions / Restrictions Precautions Precautions: Fall Restrictions Weight Bearing Restrictions: No      Mobility  Bed Mobility               General bed mobility comments: pt sitting OOB in recliner when PT entered room  Transfers Overall transfer level: Needs assistance Equipment used: None Transfers: Sit to/from Stand Sit to Stand: Modified independent (Device/Increase time)            Ambulation/Gait Ambulation/Gait assistance: Supervision Ambulation Distance (Feet): 300 Feet Assistive device: None Gait Pattern/deviations: Step-through pattern;Drifts right/left;Narrow base of support Gait velocity: decreased   General Gait Details: pt with no LOB; however, drifts L and R, and occasionally reaching for hand rail in hallway to steady herself.  Stairs             Wheelchair Mobility    Modified Rankin (Stroke Patients Only)       Balance Overall balance assessment: Needs assistance Sitting-balance support: Feet supported;No upper extremity supported Sitting balance-Leahy Scale: Fair     Standing balance support: During functional activity;No upper extremity supported Standing balance-Leahy Scale: Fair                   Standardized Balance Assessment Standardized Balance Assessment : Dynamic Gait Index   Dynamic Gait Index Level Surface: Mild Impairment Change in Gait Speed: Mild Impairment Gait with Horizontal Head Turns: Mild Impairment Gait with Vertical Head Turns: Mild Impairment Gait and Pivot Turn: Normal Step Over Obstacle: Normal Step Around Obstacles: Normal Steps: Mild Impairment Total Score: 19       Pertinent Vitals/Pain Pain Assessment: Faces Faces Pain Scale: Hurts a little bit Pain Location: abdomen Pain Descriptors / Indicators: Guarding Pain Intervention(s): Monitored during session    Home Living Family/patient expects to be discharged to:: Other (Comment) ([facility]) Living Arrangements: Spouse/significant other                    Prior Function Level of Independence: Needs assistance   Gait / Transfers Assistance Needed: independent  ADL's / Homemaking Assistance Needed: pt gets assistance with cooking and cleaning.        Hand Dominance        Extremity/Trunk Assessment   Upper Extremity Assessment: Overall WFL for tasks assessed           Lower Extremity Assessment: RLE deficits/detail;LLE deficits/detail RLE  Deficits / Details: Pt with 4/5 MMT for hip flexion, hip abduction, hip adduction, knee flexion; 5/5 for knee extension and ankle DF. LLE Deficits / Details: Pt with 4/5 MMT for hip flexion, hip abduction, hip adduction, knee flexion; 5/5 for knee extension and ankle DF.     Communication   Communication: No difficulties   Cognition Arousal/Alertness: Awake/alert Behavior During Therapy: WFL for tasks assessed/performed Overall Cognitive Status: Within Functional Limits for tasks assessed                      General Comments      Exercises        Assessment/Plan    PT Assessment Patent does not need any further PT services  PT Diagnosis Generalized weakness   PT Problem List    PT Treatment Interventions     PT Goals (Current goals can be found in the Care Plan section) Acute Rehab PT Goals Patient Stated Goal: return home PT Goal Formulation: With patient/family    Frequency     Barriers to discharge        Co-evaluation               End of Session   Activity Tolerance: Patient tolerated treatment well Patient left: in chair;with call bell/phone within reach;with family/visitor present Nurse Communication: Mobility status    Functional Assessment Tool Used: clinical judgement Functional Limitation: Mobility: Walking and moving around Mobility: Walking and Moving Around Current Status [id number]): At least 1 percent but less than 20 percent impaired, limited or restricted Mobility: Walking and Moving Around Goal Status [phone]): 0 percent impaired, limited or restricted    Time: [id number] PT Time Calculation (min) (ACUTE ONLY): 17 min   Charges:   PT Evaluation $PT Eval Moderate Complexity: 1 Procedure     PT G Codes:   PT G-Codes **NOT FOR INPATIENT CLASS** Functional Assessment Tool Used: clinical judgement Functional Limitation: Mobility: Walking and moving around Mobility: Walking and Moving Around Current Status [id number]): At least 1 percent but less than 20 percent impaired, limited or restricted Mobility: Walking and Moving Around Goal Status [phone]): 0 percent impaired, limited or restricted    [facility] 06/13/2016, 10:27 AM Sherie Don, PT, DPT [phone]

## 2016-06-13 NOTE — Discharge Summary (Signed)
Physician Discharge Summary  Patient ID: Wendy Roth MRN: [id number] DOB/AGE: 80-Jun-1933 80 y.o.  Admit date: 06/11/2016 Discharge date: 06/13/2016  Admission Diagnoses:  Discharge Diagnoses:  Active Problems:   Umbilical hernia   Discharged Condition: good  Hospital Course: uneventful post op recovery.  Discharged home pod#2 doing well  Consults: None  Significant Diagnostic Studies:   Treatments: surgery: laparoscopic incisional hernia repair with mesh  Discharge Exam: Blood pressure (!) 112/58, pulse 74, temperature 98.4 F (36.9 C), resp. rate 16, height 4\' 11"  (1.499 m), weight 72.6 kg (160 lb), SpO2 93 %. General appearance: alert, cooperative and no distress Resp: clear to auscultation bilaterally Cardio: regular rate and rhythm, S1, S2 normal, no murmur, click, rub or gallop Incision/Wound: abdomen soft, incisions clean  Disposition: 01-Home or Self Care     Medication List    TAKE these medications   amLODipine 5 MG tablet Commonly known as:  NORVASC Take 5 mg by mouth at bedtime.   aspirin 81 MG tablet Take 81 mg by mouth daily.   atorvastatin 20 MG tablet Commonly known as:  LIPITOR Take 20 mg by mouth at bedtime.   CALCIUM-MAGNESIUM-VITAMIN D PO Take 1 tablet by mouth 2 (two) times daily.   clonazePAM 0.5 MG tablet Commonly known as:  KLONOPIN Take 0.5 mg by mouth at bedtime as needed for anxiety.   DULoxetine 60 MG capsule Commonly known as:  CYMBALTA Take 60 mg by mouth 2 (two) times daily.   famotidine 20 MG tablet Commonly known as:  PEPCID Take 20 mg by mouth at bedtime.   latanoprost 0.005 % ophthalmic solution Commonly known as:  XALATAN Place 1 drop into both eyes at bedtime.   letrozole 2.5 MG tablet Commonly known as:  FEMARA TAKE 1 TABLET DAILY   levothyroxine 25 MCG tablet Commonly known as:  SYNTHROID, LEVOTHROID Take 75-100 mcg by mouth See admin instructions. Mon Wed Fri 100 mcg, and Sun Tues Thurs Sat 75 mcg    lisinopril 20 MG tablet Commonly known as:  PRINIVIL,ZESTRIL Take 20 mg by mouth daily.   metoprolol succinate 50 MG 24 hr tablet Commonly known as:  TOPROL-XL Take 50 mg by mouth daily. Take with or immediately following a meal.   omeprazole 40 MG capsule Commonly known as:  PRILOSEC Take 40 mg by mouth daily.   oxyCODONE 5 MG immediate release tablet Commonly known as:  Oxy IR/ROXICODONE Take 1-2 tablets (5-10 mg total) by mouth every 4 (four) hours as needed for moderate pain.   rOPINIRole 0.25 MG tablet Commonly known as:  REQUIP Take 0.5 mg by mouth at bedtime.   WELLBUTRIN SR 200 MG 12 hr tablet Generic drug:  buPROPion Take 200 mg by mouth daily.      Follow-up Information    WAKEFIELD,MATTHEW, MD Follow up in 3 week(s).   Specialty:  General Surgery Contact information: [address] [place] [address] [phone]           Signed: Harl Bowie 06/13/2016, 8:17 AM

## 2016-06-17 ENCOUNTER — Other Ambulatory Visit: Payer: Self-pay | Admitting: Hematology and Oncology

## 2016-07-24 ENCOUNTER — Encounter: Payer: Self-pay | Admitting: Hematology and Oncology

## 2016-07-24 ENCOUNTER — Ambulatory Visit (HOSPITAL_BASED_OUTPATIENT_CLINIC_OR_DEPARTMENT_OTHER): Payer: Medicare Other | Admitting: Hematology and Oncology

## 2016-07-24 ENCOUNTER — Ambulatory Visit: Payer: Medicare Other | Admitting: Hematology and Oncology

## 2016-07-24 DIAGNOSIS — Z170 Estrogen receptor positive status [ER+]: Secondary | ICD-10-CM | POA: Diagnosis not present

## 2016-07-24 DIAGNOSIS — Z853 Personal history of malignant neoplasm of breast: Secondary | ICD-10-CM | POA: Diagnosis present

## 2016-07-24 DIAGNOSIS — C50312 Malignant neoplasm of lower-inner quadrant of left female breast: Secondary | ICD-10-CM

## 2016-07-24 NOTE — Progress Notes (Signed)
Patient Care Team: Linus Mako, NP as PCP - General (Nurse Practitioner)  DIAGNOSIS:  Encounter Diagnosis  Name Primary?  . Malignant neoplasm of lower-inner quadrant of left breast in female, estrogen receptor positive ([place])     SUMMARY OF ONCOLOGIC HISTORY:   Cancer of lower-inner quadrant of left female breast ([place])   10/12/2012 Initial Diagnosis    Right breast biopsy: Invasive ductal carcinoma with DCIS ER 100%, PR 100%, Ki-67 15%, HER-2 negative ratio 1.13      10/13/2012 - 07/18/2013 Anti-estrogen oral therapy    Neoadjuvant Antiestrogen therapy with Femara 2.5 mg daily      07/05/2013 Surgery    Right breast lumpectomy: Invasive ductal carcinoma grade 1-2.2 cm with low-grade DCIS, the excision margins negative      08/17/2013 -  Anti-estrogen oral therapy    Letrozole 2.5 mg daily for 5 years       CHIEF COMPLIANT: Follow-up on letrozole therapy  INTERVAL HISTORY: Wendy Roth is a 82 year old with above-mentioned history of left breast cancer currently on adjuvant antiestrogen therapy with letrozole. She is tolerating it extremely well. She denies any hot flashes or myalgias. She will ask any lumps or nodules in the breasts.  REVIEW OF SYSTEMS:   Constitutional: Denies fevers, chills or abnormal weight loss Eyes: Denies blurriness of vision Ears, nose, mouth, throat, and face: Denies mucositis or sore throat Respiratory: Denies cough, dyspnea or wheezes Cardiovascular: Denies palpitation, chest discomfort Gastrointestinal:  Denies nausea, heartburn or change in bowel habits Skin: Denies abnormal skin rashes Lymphatics: Denies new lymphadenopathy or easy bruising Neurological:Denies numbness, tingling or new weaknesses Behavioral/Psych: Mood is stable, no new changes  Extremities: No lower extremity edema Breast:  denies any pain or lumps or nodules in either breasts All other systems were reviewed with the patient and are negative.  I have  reviewed the past medical history, past surgical history, social history and family history with the patient and they are unchanged from previous note.  ALLERGIES:  is allergic to no known allergies.  MEDICATIONS:  Current Outpatient Prescriptions  Medication Sig Dispense Refill  . amLODipine (NORVASC) 5 MG tablet Take 5 mg by mouth at bedtime.     Marland Kitchen aspirin 81 MG tablet Take 81 mg by mouth daily.    Marland Kitchen atorvastatin (LIPITOR) 20 MG tablet Take 20 mg by mouth at bedtime.    Marland Kitchen buPROPion (WELLBUTRIN SR) 200 MG 12 hr tablet Take 200 mg by mouth daily.    Marland Kitchen CALCIUM-MAGNESIUM-VITAMIN D PO Take 1 tablet by mouth 2 (two) times daily.    . clonazePAM (KLONOPIN) 0.5 MG tablet Take 0.5 mg by mouth at bedtime as needed for anxiety.    . DULoxetine (CYMBALTA) 60 MG capsule Take 60 mg by mouth 2 (two) times daily.    . famotidine (PEPCID) 20 MG tablet Take 20 mg by mouth at bedtime.     Marland Kitchen latanoprost (XALATAN) 0.005 % ophthalmic solution Place 1 drop into both eyes at bedtime.    Marland Kitchen letrozole (FEMARA) 2.5 MG tablet TAKE 1 TABLET DAILY 90 tablet 3  . levothyroxine (SYNTHROID, LEVOTHROID) 25 MCG tablet Take 75-100 mcg by mouth See admin instructions. Mon Wed Fri 100 mcg, and Sun Tues Thurs Sat 75 mcg    . lisinopril (PRINIVIL,ZESTRIL) 20 MG tablet Take 20 mg by mouth daily.    . metoprolol succinate (TOPROL-XL) 50 MG 24 hr tablet Take 50 mg by mouth daily. Take with or immediately following a meal.    .  omeprazole (PRILOSEC) 40 MG capsule Take 40 mg by mouth daily.    Marland Kitchen oxyCODONE (OXY IR/ROXICODONE) 5 MG immediate release tablet Take 1-2 tablets (5-10 mg total) by mouth every 4 (four) hours as needed for moderate pain. 30 tablet 0  . rOPINIRole (REQUIP) 0.25 MG tablet Take 0.5 mg by mouth at bedtime.     No current facility-administered medications for this visit.     PHYSICAL EXAMINATION: ECOG PERFORMANCE STATUS: 0 - Asymptomatic  Vitals:   07/24/16 1413  BP: 130/61  Pulse: 93  Resp: 18  Temp: 98.5  F (36.9 C)   Filed Weights   07/24/16 1413  Weight: 163 lb 6.4 oz (74.1 kg)    GENERAL:alert, no distress and comfortable SKIN: skin color, texture, turgor are normal, no rashes or significant lesions EYES: normal, Conjunctiva are pink and non-injected, sclera clear OROPHARYNX:no exudate, no erythema and lips, buccal mucosa, and tongue normal  NECK: supple, thyroid normal size, non-tender, without nodularity LYMPH:  no palpable lymphadenopathy in the cervical, axillary or inguinal LUNGS: clear to auscultation and percussion with normal breathing effort HEART: regular rate & rhythm and no murmurs and no lower extremity edema ABDOMEN:abdomen soft, non-tender and normal bowel sounds MUSCULOSKELETAL:no cyanosis of digits and no clubbing  NEURO: alert & oriented x 3 with fluent speech, no focal motor/sensory deficits EXTREMITIES: No lower extremity edema BREAST: No palpable masses or nodules in either right or left breasts. No palpable axillary supraclavicular or infraclavicular adenopathy no breast tenderness or nipple discharge. (exam performed in the presence of a chaperone)  LABORATORY DATA:  I have reviewed the data as listed   Chemistry      Component Value Date/Time   NA 139 06/12/2016 1605   NA 142 01/23/2015 1017   K 4.5 06/12/2016 1605   K 4.8 01/23/2015 1017   CL 103 06/12/2016 1605   CL 105 02/21/2013 0905   CO2 30 06/12/2016 1605   CO2 27 01/23/2015 1017   BUN 18 06/12/2016 1605   BUN 25.0 01/23/2015 1017   CREATININE 1.33 (H) 06/12/2016 1605   CREATININE 1.1 01/23/2015 1017      Component Value Date/Time   CALCIUM 8.8 (L) 06/12/2016 1605   CALCIUM 9.2 01/23/2015 1017   ALKPHOS 54 01/23/2015 1017   AST 17 01/23/2015 1017   ALT 22 01/23/2015 1017   BILITOT 0.50 01/23/2015 1017       Lab Results  Component Value Date   WBC 7.1 06/12/2016   HGB 12.8 06/12/2016   HCT 40.1 06/12/2016   MCV 98.5 06/12/2016   PLT 281 06/12/2016   NEUTROABS 2.6 01/23/2015      ASSESSMENT & PLAN:  Cancer of lower-inner quadrant of left female breast Right breast invasive ductal carcinoma T2, N0, M0 stage II A. with DCIS ER was n.p.o. 100% HER-2 negative Ki-67 15% treated with neoadjuvant antiestrogen therapy followed by left lumpectomy followed by antiestrogen therapy with letrozole since October 2014.   Letrozole toxicities:  1. Arthritis and myalgias  2. Loss of libido  Otherwise tolerating letrozole extremely well without any major problems or concerns.  Breast cancer surveillance: Breast exam 07/24/2016 is normal  Annual mammograms 04/04/2016: Normal. breast density category C  Return to clinic in 1 year for followup   No orders of the defined types were placed in this encounter.  The patient has a good understanding of the overall plan. she agrees with it. she will call with any problems that may develop before the next visit here.  Rulon Eisenmenger, MD 07/24/16

## 2016-07-24 NOTE — Assessment & Plan Note (Addendum)
Right breast invasive ductal carcinoma T2, N0, M0 stage II A. with DCIS ER was n.p.o. 100% HER-2 negative Ki-67 15% treated with neoadjuvant antiestrogen therapy followed by left lumpectomy followed by antiestrogen therapy with letrozole since October 2014.   Letrozole toxicities:  1. Arthritis and myalgias  2. Loss of libido  Otherwise tolerating letrozole extremely well without any major problems or concerns.  Breast cancer surveillance: Breast exam 07/24/2016 is normal  Annual mammograms 04/04/2016: Normal. breast density category C  Return to clinic in 1 year for followup

## 2017-01-01 ENCOUNTER — Encounter: Payer: Self-pay | Admitting: Cardiology

## 2017-01-27 DIAGNOSIS — R413 Other amnesia: Secondary | ICD-10-CM | POA: Insufficient documentation

## 2017-01-27 HISTORY — DX: Other amnesia: R41.3

## 2017-02-03 ENCOUNTER — Inpatient Hospital Stay (HOSPITAL_COMMUNITY)
Admission: EM | Admit: 2017-02-03 | Discharge: 2017-02-05 | DRG: 308 | Disposition: A | Discharge status: Home / Self Care | Payer: Medicare Other | Attending: Internal Medicine | Admitting: Internal Medicine

## 2017-02-03 ENCOUNTER — Encounter (HOSPITAL_COMMUNITY): Payer: Self-pay | Admitting: Emergency Medicine

## 2017-02-03 ENCOUNTER — Emergency Department (HOSPITAL_COMMUNITY): Payer: Medicare Other

## 2017-02-03 DIAGNOSIS — H409 Unspecified glaucoma: Secondary | ICD-10-CM | POA: Diagnosis present

## 2017-02-03 DIAGNOSIS — I2510 Atherosclerotic heart disease of native coronary artery without angina pectoris: Secondary | ICD-10-CM | POA: Diagnosis present

## 2017-02-03 DIAGNOSIS — I471 Supraventricular tachycardia, unspecified: Secondary | ICD-10-CM | POA: Diagnosis present

## 2017-02-03 DIAGNOSIS — R0602 Shortness of breath: Secondary | ICD-10-CM

## 2017-02-03 DIAGNOSIS — E119 Type 2 diabetes mellitus without complications: Secondary | ICD-10-CM | POA: Diagnosis present

## 2017-02-03 DIAGNOSIS — G8929 Other chronic pain: Secondary | ICD-10-CM | POA: Diagnosis present

## 2017-02-03 DIAGNOSIS — R002 Palpitations: Secondary | ICD-10-CM | POA: Diagnosis not present

## 2017-02-03 DIAGNOSIS — Z7982 Long term (current) use of aspirin: Secondary | ICD-10-CM

## 2017-02-03 DIAGNOSIS — M7989 Other specified soft tissue disorders: Secondary | ICD-10-CM

## 2017-02-03 DIAGNOSIS — M545 Low back pain: Secondary | ICD-10-CM | POA: Diagnosis present

## 2017-02-03 DIAGNOSIS — E785 Hyperlipidemia, unspecified: Secondary | ICD-10-CM | POA: Diagnosis present

## 2017-02-03 DIAGNOSIS — E039 Hypothyroidism, unspecified: Secondary | ICD-10-CM | POA: Diagnosis present

## 2017-02-03 DIAGNOSIS — F329 Major depressive disorder, single episode, unspecified: Secondary | ICD-10-CM | POA: Diagnosis present

## 2017-02-03 DIAGNOSIS — F419 Anxiety disorder, unspecified: Secondary | ICD-10-CM | POA: Diagnosis present

## 2017-02-03 DIAGNOSIS — I10 Essential (primary) hypertension: Secondary | ICD-10-CM | POA: Diagnosis present

## 2017-02-03 DIAGNOSIS — Z79811 Long term (current) use of aromatase inhibitors: Secondary | ICD-10-CM

## 2017-02-03 DIAGNOSIS — F32A Depression, unspecified: Secondary | ICD-10-CM | POA: Diagnosis present

## 2017-02-03 DIAGNOSIS — J9601 Acute respiratory failure with hypoxia: Secondary | ICD-10-CM | POA: Diagnosis present

## 2017-02-03 DIAGNOSIS — Z955 Presence of coronary angioplasty implant and graft: Secondary | ICD-10-CM

## 2017-02-03 DIAGNOSIS — Z79899 Other long term (current) drug therapy: Secondary | ICD-10-CM

## 2017-02-03 DIAGNOSIS — M549 Dorsalgia, unspecified: Secondary | ICD-10-CM

## 2017-02-03 DIAGNOSIS — G2581 Restless legs syndrome: Secondary | ICD-10-CM | POA: Diagnosis present

## 2017-02-03 DIAGNOSIS — C50312 Malignant neoplasm of lower-inner quadrant of left female breast: Secondary | ICD-10-CM | POA: Diagnosis present

## 2017-02-03 DIAGNOSIS — K219 Gastro-esophageal reflux disease without esophagitis: Secondary | ICD-10-CM | POA: Diagnosis present

## 2017-02-03 DIAGNOSIS — Z803 Family history of malignant neoplasm of breast: Secondary | ICD-10-CM

## 2017-02-03 LAB — CBC WITH DIFFERENTIAL/PLATELET
BASOS ABS: 0 10*3/uL (ref 0.0–0.1)
BASOS PCT: 0 %
EOS ABS: 0 10*3/uL (ref 0.0–0.7)
Eosinophils Relative: 0 %
HCT: 41.7 % (ref 36.0–46.0)
HEMOGLOBIN: 14.1 g/dL (ref 12.0–15.0)
LYMPHS ABS: 1.1 10*3/uL (ref 0.7–4.0)
Lymphocytes Relative: 23 %
MCH: 31.1 pg (ref 26.0–34.0)
MCHC: 33.8 g/dL (ref 30.0–36.0)
MCV: 92.1 fL (ref 78.0–100.0)
Monocytes Absolute: 0.1 10*3/uL (ref 0.1–1.0)
Monocytes Relative: 3 %
NEUTROS PCT: 74 %
Neutro Abs: 3.5 10*3/uL (ref 1.7–7.7)
PLATELETS: 299 10*3/uL (ref 150–400)
RBC: 4.53 MIL/uL (ref 3.87–5.11)
RDW: 13 % (ref 11.5–15.5)
WBC: 4.8 10*3/uL (ref 4.0–10.5)

## 2017-02-03 LAB — COMPREHENSIVE METABOLIC PANEL
ALBUMIN: 3.6 g/dL (ref 3.5–5.0)
ALK PHOS: 67 U/L (ref 38–126)
ALT: 22 U/L (ref 14–54)
ANION GAP: 8 (ref 5–15)
AST: 28 U/L (ref 15–41)
BUN: 14 mg/dL (ref 6–20)
CALCIUM: 9.3 mg/dL (ref 8.9–10.3)
CO2: 25 mmol/L (ref 22–32)
Chloride: 106 mmol/L (ref 101–111)
Creatinine, Ser: 1.01 mg/dL — ABNORMAL HIGH (ref 0.44–1.00)
GFR calc Af Amer: 58 mL/min — ABNORMAL LOW (ref >60)
GFR calc non Af Amer: 50 mL/min — ABNORMAL LOW (ref >60)
GLUCOSE: 261 mg/dL — AB (ref 65–99)
POTASSIUM: 3.9 mmol/L (ref 3.5–5.1)
SODIUM: 139 mmol/L (ref 135–145)
Total Bilirubin: 0.5 mg/dL (ref 0.3–1.2)
Total Protein: 6.3 g/dL — ABNORMAL LOW (ref 6.5–8.1)

## 2017-02-03 LAB — I-STAT TROPONIN, ED: Troponin i, poc: 0.01 ng/mL (ref 0.00–0.08)

## 2017-02-03 LAB — TSH: TSH: 0.549 u[IU]/mL (ref 0.350–4.500)

## 2017-02-03 LAB — MAGNESIUM: MAGNESIUM: 1.7 mg/dL (ref 1.7–2.4)

## 2017-02-03 MED ORDER — DILTIAZEM LOAD VIA INFUSION
10.0000 mg | Freq: Once | INTRAVENOUS | Status: AC
  Administered 2017-02-03: 10 mg via INTRAVENOUS
  Filled 2017-02-03: qty 10

## 2017-02-03 MED ORDER — DILTIAZEM HCL-DEXTROSE 100-5 MG/100ML-% IV SOLN (PREMIX)
5.0000 mg/h | INTRAVENOUS | Status: DC
  Administered 2017-02-03: 5 mg/h via INTRAVENOUS
  Filled 2017-02-03: qty 100

## 2017-02-03 NOTE — ED Triage Notes (Addendum)
Pt arrives via EMS from independent living facility for tachycardia/SHOB starting this evening. States felt tightness in the throat. EMS noted HR 140s-150s. Gave 500ML NS blous with no change in rate. Room air sat noted 89%, placed on 3L [place]. Pt alert at this time, MD at bedside. Received a cortisone shot earlier today for chronic back pain. 324 MG ASA given PTA.

## 2017-02-03 NOTE — ED Notes (Signed)
Vagal manuevers not effective. Initiated diltiazem infusion

## 2017-02-03 NOTE — ED Provider Notes (Signed)
[facility] DEPT Provider Note   CSN: [id number] Arrival date & time: 02/03/17  2218     History   Chief Complaint Chief Complaint  Patient presents with  . Tachycardia    HPI Wendy Roth is a 83 y.o. female.  HPI 83 yo F with PMHx as below here with chest pressure and SOB. Pt states that around 6:30 PM this afternoon, she developed acute onset of palpitations, neck pressure, and SOB. She had some mild lightheadedness with this. She tried to wait it out but sx persisted so she presents today. Denies any other recent medication changes. Denies any recent fevers or chills. Currently, she endorses ongoing sensation of chest tightness, jaw pressure, and lightheadedness. She had no syncope. No history of DVT or PE. She has not had any known history of arrhythmia. Symptoms do seem to worsen with exertion. Denies any alleviating factors. Symptoms are not positional. Per EMS, patient was found to have heart rate in the 180s on their arrival. It has since decreased to the 140s. Her blood pressure has been stable to elevated.  Past Medical History:  Diagnosis Date  . Anginal pain ([place])   . Anxiety   . Arthritis   . Back pain   . Breast cancer ([place])   . Carpal tunnel syndrome   . Coronary artery disease 2005   stent placed  . Depression   . Diabetes mellitus without complication (HCC)    diet controlled.   . Fatigue   . GERD (gastroesophageal reflux disease)    "years ago" but still takes Prilosec  . Glaucoma    left eye  . History of kidney stones   . Hypertension   . Hypothyroidism   . Restless leg syndrome   . Seizures ([place])    as a baby    Patient Active Problem List   Diagnosis Date Noted  . Umbilical hernia 66/44/0347  . Family history of breast cancer 02/18/2013  . Family history of malignant neoplasm of female breast 02/18/2013  . Cancer of lower-inner quadrant of left female breast ([place]) 10/18/2012    Past Surgical History:  Procedure Laterality Date  .  ABDOMINAL HYSTERECTOMY    . ANGIOPLASTY     x2  . BREAST LUMPECTOMY     left x2  . BREAST LUMPECTOMY WITH NEEDLE LOCALIZATION Right 07/05/2013   Procedure: RIGHT BREAST WIRE GUIDED LUMPECTOMY ;  Surgeon: Rolm Bookbinder, MD;  Location: [facility];  Service: General;  Laterality: Right;  . CARPAL TUNNEL RELEASE     bilateral  . COLONOSCOPY    . CORONARY ANGIOPLASTY  2005   stent  . EYE SURGERY Bilateral    cataracts  . HERNIA REPAIR    . INSERTION OF MESH N/A 06/11/2016   Procedure: INSERTION OF MESH;  Surgeon: Rolm Bookbinder, MD;  Location: [facility];  Service: General;  Laterality: N/A;  . LAPAROSCOPIC INCISIONAL / UMBILICAL / [place]  06/11/2016   UHR w/mesh  . MENISCUS REPAIR     left  . PARATHYROIDECTOMY    . RE-EXCISION OF BREAST CANCER,SUPERIOR MARGINS Right 08/01/2013   Procedure: RE-EXCISION OF BREAST CANCER MARGIN;  Surgeon: Rolm Bookbinder, MD;  Location: [facility];  Service: General;  Laterality: Right;  . TONSILLECTOMY    . UMBILICAL HERNIA REPAIR N/A 06/11/2016   Procedure: LAPAROSCOPIC UMBILICAL HERNIA;  Surgeon: Rolm Bookbinder, MD;  Location: [facility];  Service: General;  Laterality: N/A;    OB History    No data available  Home Medications    Prior to Admission medications   Medication Sig Start Date End Date Taking? Authorizing Provider  amLODipine (NORVASC) 5 MG tablet Take 5 mg by mouth at bedtime.     Historical Provider, MD  aspirin 81 MG tablet Take 81 mg by mouth daily.    Historical Provider, MD  atorvastatin (LIPITOR) 20 MG tablet Take 20 mg by mouth at bedtime.    Historical Provider, MD  buPROPion (WELLBUTRIN SR) 200 MG 12 hr tablet Take 200 mg by mouth daily.    Historical Provider, MD  CALCIUM-MAGNESIUM-VITAMIN D PO Take 1 tablet by mouth 2 (two) times daily.    Historical Provider, MD  clonazePAM (KLONOPIN) 0.5 MG tablet Take 0.5 mg by mouth at bedtime as needed for anxiety.    Historical Provider, MD  DULoxetine (CYMBALTA) 60 MG  capsule Take 60 mg by mouth 2 (two) times daily.    Historical Provider, MD  famotidine (PEPCID) 20 MG tablet Take 20 mg by mouth at bedtime.     Historical Provider, MD  latanoprost (XALATAN) 0.005 % ophthalmic solution Place 1 drop into both eyes at bedtime.    Historical Provider, MD  letrozole [facility]) 2.5 MG tablet TAKE 1 TABLET DAILY 06/17/16   Nicholas Lose, MD  levothyroxine (SYNTHROID, LEVOTHROID) 25 MCG tablet Take 75-100 mcg by mouth See admin instructions. Mon Wed Fri 100 mcg, and Sun Tues Thurs Sat 75 mcg    Historical Provider, MD  lisinopril (PRINIVIL,ZESTRIL) 20 MG tablet Take 20 mg by mouth daily.    Historical Provider, MD  metoprolol succinate (TOPROL-XL) 50 MG 24 hr tablet Take 50 mg by mouth daily. Take with or immediately following a meal.    Historical Provider, MD  omeprazole (PRILOSEC) 40 MG capsule Take 40 mg by mouth daily.    Historical Provider, MD  oxyCODONE (OXY IR/ROXICODONE) 5 MG immediate release tablet Take 1-2 tablets (5-10 mg total) by mouth every 4 (four) hours as needed for moderate pain. 06/12/16   Rolm Bookbinder, MD  rOPINIRole (REQUIP) 0.25 MG tablet Take 0.5 mg by mouth at bedtime.    Historical Provider, MD    Family History Family History  Problem Relation Age of Onset  . Cancer Mother 3    lung cancer   . Cancer Father 62    possible breast cancer  . Cancer Sister     lung cancer ? primary; paternal half sister  . Breast cancer Maternal Aunt 70    ? bilateral  . Cancer Maternal Grandmother 79    prostate cancer    Social History Social History  Substance Use Topics  . Smoking status: Never Smoker  . Smokeless tobacco: Never Used  . Alcohol use Yes     Comment: socially     Allergies   No known allergies   Review of Systems Review of Systems  Constitutional: Positive for fatigue.  Respiratory: Positive for shortness of breath.   Cardiovascular: Positive for chest pain and palpitations.  Neurological: Positive for weakness and  light-headedness.  Psychiatric/Behavioral: The patient is nervous/anxious.   All other systems reviewed and are negative.    Physical Exam Updated Vital Signs BP (!) 168/81   Pulse 82   Temp 98.5 F (36.9 C) (Tympanic)   Resp 13   SpO2 98%   Physical Exam  Constitutional: She is oriented to person, place, and time. She appears well-developed and well-nourished. She appears distressed.  HENT:  Head: Normocephalic and atraumatic.  Eyes: Conjunctivae are normal.  Neck: Neck supple.  Cardiovascular: Regular rhythm, normal heart sounds and intact distal pulses.  Tachycardia present.  Exam reveals no friction rub.   No murmur heard. Pulmonary/Chest: Effort normal and breath sounds normal. No respiratory distress. She has no wheezes. She has no rales.  Abdominal: She exhibits no distension.  Musculoskeletal: She exhibits no edema.  Neurological: She is alert and oriented to person, place, and time. She exhibits normal muscle tone.  Skin: Skin is warm. Capillary refill takes less than 2 seconds.  Psychiatric: She has a normal mood and affect.  Nursing note and vitals reviewed.    ED Treatments / Results  Labs (all labs ordered are listed, but only abnormal results are displayed) Labs Reviewed  COMPREHENSIVE METABOLIC PANEL - Abnormal; Notable for the following:       Result Value   Glucose, Bld 261 (*)    Creatinine, Ser 1.01 (*)    Total Protein 6.3 (*)    GFR calc non Af Amer 50 (*)    GFR calc Af Amer 58 (*)    All other components within normal limits  CBC WITH DIFFERENTIAL/PLATELET  MAGNESIUM  TSH  I-STAT TROPOININ, ED    EKG  EKG Interpretation  Date/Time:  Tuesday Feb 03 2017 22:46:11 EDT Ventricular Rate:  84 PR Interval:    QRS Duration: 89 QT Interval:  390 QTC Calculation: 461 R Axis:   45 Text Interpretation:  Sinus rhythm Borderline T wave abnormalities Since last tracing, sinus rhythm has replaced SVT Confirmed by Ellender Hose MD, Lysbeth Galas [phone]) on  02/03/2017 11:03:05 PM       Radiology Dg Chest Portable 1 View  Result Date: 02/03/2017 CLINICAL DATA:  Tachycardia and dyspnea this evening. EXAM: PORTABLE CHEST 1 VIEW COMPARISON:  06/12/2016 FINDINGS: Mild chronic right hemidiaphragm elevation. No airspace consolidation. No large effusion. Normal pulmonary vasculature. Normal hilar and mediastinal contours, unchanged. IMPRESSION: No acute cardiopulmonary findings. Electronically Signed   By: Andreas Newport M.D.   On: 02/03/2017 23:05    Procedures .Critical Care Performed by: Duffy Bruce Authorized by: Duffy Bruce   Critical care provider statement:    Critical care time (minutes):  35   Critical care time was exclusive of:  Separately billable procedures and treating other patients   Critical care was necessary to treat or prevent imminent or life-threatening deterioration of the following conditions:  Cardiac failure and circulatory failure   Critical care was time spent personally by me on the following activities:  Development of treatment plan with patient or surrogate, discussions with consultants, evaluation of patient's response to treatment, examination of patient, obtaining history from patient or surrogate, ordering and performing treatments and interventions, ordering and review of laboratory studies, ordering and review of radiographic studies, pulse oximetry, re-evaluation of patient's condition and review of old charts   I assumed direction of critical care for this patient from another provider in my specialty: no     (including critical care time)  Medications Ordered in ED Medications  diltiazem (CARDIZEM) 1 mg/mL load via infusion 10 mg (10 mg Intravenous Bolus from Bag 02/03/17 2237)    And  diltiazem (CARDIZEM) 100 mg in dextrose 5% 122mL (1 mg/mL) infusion (0 mg/hr Intravenous Stopped 02/03/17 2257)     Initial Impression / Assessment and Plan / ED Course  I have reviewed the triage vital signs and the  nursing notes.  Pertinent labs & imaging results that were available during my care of the patient were reviewed by me and considered  in my medical decision making (see chart for details).     83 year old female with past medical history as above here with new onset supraventricular tachycardia followed by suspected atrial flutter with 2:1 block. I suspect this is secondary to recent steroid injection. Following diltiazem bolus and drip, patient had spontaneous conversion to what appears to be sinus rhythm. This is ideal as her symptoms were also less than 6 hours in onset. However, given that she was just recently treated with steroids and likely has a high risk of returning to atrial flutter, will admit for further monitoring. She is on a diltiazem drip at 5 mg per hour.  Final Clinical Impressions(s) / ED Diagnoses   Final diagnoses:  Hypothyroidism, unspecified type  Gastroesophageal reflux disease without esophagitis  Anxiety  SVT (supraventricular tachycardia) (HCC)    New Prescriptions New Prescriptions   No medications on file     Duffy Bruce, MD 02/04/17 1159

## 2017-02-03 NOTE — ED Notes (Signed)
MD at bedside, attempting vagal manuevers. Placed on 2L [place]

## 2017-02-03 NOTE — ED Notes (Signed)
Pt resides at [facility] at [place]

## 2017-02-03 NOTE — ED Notes (Signed)
Escorted to bathroom by RN, pt ambulatory without assistance.

## 2017-02-04 ENCOUNTER — Encounter (HOSPITAL_COMMUNITY): Payer: Self-pay | Admitting: Internal Medicine

## 2017-02-04 ENCOUNTER — Inpatient Hospital Stay (HOSPITAL_COMMUNITY): Payer: Medicare Other

## 2017-02-04 DIAGNOSIS — F419 Anxiety disorder, unspecified: Secondary | ICD-10-CM | POA: Diagnosis present

## 2017-02-04 DIAGNOSIS — C50312 Malignant neoplasm of lower-inner quadrant of left female breast: Secondary | ICD-10-CM | POA: Diagnosis not present

## 2017-02-04 DIAGNOSIS — I2510 Atherosclerotic heart disease of native coronary artery without angina pectoris: Secondary | ICD-10-CM | POA: Diagnosis present

## 2017-02-04 DIAGNOSIS — H409 Unspecified glaucoma: Secondary | ICD-10-CM | POA: Diagnosis present

## 2017-02-04 DIAGNOSIS — K219 Gastro-esophageal reflux disease without esophagitis: Secondary | ICD-10-CM

## 2017-02-04 DIAGNOSIS — M48062 Spinal stenosis, lumbar region with neurogenic claudication: Secondary | ICD-10-CM | POA: Insufficient documentation

## 2017-02-04 DIAGNOSIS — F329 Major depressive disorder, single episode, unspecified: Secondary | ICD-10-CM | POA: Diagnosis present

## 2017-02-04 DIAGNOSIS — E039 Hypothyroidism, unspecified: Secondary | ICD-10-CM | POA: Diagnosis present

## 2017-02-04 DIAGNOSIS — R0602 Shortness of breath: Secondary | ICD-10-CM | POA: Diagnosis not present

## 2017-02-04 DIAGNOSIS — I4892 Unspecified atrial flutter: Secondary | ICD-10-CM | POA: Diagnosis not present

## 2017-02-04 DIAGNOSIS — I471 Supraventricular tachycardia: Principal | ICD-10-CM

## 2017-02-04 DIAGNOSIS — F320 Major depressive disorder, single episode, mild: Secondary | ICD-10-CM | POA: Diagnosis not present

## 2017-02-04 DIAGNOSIS — E119 Type 2 diabetes mellitus without complications: Secondary | ICD-10-CM | POA: Diagnosis present

## 2017-02-04 DIAGNOSIS — G8929 Other chronic pain: Secondary | ICD-10-CM | POA: Diagnosis present

## 2017-02-04 DIAGNOSIS — E785 Hyperlipidemia, unspecified: Secondary | ICD-10-CM | POA: Diagnosis not present

## 2017-02-04 DIAGNOSIS — Z803 Family history of malignant neoplasm of breast: Secondary | ICD-10-CM | POA: Diagnosis not present

## 2017-02-04 DIAGNOSIS — M549 Dorsalgia, unspecified: Secondary | ICD-10-CM

## 2017-02-04 DIAGNOSIS — M545 Low back pain: Secondary | ICD-10-CM | POA: Diagnosis present

## 2017-02-04 DIAGNOSIS — R002 Palpitations: Secondary | ICD-10-CM | POA: Diagnosis present

## 2017-02-04 DIAGNOSIS — Z79811 Long term (current) use of aromatase inhibitors: Secondary | ICD-10-CM | POA: Diagnosis not present

## 2017-02-04 DIAGNOSIS — Z79899 Other long term (current) drug therapy: Secondary | ICD-10-CM | POA: Diagnosis not present

## 2017-02-04 DIAGNOSIS — Z7982 Long term (current) use of aspirin: Secondary | ICD-10-CM | POA: Diagnosis not present

## 2017-02-04 DIAGNOSIS — Z955 Presence of coronary angioplasty implant and graft: Secondary | ICD-10-CM | POA: Diagnosis not present

## 2017-02-04 DIAGNOSIS — G2581 Restless legs syndrome: Secondary | ICD-10-CM | POA: Diagnosis present

## 2017-02-04 DIAGNOSIS — J9601 Acute respiratory failure with hypoxia: Secondary | ICD-10-CM

## 2017-02-04 DIAGNOSIS — F32A Depression, unspecified: Secondary | ICD-10-CM | POA: Diagnosis present

## 2017-02-04 DIAGNOSIS — I10 Essential (primary) hypertension: Secondary | ICD-10-CM | POA: Diagnosis present

## 2017-02-04 HISTORY — DX: Acute respiratory failure with hypoxia: J96.01

## 2017-02-04 HISTORY — DX: Other chronic pain: G89.29

## 2017-02-04 HISTORY — DX: Spinal stenosis, lumbar region with neurogenic claudication: M48.062

## 2017-02-04 HISTORY — DX: Hyperlipidemia, unspecified: E78.5

## 2017-02-04 LAB — LIPID PANEL
Cholesterol: 119 mg/dL (ref 0–200)
HDL: 55 mg/dL (ref >40)
LDL CALC: 50 mg/dL (ref 0–99)
Total CHOL/HDL Ratio: 2.2 RATIO
Triglycerides: 69 mg/dL (ref <150)
VLDL: 14 mg/dL (ref 0–40)

## 2017-02-04 LAB — BASIC METABOLIC PANEL
Anion gap: 9 (ref 5–15)
BUN: 15 mg/dL (ref 6–20)
CO2: 25 mmol/L (ref 22–32)
CREATININE: 0.88 mg/dL (ref 0.44–1.00)
Calcium: 9.1 mg/dL (ref 8.9–10.3)
Chloride: 106 mmol/L (ref 101–111)
GFR calc Af Amer: >60 mL/min (ref >60)
GFR, EST NON AFRICAN AMERICAN: 60 mL/min — AB (ref >60)
GLUCOSE: 151 mg/dL — AB (ref 65–99)
POTASSIUM: 4 mmol/L (ref 3.5–5.1)
SODIUM: 140 mmol/L (ref 135–145)

## 2017-02-04 LAB — CBC
HEMATOCRIT: 36.8 % (ref 36.0–46.0)
HEMOGLOBIN: 12.3 g/dL (ref 12.0–15.0)
MCH: 30.6 pg (ref 26.0–34.0)
MCHC: 33.4 g/dL (ref 30.0–36.0)
MCV: 91.5 fL (ref 78.0–100.0)
Platelets: 286 10*3/uL (ref 150–400)
RBC: 4.02 MIL/uL (ref 3.87–5.11)
RDW: 13 % (ref 11.5–15.5)
WBC: 8.4 10*3/uL (ref 4.0–10.5)

## 2017-02-04 LAB — GLUCOSE, CAPILLARY
GLUCOSE-CAPILLARY: 156 mg/dL — AB (ref 65–99)
GLUCOSE-CAPILLARY: 152 mg/dL — AB (ref 65–99)
Glucose-Capillary: 124 mg/dL — ABNORMAL HIGH (ref 65–99)
Glucose-Capillary: 161 mg/dL — ABNORMAL HIGH (ref 65–99)

## 2017-02-04 LAB — PROTIME-INR
INR: 1.09
PROTHROMBIN TIME: 14.1 s (ref 11.4–15.2)

## 2017-02-04 LAB — TROPONIN I

## 2017-02-04 LAB — D-DIMER, QUANTITATIVE (NOT AT ARMC): D DIMER QUANT: 0.51 ug{FEU}/mL — AB (ref 0.00–0.50)

## 2017-02-04 MED ORDER — ONDANSETRON HCL 4 MG/2ML IJ SOLN: 4.0000 mg | Freq: Four times a day (QID) | INTRAMUSCULAR | Status: DC | PRN

## 2017-02-04 MED ORDER — LEVOTHYROXINE SODIUM 100 MCG PO TABS
100.0000 ug | ORAL_TABLET | ORAL | Status: DC
  Administered 2017-02-04: 100 ug via ORAL
  Filled 2017-02-04: qty 1

## 2017-02-04 MED ORDER — DULOXETINE HCL 60 MG PO CPEP
60.0000 mg | ORAL_CAPSULE | Freq: Two times a day (BID) | ORAL | Status: DC
  Administered 2017-02-04 – 2017-02-05 (×3): 60 mg via ORAL
  Filled 2017-02-04 (×4): qty 1

## 2017-02-04 MED ORDER — PANTOPRAZOLE SODIUM 40 MG PO TBEC
40.0000 mg | DELAYED_RELEASE_TABLET | Freq: Every day | ORAL | Status: DC
  Administered 2017-02-04 – 2017-02-05 (×2): 40 mg via ORAL
  Filled 2017-02-04 (×2): qty 1

## 2017-02-04 MED ORDER — LISINOPRIL 10 MG PO TABS
20.0000 mg | ORAL_TABLET | Freq: Every day | ORAL | Status: DC
  Administered 2017-02-04 – 2017-02-05 (×2): 20 mg via ORAL
  Filled 2017-02-04: qty 1
  Filled 2017-02-04 (×2): qty 2

## 2017-02-04 MED ORDER — MAGNESIUM OXIDE 400 (241.3 MG) MG PO TABS
200.0000 mg | ORAL_TABLET | Freq: Two times a day (BID) | ORAL | Status: DC
  Administered 2017-02-04 (×2): 200 mg via ORAL
  Filled 2017-02-04 (×2): qty 1

## 2017-02-04 MED ORDER — CLONAZEPAM 0.5 MG PO TABS
0.5000 mg | ORAL_TABLET | Freq: Two times a day (BID) | ORAL | Status: DC | PRN
  Administered 2017-02-04: 0.5 mg via ORAL
  Filled 2017-02-04: qty 1

## 2017-02-04 MED ORDER — METOPROLOL SUCCINATE ER 50 MG PO TB24
50.0000 mg | ORAL_TABLET | Freq: Every day | ORAL | Status: DC
  Administered 2017-02-04 – 2017-02-05 (×2): 50 mg via ORAL
  Filled 2017-02-04 (×3): qty 1

## 2017-02-04 MED ORDER — ONDANSETRON HCL 4 MG PO TABS: 4.0000 mg | ORAL_TABLET | Freq: Four times a day (QID) | ORAL | Status: DC | PRN

## 2017-02-04 MED ORDER — ASPIRIN 81 MG PO CHEW
81.0000 mg | CHEWABLE_TABLET | Freq: Every evening | ORAL | Status: DC
  Administered 2017-02-04 – 2017-02-05 (×2): 81 mg via ORAL
  Filled 2017-02-04 (×2): qty 1

## 2017-02-04 MED ORDER — CALCIUM CARBONATE-VITAMIN D 500-200 MG-UNIT PO TABS
1.0000 | ORAL_TABLET | Freq: Two times a day (BID) | ORAL | Status: DC
  Administered 2017-02-04 – 2017-02-05 (×4): 1 via ORAL
  Filled 2017-02-04 (×4): qty 1

## 2017-02-04 MED ORDER — LEVOTHYROXINE SODIUM 75 MCG PO TABS
75.0000 ug | ORAL_TABLET | ORAL | Status: DC
  Administered 2017-02-05: 75 ug via ORAL
  Filled 2017-02-04: qty 1

## 2017-02-04 MED ORDER — ZOLPIDEM TARTRATE 5 MG PO TABS: 5.0000 mg | ORAL_TABLET | Freq: Every evening | ORAL | Status: DC | PRN

## 2017-02-04 MED ORDER — AMLODIPINE BESYLATE 5 MG PO TABS
5.0000 mg | ORAL_TABLET | Freq: Every day | ORAL | Status: DC
  Administered 2017-02-04: 5 mg via ORAL
  Filled 2017-02-04: qty 1

## 2017-02-04 MED ORDER — LETROZOLE 2.5 MG PO TABS
2.5000 mg | ORAL_TABLET | Freq: Every day | ORAL | Status: DC
  Administered 2017-02-04 – 2017-02-05 (×2): 2.5 mg via ORAL
  Filled 2017-02-04 (×2): qty 1

## 2017-02-04 MED ORDER — IOPAMIDOL (ISOVUE-370) INJECTION 76%
INTRAVENOUS | Status: AC
  Administered 2017-02-04: 80 mL
  Filled 2017-02-04: qty 100

## 2017-02-04 MED ORDER — DILTIAZEM HCL 60 MG PO TABS
60.0000 mg | ORAL_TABLET | Freq: Three times a day (TID) | ORAL | Status: DC
  Administered 2017-02-04 – 2017-02-05 (×3): 60 mg via ORAL
  Filled 2017-02-04 (×3): qty 1

## 2017-02-04 MED ORDER — OXYCODONE HCL 5 MG PO TABS: 5.0000 mg | ORAL_TABLET | ORAL | Status: DC | PRN

## 2017-02-04 MED ORDER — SODIUM CHLORIDE 0.9 % IV SOLN
INTRAVENOUS | Status: AC
  Administered 2017-02-04: 11:00:00 via INTRAVENOUS

## 2017-02-04 MED ORDER — FAMOTIDINE 20 MG PO TABS
20.0000 mg | ORAL_TABLET | Freq: Every day | ORAL | Status: DC
  Administered 2017-02-04 (×2): 20 mg via ORAL
  Filled 2017-02-04 (×2): qty 1

## 2017-02-04 MED ORDER — MAGNESIUM OXIDE 400 (241.3 MG) MG PO TABS
400.0000 mg | ORAL_TABLET | Freq: Every day | ORAL | Status: DC
Start: 2017-02-04 — End: 2017-02-05
  Administered 2017-02-04 – 2017-02-05 (×2): 400 mg via ORAL
  Filled 2017-02-04: qty 1

## 2017-02-04 MED ORDER — ROPINIROLE HCL 1 MG PO TABS
0.5000 mg | ORAL_TABLET | Freq: Every day | ORAL | Status: DC
  Administered 2017-02-04 (×2): 0.5 mg via ORAL
  Filled 2017-02-04 (×3): qty 1

## 2017-02-04 MED ORDER — INSULIN ASPART 100 UNIT/ML ~~LOC~~ SOLN: 0.0000 [IU] | Freq: Every day | SUBCUTANEOUS | Status: DC

## 2017-02-04 MED ORDER — ACETAMINOPHEN 325 MG PO TABS: 650.0000 mg | ORAL_TABLET | Freq: Four times a day (QID) | ORAL | Status: DC | PRN

## 2017-02-04 MED ORDER — ASPIRIN 81 MG PO CHEW
324.0000 mg | CHEWABLE_TABLET | Freq: Once | ORAL | Status: AC
  Administered 2017-02-04: 324 mg via ORAL
  Filled 2017-02-04: qty 4

## 2017-02-04 MED ORDER — DILTIAZEM HCL 60 MG PO TABS
60.0000 mg | ORAL_TABLET | Freq: Two times a day (BID) | ORAL | Status: DC
  Administered 2017-02-04: 60 mg via ORAL
  Filled 2017-02-04: qty 1

## 2017-02-04 MED ORDER — LATANOPROST 0.005 % OP SOLN
1.0000 [drp] | Freq: Every day | OPHTHALMIC | Status: DC
  Filled 2017-02-04 (×3): qty 2.5

## 2017-02-04 MED ORDER — ATORVASTATIN CALCIUM 20 MG PO TABS
20.0000 mg | ORAL_TABLET | Freq: Every day | ORAL | Status: DC
  Administered 2017-02-04 (×2): 20 mg via ORAL
  Filled 2017-02-04 (×2): qty 1

## 2017-02-04 MED ORDER — INSULIN ASPART 100 UNIT/ML ~~LOC~~ SOLN: 0.0000 [IU] | Freq: Three times a day (TID) | SUBCUTANEOUS | Status: DC

## 2017-02-04 MED ORDER — CALCIUM-MAGNESIUM-VITAMIN D 500-250-125 MG-MG-UNIT PO TABS: 1.0000 | ORAL_TABLET | Freq: Two times a day (BID) | ORAL | Status: DC

## 2017-02-04 MED ORDER — BUPROPION HCL ER (SR) 100 MG PO TB12
200.0000 mg | ORAL_TABLET | Freq: Every day | ORAL | Status: DC
  Administered 2017-02-04 – 2017-02-05 (×2): 200 mg via ORAL
  Filled 2017-02-04 (×2): qty 2

## 2017-02-04 MED ORDER — ENOXAPARIN SODIUM 40 MG/0.4ML ~~LOC~~ SOLN
40.0000 mg | SUBCUTANEOUS | Status: DC
  Administered 2017-02-04 – 2017-02-05 (×2): 40 mg via SUBCUTANEOUS
  Filled 2017-02-04 (×2): qty 0.4

## 2017-02-04 MED ORDER — SODIUM CHLORIDE 0.9% FLUSH
3.0000 mL | Freq: Two times a day (BID) | INTRAVENOUS | Status: DC
  Administered 2017-02-04 – 2017-02-05 (×4): 3 mL via INTRAVENOUS

## 2017-02-04 MED ORDER — ACETAMINOPHEN 650 MG RE SUPP: 650.0000 mg | Freq: Four times a day (QID) | RECTAL | Status: DC | PRN

## 2017-02-04 NOTE — ED Notes (Signed)
Dr. Niu at bedside at this time.  

## 2017-02-04 NOTE — H&P (Signed)
History and Physical    Wendy Roth GYK:[id number] DOB: [birth year] DOA: 02/03/2017  Referring MD/NP/PA:   PCP: Linus Mako, NP   Patient coming from:  The patient is coming from home.  At baseline, pt is independent for most of ADL.  Chief Complaint: Shortness of breath, palpitation  HPI: Wendy Roth is a 83 y.o. female with medical history significant of hypertension, hyperlipidemia, diabetes mellitus, GERD, depression, anxiety, as well as, CAD, breast cancer on letrozole (s/p of lumpectomy 2014), chronic back pain, who presents with shortness breath, palpitation.  Patient states that she started having palpitation and SOB in the evening. Per report, patient was found to have heart rate up to 140-150, O2 sat 89% on room air. She states that she feels like her throat is closing, but he denies any chest pain. No pain in calf area. No cough, fever or chills. She had mild epigastric abdominal discomfort, which has resolved. No nausea, vomiting or diarrhea. No symptoms of UTI. She has mild headache. Patient states that he had steroid injection for lower back pain early today. Pt was started with IV cardizem gtt in ED. Her heart rate improved to 80s. Her shortness breath and palpitation have resolved. I personally moved her nasal cannula oxygen, she is saturating at 96% on room air. Currently, no chest pain, shortness breath or palpitation.  ED Course: pt was found to have SVT vs. Possible A flutter with HR 146, negative troponin, WBC 4.8, creatinine 1.01, temperature normal, negative chest x-ray. Patient is admitted to stepdown as inpatient.  Review of Systems:   General: no fevers, chills, no changes in body weight, has fatigue HEENT: no blurry vision, hearing changes or sore throat Respiratory: has dyspnea, no coughing, wheezing CV: no chest pain, has palpitations GI: no nausea, vomiting, abdominal pain, diarrhea, constipation GU: no dysuria, burning on urination,  increased urinary frequency, hematuria  Ext: no leg edema Neuro: no unilateral weakness, numbness, or tingling, no vision change or hearing loss Skin: no rash, no skin tear. MSK: No muscle spasm, no deformity, no limitation of range of movement in spin Heme: No easy bruising.  Travel history: No recent long distant travel.  Allergy:  Allergies  Allergen Reactions  . No Known Allergies     Past Medical History:  Diagnosis Date  . Anginal pain ([place])   . Anxiety   . Arthritis   . Back pain   . Breast cancer ([place])   . Carpal tunnel syndrome   . Coronary artery disease 2005   stent placed  . Depression   . Diabetes mellitus without complication (HCC)    diet controlled.   . Fatigue   . GERD (gastroesophageal reflux disease)    "years ago" but still takes Prilosec  . Glaucoma    left eye  . History of kidney stones   . Hypertension   . Hypothyroidism   . Restless leg syndrome   . Seizures ([place])    as a baby    Past Surgical History:  Procedure Laterality Date  . ABDOMINAL HYSTERECTOMY    . ANGIOPLASTY     x2  . BREAST LUMPECTOMY     left x2  . BREAST LUMPECTOMY WITH NEEDLE LOCALIZATION Right 07/05/2013   Procedure: RIGHT BREAST WIRE GUIDED LUMPECTOMY ;  Surgeon: Rolm Bookbinder, MD;  Location: [facility];  Service: General;  Laterality: Right;  . CARPAL TUNNEL RELEASE     bilateral  . COLONOSCOPY    . CORONARY ANGIOPLASTY  2005  stent  . EYE SURGERY Bilateral    cataracts  . HERNIA REPAIR    . INSERTION OF MESH N/A 06/11/2016   Procedure: INSERTION OF MESH;  Surgeon: Rolm Bookbinder, MD;  Location: [facility];  Service: General;  Laterality: N/A;  . LAPAROSCOPIC INCISIONAL / UMBILICAL / [place]  06/11/2016   UHR w/mesh  . MENISCUS REPAIR     left  . PARATHYROIDECTOMY    . RE-EXCISION OF BREAST CANCER,SUPERIOR MARGINS Right 08/01/2013   Procedure: RE-EXCISION OF BREAST CANCER MARGIN;  Surgeon: Rolm Bookbinder, MD;  Location: [facility];  Service: General;   Laterality: Right;  . TONSILLECTOMY    . UMBILICAL HERNIA REPAIR N/A 06/11/2016   Procedure: LAPAROSCOPIC UMBILICAL HERNIA;  Surgeon: Rolm Bookbinder, MD;  Location: [facility];  Service: General;  Laterality: N/A;    Social History:  reports that she has never smoked. She has never used smokeless tobacco. She reports that she drinks alcohol. She reports that she does not use drugs.  Family History:  Family History  Problem Relation Age of Onset  . Cancer Mother 38    lung cancer   . Cancer Father 34    possible breast cancer  . Cancer Sister     lung cancer ? primary; paternal half sister  . Breast cancer Maternal Aunt 70    ? bilateral  . Cancer Maternal Grandmother 79    prostate cancer     Prior to Admission medications   Medication Sig Start Date End Date Taking? Authorizing Provider  amLODipine (NORVASC) 5 MG tablet Take 5 mg by mouth at bedtime.     Historical Provider, MD  aspirin 81 MG tablet Take 81 mg by mouth daily.    Historical Provider, MD  atorvastatin (LIPITOR) 20 MG tablet Take 20 mg by mouth at bedtime.    Historical Provider, MD  buPROPion (WELLBUTRIN SR) 200 MG 12 hr tablet Take 200 mg by mouth daily.    Historical Provider, MD  CALCIUM-MAGNESIUM-VITAMIN D PO Take 1 tablet by mouth 2 (two) times daily.    Historical Provider, MD  clonazePAM (KLONOPIN) 0.5 MG tablet Take 0.5 mg by mouth at bedtime as needed for anxiety.    Historical Provider, MD  DULoxetine (CYMBALTA) 60 MG capsule Take 60 mg by mouth 2 (two) times daily.    Historical Provider, MD  famotidine (PEPCID) 20 MG tablet Take 20 mg by mouth at bedtime.     Historical Provider, MD  latanoprost (XALATAN) 0.005 % ophthalmic solution Place 1 drop into both eyes at bedtime.    Historical Provider, MD  letrozole [facility]) 2.5 MG tablet TAKE 1 TABLET DAILY 06/17/16   Nicholas Lose, MD  levothyroxine (SYNTHROID, LEVOTHROID) 25 MCG tablet Take 75-100 mcg by mouth See admin instructions. Mon Wed Fri 100 mcg, and Sun  Tues Thurs Sat 75 mcg    Historical Provider, MD  lisinopril (PRINIVIL,ZESTRIL) 20 MG tablet Take 20 mg by mouth daily.    Historical Provider, MD  metoprolol succinate (TOPROL-XL) 50 MG 24 hr tablet Take 50 mg by mouth daily. Take with or immediately following a meal.    Historical Provider, MD  omeprazole (PRILOSEC) 40 MG capsule Take 40 mg by mouth daily.    Historical Provider, MD  oxyCODONE (OXY IR/ROXICODONE) 5 MG immediate release tablet Take 1-2 tablets (5-10 mg total) by mouth every 4 (four) hours as needed for moderate pain. 06/12/16   Rolm Bookbinder, MD  rOPINIRole (REQUIP) 0.25 MG tablet Take 0.5 mg by  mouth at bedtime.    Historical Provider, MD    Physical Exam: Vitals:   02/04/17 0145 02/04/17 0200 02/04/17 0215 02/04/17 0300  BP: 139/81 126/63 (!) 127/56 139/63  Pulse: 83 (!) 113 75 73  Resp: 16 16 20 20   Temp:    98.4 F (36.9 C)  TempSrc:    Oral  SpO2: 97% 91% 96% 97%  Weight:    73.2 kg (161 lb 6.4 oz)  Height:    5' (1.524 m)   General: Not in acute distress HEENT:       Eyes: PERRL, EOMI, no scleral icterus.       ENT: No discharge from the ears and nose, no pharynx injection, no tonsillar enlargement.        Neck: No JVD, no bruit, no mass felt. Heme: No neck lymph node enlargement. Cardiac: S1/S2, RRR, No murmurs, No gallops or rubs. Respiratory: No rales, wheezing, rhonchi or rubs. GI: Soft, nondistended, nontender, no rebound pain, no organomegaly, BS present. GU: No hematuria Ext: No pitting leg edema bilaterally. 2+DP/PT pulse bilaterally. Musculoskeletal: No joint deformities, No joint redness or warmth, no limitation of ROM in spin. Skin: No rashes.  Neuro: Alert, oriented X3, cranial nerves II-XII grossly intact, moves all extremities normally.  Psych: Patient is not psychotic, no suicidal or hemocidal ideation.  Labs on Admission: I have personally reviewed following labs and imaging studies  CBC:  Recent Labs Lab 02/03/17 2226  WBC 4.8    NEUTROABS 3.5  HGB 14.1  HCT 41.7  MCV 92.1  PLT 130   Basic Metabolic Panel:  Recent Labs Lab 02/03/17 2226  NA 139  K 3.9  CL 106  CO2 25  GLUCOSE 261*  BUN 14  CREATININE 1.01*  CALCIUM 9.3  MG 1.7   GFR: Estimated Creatinine Clearance: 38.4 mL/min (A) (by C-G formula based on SCr of 1.01 mg/dL (H)). Liver Function Tests:  Recent Labs Lab 02/03/17 2226  AST 28  ALT 22  ALKPHOS 67  BILITOT 0.5  PROT 6.3*  ALBUMIN 3.6   No results for input(s): LIPASE, AMYLASE in the last 168 hours. No results for input(s): AMMONIA in the last 168 hours. Coagulation Profile: No results for input(s): INR, PROTIME in the last 168 hours. Cardiac Enzymes:  Recent Labs Lab 02/04/17 0235  TROPONINI <0.03   BNP (last 3 results) No results for input(s): PROBNP in the last 8760 hours. HbA1C: No results for input(s): HGBA1C in the last 72 hours. CBG:  Recent Labs Lab 02/04/17 0603  GLUCAP 156*   Lipid Profile:  Recent Labs  02/04/17 0235  CHOL 119  HDL 55  LDLCALC 50  TRIG 69  CHOLHDL 2.2   Thyroid Function Tests:  Recent Labs  02/03/17 2251  TSH 0.549   Anemia Panel: No results for input(s): VITAMINB12, FOLATE, FERRITIN, TIBC, IRON, RETICCTPCT in the last 72 hours. Urine analysis: No results found for: COLORURINE, APPEARANCEUR, LABSPEC, PHURINE, GLUCOSEU, HGBUR, BILIRUBINUR, KETONESUR, PROTEINUR, UROBILINOGEN, NITRITE, LEUKOCYTESUR Sepsis Labs: @LABRCNTIP (procalcitonin:4,lacticidven:4) )No results found for this or any previous visit (from the past 240 hour(s)).   Radiological Exams on Admission: Dg Chest Portable 1 View  Result Date: 02/03/2017 CLINICAL DATA:  Tachycardia and dyspnea this evening. EXAM: PORTABLE CHEST 1 VIEW COMPARISON:  06/12/2016 FINDINGS: Mild chronic right hemidiaphragm elevation. No airspace consolidation. No large effusion. Normal pulmonary vasculature. Normal hilar and mediastinal contours, unchanged. IMPRESSION: No acute  cardiopulmonary findings. Electronically Signed   By: Andreas Newport M.D.   On:  02/03/2017 23:05     EKG: Independently reviewed.  SVT vs. A flutter with RVR, HR 146, early R-wave progression. Nonspecific T-wave change.   Assessment/Plan Principal Problem:   SVT (supraventricular tachycardia) (HCC) Active Problems:   Cancer of lower-inner quadrant of left female breast ([place])   Hypothyroidism   GERD (gastroesophageal reflux disease)   Coronary artery disease   Anxiety   HLD (hyperlipidemia)   Depression   Chronic back pain   Acute respiratory failure with hypoxia (HCC)   SVT:  EKG showed SVT, givev heart rate is around 140-150, cannot completely rule out atrial flutter. Patient responded to IV Cardizem drip, currently patient is converted to sinus rhythm, with heart rate at 80s. Her palpitation and shortness of breath have resolved. Triggering factors is not clear. Patient states that she had steroid injection for lower back pain, not sure if this is related or not. TSH 0.549 today.  - will admit to SDU as inpt - continue cardizem gtt - continue home metoprolol - aspirin - cycle CE q6 x3 and repeat EKG in the am  - Risk factor stratification: will check FLP and A1C  - 2d echo - please call Card in AM  Acute respiratory failure with hypoxia: Patient had oxygen desaturation to 89% on room air. Patient responded to nasal cannula oxygen quickly. Her oxygen saturation is 96% on 3 L oxygen. I personally removed  her nasal cannula oxygen, she is saturating at 96% on room air. Chest x-rays negative. Patient is on letrozole, which may increase the risk of PE, but currently patient does not have any chest pain or shortness of breath. Her oxygen saturation is normal. No signs of DVT. Low suspicion for PE. It was most likely due to SVT. -Observe closely  Hypothyroidism: Last TSH is 0.549 -Continue home Synthroid  GERD: -Protonix -Pepcid   Cancer of lower-inner quadrant of left female  breast: s/ p of lumpectomy 2014. On letrozole currently. -Continue letrozole  Hx of CAD: no CP. Had SOB which has resolved -Continue aspirin, Lipitor, metoprolol -Follow-up troponin 3 as above  Depression and anxiety: Stable, no suicidal or homicidal ideations. -Continue home medications: Klonopin, Requip, Cymbalta, Wellbutrin  HLD: -continue lipitor  HTN:  -Lisinopril, metoprolol, amlodipine  Chronic back pain: -When necessary oxycodone  Diet controled DM-II: Last A1c 6.7 on 06/11/16, well controled. Patient is not taking meds at home -SSI -Check A1c   DVT ppx: SQ Lovenox Code Status: Full code Family Communication: None at bed side.  Disposition Plan:  Anticipate discharge back to previous home environment Consults called:  none Admission status: SDU/inpation       Date of Service 02/04/2017    Ivor Costa [facility] Pager [phone]  If 7PM-7AM, please contact night-coverage [email or web address] Password [facility] 02/04/2017, 6:19 AM

## 2017-02-04 NOTE — Progress Notes (Addendum)
Patient seen and examined  83 y.o. female with medical history significant of hypertension, hyperlipidemia, diabetes mellitus, GERD, depression, anxiety, as well as, CAD, breast cancer on letrozole (s/p of lumpectomy 2014), chronic back pain, who presents with shortness breath, palpitation. pt was found to have SVT vs. Possible A flutter with HR 146, negative troponin, WBC 4.8, creatinine 1.01, temperature normal, negative chest x-ray. Patient is admitted to stepdown as inpatient.  Assessment and plan SVT: Currently normal sinus rhythm  EKG showed SVT, given heart rate is around 140-150, cannot completely rule out atrial flutter. Patient responded to IV Cardizem drip, currently patient is converted to sinus rhythm, with heart rate at 80s. Her palpitation and shortness of breath have resolved. Triggering factors is not clear. Patient states that she had steroid injection for lower back pain, not sure if this is related or not. TSH 0.549 today.Magnesium 1.7 In sinus rhythm overnight, transferred to telemetry -Discontinue cardizem gtt, start PO Cardizem - continue home metoprolol - aspirin - cycle CE q6 x3 and repeat EKG in the am  - Risk factor stratification: will check FLP and A1C  - 2d echo pending  D-dimerslightly elevated CT PE No evidence of pulmonary embolism.Pulmonary artery enlargement suggests pulmonary arterial hypertension. ,  venous Doppler to rule ou DVT  Acute respiratory failure with hypoxia:  initially hypoxic, now 93% on room air, Chest x-rays negative. Patient is on letrozole, which may increase the risk of PE, but currently patient does not have any chest pain or shortness of breath. Her oxygen saturation is normal. No signs of DVT. Low suspicion for PE. It was most likely due to SVT. -Observe closely  Hypothyroidism: Last TSH is 0.549 -Continue home Synthroid  GERD: -Protonix -Pepcid   Cancer of lower-inner quadrant of left female breast: s/ p of lumpectomy 2014.  On letrozole currently. -Continue letrozole  Hx of CAD: no CP. Had SOB which has resolved -Continue aspirin, Lipitor, metoprolol -Follow-up troponin 3 as above  Depression and anxiety: Stable, no suicidal or homicidal ideations. -Continue home medications: Klonopin, Requip, Cymbalta, Wellbutrin  HLD: -continue lipitor  HTN:  -Lisinopril, metoprolol, amlodipine  Chronic back pain: -When necessary oxycodone  Diet controled DM-II: Last A1c 6.7 on 06/11/16, well controled. Patient is not taking meds at home -SSI -Check A1c

## 2017-02-04 NOTE — Progress Notes (Signed)
Patient received from ED at 0300 in no distress, A/Ox4, vitals stable, pain-free, ambulatory with stand-by assist.  Oriented to unit, telemetry verified, call bell within reach. Will continue to monitor patient.  Randell Patient

## 2017-02-04 NOTE — ED Notes (Signed)
Phlebotomy at bedside at this time.

## 2017-02-05 ENCOUNTER — Inpatient Hospital Stay (HOSPITAL_COMMUNITY): Payer: Medicare Other

## 2017-02-05 DIAGNOSIS — F320 Major depressive disorder, single episode, mild: Secondary | ICD-10-CM

## 2017-02-05 DIAGNOSIS — R0602 Shortness of breath: Secondary | ICD-10-CM

## 2017-02-05 DIAGNOSIS — I4892 Unspecified atrial flutter: Secondary | ICD-10-CM

## 2017-02-05 LAB — ECHOCARDIOGRAM COMPLETE
Height: 60 in
Weight: 2582.4 oz

## 2017-02-05 LAB — GLUCOSE, CAPILLARY
GLUCOSE-CAPILLARY: 141 mg/dL — AB (ref 65–99)
GLUCOSE-CAPILLARY: 133 mg/dL — AB (ref 65–99)
Glucose-Capillary: 181 mg/dL — ABNORMAL HIGH (ref 65–99)

## 2017-02-05 LAB — HEMOGLOBIN A1C
HEMOGLOBIN A1C: 6.4 % — AB (ref 4.8–5.6)
Mean Plasma Glucose: 137 mg/dL

## 2017-02-05 MED ORDER — DILTIAZEM HCL ER COATED BEADS 120 MG PO CP24: 120.0000 mg | ORAL_CAPSULE | Freq: Every day | ORAL | 2 refills | Status: DC

## 2017-02-05 MED ORDER — DILTIAZEM HCL ER COATED BEADS 120 MG PO CP24: 120.0000 mg | ORAL_CAPSULE | Freq: Every day | ORAL | 2 refills | Status: AC

## 2017-02-05 MED ORDER — MAGNESIUM OXIDE 400 (241.3 MG) MG PO TABS: 400.0000 mg | ORAL_TABLET | Freq: Every day | ORAL | 1 refills | Status: AC

## 2017-02-05 MED ORDER — PERFLUTREN LIPID MICROSPHERE
1.0000 mL | INTRAVENOUS | Status: AC | PRN
  Administered 2017-02-05: 2 mL via INTRAVENOUS
  Filled 2017-02-05: qty 10

## 2017-02-05 NOTE — Discharge Summary (Addendum)
Physician Discharge Summary  REAH JUSTO MRN: [id number] DOB/AGE: 01-16-1932 83 y.o.  PCP: Billie Ruddy I, NP   Admit date: 02/03/2017 Discharge date: 02/05/2017  Discharge Diagnoses:    Principal Problem:   SVT (supraventricular tachycardia) ([place]) Active Problems:   Cancer of lower-inner quadrant of left female breast ([place])   Hypothyroidism   GERD (gastroesophageal reflux disease)   Coronary artery disease   Anxiety   HLD (hyperlipidemia)   Depression   Chronic back pain   Acute respiratory failure with hypoxia ([facility])    Follow-up recommendations Follow-up with PCP in 3-5 days , including all  additional recommended appointments as below Follow-up CBC, CMP in 3-5 days Follow up with Dr Wynonia Lawman for new dx of nonsustained  SVT       Current Discharge Medication List    START taking these medications   Details  diltiazem (CARDIZEM CD) 120 MG 24 hr capsule Take 1 capsule (120 mg total) by mouth daily. Qty: 30 capsule, Refills: 2    magnesium oxide (MAG-OX) 400 (241.3 Mg) MG tablet Take 1 tablet (400 mg total) by mouth daily. Qty: 30 tablet, Refills: 1      CONTINUE these medications which have NOT CHANGED   Details  aspirin 81 MG tablet Take 81 mg by mouth every evening.     atorvastatin (LIPITOR) 20 MG tablet Take 20 mg by mouth at bedtime.    buPROPion (WELLBUTRIN SR) 200 MG 12 hr tablet Take 200 mg by mouth daily.    CALCIUM-MAGNESIUM-VITAMIN D PO Take 1 tablet by mouth 2 (two) times daily.    clonazePAM (KLONOPIN) 0.5 MG tablet Take 0.5 mg by mouth at bedtime as needed for anxiety.    DULoxetine (CYMBALTA) 60 MG capsule Take 60 mg by mouth 2 (two) times daily.    famotidine (PEPCID) 20 MG tablet Take 20 mg by mouth at bedtime.     latanoprost (XALATAN) 0.005 % ophthalmic solution Place 1 drop into both eyes at bedtime.    letrozole (FEMARA) 2.5 MG tablet TAKE 1 TABLET DAILY Qty: 90 tablet, Refills: 3    levothyroxine (SYNTHROID, LEVOTHROID) 25  MCG tablet Take 75-100 mcg by mouth See admin instructions. Mon Wed Fri 100 mcg, and Sun Tues Thurs Sat 75 mc    lisinopril (PRINIVIL,ZESTRIL) 20 MG tablet Take 20 mg by mouth daily.    metoprolol succinate (TOPROL-XL) 50 MG 24 hr tablet Take 50 mg by mouth daily. Take with or immediately following a meal.    omeprazole (PRILOSEC) 40 MG capsule Take 40 mg by mouth daily.    rOPINIRole (REQUIP) 0.25 MG tablet Take 0.5 mg by mouth at bedtime.    oxyCODONE (OXY IR/ROXICODONE) 5 MG immediate release tablet Take 1-2 tablets (5-10 mg total) by mouth every 4 (four) hours as needed for moderate pain. Qty: 30 tablet, Refills: 0      STOP taking these medications     amLODipine (NORVASC) 5 MG tablet          Discharge Condition: Stable Discharge Instructions Get Medicines reviewed and adjusted: Please take all your medications with you for your next visit with your Primary MD  Please request your Primary MD to go over all hospital tests and procedure/radiological results at the follow up, please ask your Primary MD to get all Hospital records sent to his/her office.  If you experience worsening of your admission symptoms, develop shortness of breath, life threatening emergency, suicidal or homicidal thoughts you must seek medical attention immediately by  calling 911 or calling your MD immediately if symptoms less severe.  You must read complete instructions/literature along with all the possible adverse reactions/side effects for all the Medicines you take and that have been prescribed to you. Take any new Medicines after you have completely understood and accpet all the possible adverse reactions/side effects.   Do not drive when taking Pain medications.   Do not take more than prescribed Pain, Sleep and Anxiety Medications  Special Instructions: If you have smoked or chewed Tobacco in the last 2 yrs please stop smoking, stop any regular Alcohol and or any Recreational drug  use.  Wear Seat belts while driving.  Please note  You were cared for by a hospitalist during your hospital stay. Once you are discharged, your primary care physician will handle any further medical issues. Please note that NO REFILLS for any discharge medications will be authorized once you are discharged, as it is imperative that you return to your primary care physician (or establish a relationship with a primary care physician if you do not have one) for your aftercare needs so that they can reassess your need for medications and monitor your lab values.     Allergies  Allergen Reactions  . No Known Allergies       Disposition: 04-Intermediate Care Facility   Consults: None    Significant Diagnostic Studies:  Ct Angio Chest Pe W Or Wo Contrast  Result Date: 02/04/2017 CLINICAL DATA:  Progressive shortness of breath. Hypertension. Diabetes. Breast cancer. EXAM: CT ANGIOGRAPHY CHEST WITH CONTRAST TECHNIQUE: Multidetector CT imaging of the chest was performed using the standard protocol during bolus administration of intravenous contrast. Multiplanar CT image reconstructions and MIPs were obtained to evaluate the vascular anatomy. CONTRAST:  80 cc of Isovue 370 COMPARISON:  Plain film 02/03/2017. FINDINGS: Cardiovascular: The quality of this exam for evaluation of pulmonary embolism is could. No evidence of pulmonary embolism. Aortic and branch vessel atherosclerosis. Tortuous thoracic aorta. Moderate cardiomegaly, without pericardial effusion. Pulmonary artery enlargement, outflow tract 3.2 cm. Multivessel coronary artery atherosclerosis. Left main disease identified on image 58/series 5. Mediastinum/Nodes: No mediastinal or hilar adenopathy. No axillary or internal mammary adenopathy. Lungs/Pleura: No pleural fluid.  Clear lungs. Upper Abdomen: Normal imaged portions of the liver, spleen, stomach, adrenal glands, left kidney. Musculoskeletal: Thoracic spondylosis. Right hemidiaphragm  elevation. Review of the MIP images confirms the above findings. IMPRESSION: 1.  No evidence of pulmonary embolism. 2. Pulmonary artery enlargement suggests pulmonary arterial hypertension. 3. Cardiomegaly. Coronary artery atherosclerosis. Aortic atherosclerosis. Electronically Signed   By: Abigail Miyamoto M.D.   On: 02/04/2017 13:59   Dg Chest Portable 1 View  Result Date: 02/03/2017 CLINICAL DATA:  Tachycardia and dyspnea this evening. EXAM: PORTABLE CHEST 1 VIEW COMPARISON:  06/12/2016 FINDINGS: Mild chronic right hemidiaphragm elevation. No airspace consolidation. No large effusion. Normal pulmonary vasculature. Normal hilar and mediastinal contours, unchanged. IMPRESSION: No acute cardiopulmonary findings. Electronically Signed   By: Andreas Newport M.D.   On: 02/03/2017 23:05    echocardiogram       Filed Weights   02/04/17 0300  Weight: 73.2 kg (161 lb 6.4 oz)     Microbiology: No results found for this or any previous visit (from the past 240 hour(s)).     Blood Culture No results found for: SDES, [place], CULT, REPTSTATUS    Labs: Results for orders placed or performed during the hospital encounter of 02/03/17 (from the past 48 hour(s))  CBC with Differential     Status:  None   Collection Time: 02/03/17 10:26 PM  Result Value Ref Range   WBC 4.8 4.0 - 10.5 K/uL   RBC 4.53 3.87 - 5.11 MIL/uL   Hemoglobin 14.1 12.0 - 15.0 g/dL   HCT 41.7 36.0 - 46.0 %   MCV 92.1 78.0 - 100.0 fL   MCH 31.1 26.0 - 34.0 pg   MCHC 33.8 30.0 - 36.0 g/dL   RDW 13.0 11.5 - 15.5 %   Platelets 299 150 - 400 K/uL   Neutrophils Relative % 74 %   Neutro Abs 3.5 1.7 - 7.7 K/uL   Lymphocytes Relative 23 %   Lymphs Abs 1.1 0.7 - 4.0 K/uL   Monocytes Relative 3 %   Monocytes Absolute 0.1 0.1 - 1.0 K/uL   Eosinophils Relative 0 %   Eosinophils Absolute 0.0 0.0 - 0.7 K/uL   Basophils Relative 0 %   Basophils Absolute 0.0 0.0 - 0.1 K/uL  Comprehensive metabolic panel     Status: Abnormal    Collection Time: 02/03/17 10:26 PM  Result Value Ref Range   Sodium 139 135 - 145 mmol/L   Potassium 3.9 3.5 - 5.1 mmol/L   Chloride 106 101 - 111 mmol/L   CO2 25 22 - 32 mmol/L   Glucose, Bld 261 (H) 65 - 99 mg/dL   BUN 14 6 - 20 mg/dL   Creatinine, Ser 1.01 (H) 0.44 - 1.00 mg/dL   Calcium 9.3 8.9 - 10.3 mg/dL   Total Protein 6.3 (L) 6.5 - 8.1 g/dL   Albumin 3.6 3.5 - 5.0 g/dL   AST 28 15 - 41 U/L   ALT 22 14 - 54 U/L   Alkaline Phosphatase 67 38 - 126 U/L   Total Bilirubin 0.5 0.3 - 1.2 mg/dL   GFR calc non Af Amer 50 (L) >60 mL/min   GFR calc Af Amer 58 (L) >60 mL/min    Comment: (NOTE) The eGFR has been calculated using the CKD EPI equation. This calculation has not been validated in all clinical situations. eGFR's persistently <60 mL/min signify possible Chronic Kidney Disease.    Anion gap 8 5 - 15  Magnesium     Status: None   Collection Time: 02/03/17 10:26 PM  Result Value Ref Range   Magnesium 1.7 1.7 - 2.4 mg/dL  I-Stat Troponin, ED (not at [facility])     Status: None   Collection Time: 02/03/17 10:43 PM  Result Value Ref Range   Troponin i, poc 0.01 0.00 - 0.08 ng/mL   Comment 3            Comment: Due to the release kinetics of cTnI, a negative result within the first hours of the onset of symptoms does not rule out myocardial infarction with certainty. If myocardial infarction is still suspected, repeat the test at appropriate intervals.   TSH     Status: None   Collection Time: 02/03/17 10:51 PM  Result Value Ref Range   TSH 0.549 0.350 - 4.500 uIU/mL    Comment: Performed by a 3rd Generation assay with a functional sensitivity of <=0.01 uIU/mL.  Hemoglobin A1c     Status: Abnormal   Collection Time: 02/04/17  2:35 AM  Result Value Ref Range   Hgb A1c MFr Bld 6.4 (H) 4.8 - 5.6 %    Comment: (NOTE)         Pre-diabetes: 5.7 - 6.4         Diabetes: >6.4  Glycemic control for adults with diabetes: <7.0    Mean Plasma Glucose 137 mg/dL    Comment:  (NOTE) Performed At: [facility] [place], [place] [id number] Lindon Romp MD [id number]   Lipid panel     Status: None   Collection Time: 02/04/17  2:35 AM  Result Value Ref Range   Cholesterol 119 0 - 200 mg/dL   Triglycerides 69 <150 mg/dL   HDL 55 >40 mg/dL   Total CHOL/HDL Ratio 2.2 RATIO   VLDL 14 0 - 40 mg/dL   LDL Cholesterol 50 0 - 99 mg/dL    Comment:        Total Cholesterol/HDL:CHD Risk Coronary Heart Disease Risk Table                     Men   Women  1/2 Average Risk   3.4   3.3  Average Risk       5.0   4.4  2 X Average Risk   9.6   7.1  3 X Average Risk  23.4   11.0        Use the calculated Patient Ratio above and the CHD Risk Table to determine the patient's CHD Risk.        ATP III CLASSIFICATION (LDL):  <100     mg/dL   Optimal  100-129  mg/dL   Near or Above                    Optimal  130-159  mg/dL   Borderline  160-189  mg/dL   High  >190     mg/dL   Very High   Troponin I (q 6hr x 3)     Status: None   Collection Time: 02/04/17  2:35 AM  Result Value Ref Range   Troponin I <0.03 <0.03 ng/mL  Glucose, capillary     Status: Abnormal   Collection Time: 02/04/17  6:03 AM  Result Value Ref Range   Glucose-Capillary 156 (H) 65 - 99 mg/dL   Comment 1 Notify RN   CBC     Status: None   Collection Time: 02/04/17  6:46 AM  Result Value Ref Range   WBC 8.4 4.0 - 10.5 K/uL   RBC 4.02 3.87 - 5.11 MIL/uL   Hemoglobin 12.3 12.0 - 15.0 g/dL   HCT 36.8 36.0 - 46.0 %   MCV 91.5 78.0 - 100.0 fL   MCH 30.6 26.0 - 34.0 pg   MCHC 33.4 30.0 - 36.0 g/dL   RDW 13.0 11.5 - 15.5 %   Platelets 286 150 - 400 K/uL  Protime-INR     Status: None   Collection Time: 02/04/17  8:08 AM  Result Value Ref Range   Prothrombin Time 14.1 11.4 - 15.2 seconds   INR 1.09   D-dimer, quantitative (not at [facility])     Status: Abnormal   Collection Time: 02/04/17  8:08 AM  Result Value Ref Range   D-Dimer, Quant 0.51 (H) 0.00 - 0.50 ug/mL-FEU     Comment: (NOTE) At the manufacturer cut-off of 0.50 ug/mL FEU, this assay has been documented to exclude PE with a sensitivity and negative predictive value of 97 to 99%.  At this time, this assay has not been approved by the FDA to exclude DVT/VTE. Results should be correlated with clinical presentation.   Basic metabolic panel     Status: Abnormal   Collection Time: 02/04/17  8:08 AM  Result Value Ref Range   Sodium 140 135 - 145 mmol/L   Potassium 4.0 3.5 - 5.1 mmol/L   Chloride 106 101 - 111 mmol/L   CO2 25 22 - 32 mmol/L   Glucose, Bld 151 (H) 65 - 99 mg/dL   BUN 15 6 - 20 mg/dL   Creatinine, Ser 0.88 0.44 - 1.00 mg/dL   Calcium 9.1 8.9 - 10.3 mg/dL   GFR calc non Af Amer 60 (L) >60 mL/min   GFR calc Af Amer >60 >60 mL/min    Comment: (NOTE) The eGFR has been calculated using the CKD EPI equation. This calculation has not been validated in all clinical situations. eGFR's persistently <60 mL/min signify possible Chronic Kidney Disease.    Anion gap 9 5 - 15  Troponin I     Status: None   Collection Time: 02/04/17  8:08 AM  Result Value Ref Range   Troponin I <0.03 <0.03 ng/mL  Glucose, capillary     Status: Abnormal   Collection Time: 02/04/17 11:32 AM  Result Value Ref Range   Glucose-Capillary 124 (H) 65 - 99 mg/dL  Glucose, capillary     Status: Abnormal   Collection Time: 02/04/17  4:00 PM  Result Value Ref Range   Glucose-Capillary 152 (H) 65 - 99 mg/dL   Comment 1 Notify RN    Comment 2 Document in Chart   Glucose, capillary     Status: Abnormal   Collection Time: 02/04/17  9:16 PM  Result Value Ref Range   Glucose-Capillary 161 (H) 65 - 99 mg/dL   Comment 1 Notify RN    Comment 2 Document in Chart   Glucose, capillary     Status: Abnormal   Collection Time: 02/05/17  6:14 AM  Result Value Ref Range   Glucose-Capillary 141 (H) 65 - 99 mg/dL  Glucose, capillary     Status: Abnormal   Collection Time: 02/05/17 11:14 AM  Result Value Ref Range    Glucose-Capillary 181 (H) 65 - 99 mg/dL   Comment 1 Notify RN    Comment 2 Document in Chart      Lipid Panel     Component Value Date/Time   CHOL 119 02/04/2017 0235   TRIG 69 02/04/2017 0235   HDL 55 02/04/2017 0235   CHOLHDL 2.2 02/04/2017 0235   VLDL 14 02/04/2017 0235   LDLCALC 50 02/04/2017 0235     Lab Results  Component Value Date   HGBA1C 6.4 (H) 02/04/2017   HGBA1C 6.7 (H) 06/11/2016       HPI :   83 y.o.femalewith medical history significant of hypertension, hyperlipidemia, diabetes mellitus, GERD, depression, anxiety, as well as, CAD, breast cancer on letrozole (s/p of lumpectomy 2014), chronic back pain, who presents with shortness breath, palpitation.Pt states that around 6:30 PM this afternoon, she developed acute onset of palpitations, neck pressure, and SOB. She had some mild lightheadedness with this. She tried to wait it out but sx persisted so she presents today.Per EMS, patient was found to have heart rate in the 180s on their arrival. Patient was found to have SVT vs. Possible A flutter with HR 146, negative troponin, WBC 4.8, creatinine 1.01, temperature normal, negative chest x-ray. Patient is admitted for further evaluation  Hospital course SVT resolved: Currently normal sinus rhythm Initially found to have new onset supraventricular tachycardia followed by suspected atrial flutter with 2:1 block. I suspect this is secondary to recent steroid injection EKG showed SVT, givenheart rate is around 140-150,  cannot completely rule out atrial flutter. Patient responded to IV Cardizem drip, currently patient is converted to sinus rhythm, with heart rate at 80s. Her palpitation and shortness of breath have resolved.  . Patient states that she had steroid injection for lower back pain, not sure if this is related or not. TSH 0.549 today.Magnesium 1.7, magnesium repleted Patient remained in normal sinus rhythm Initially was placed on cardizem gtt, subsequently  transitioned to oral Cardizem Patient already on metoprolol at home - aspirin - cycle CE q6 x2 - Risk factor stratification: Lipid panel shows triglycerides 69, LDL 50, hemoglobin A1c 6.4 - 2d echo pending ...................Marland Kitchen D-dimer somewhat elevated, CT PE No evidence of pulmonary embolism.Pulmonary artery enlargement suggests pulmonary arterial hypertension. ,  venous Doppler to rule ou DVT......................Marland Kitchen  Acute respiratory failure with hypoxia: initially hypoxic, now 93% on room air, Chest x-rays negative. Patient is on letrozole, which may increase the risk of PE, but currently patient does not have any chest pain or shortness of breath. Her oxygen saturation is normal. No signs of DVT. Low suspicion for PE. It was most likely due to SVT.    Hypothyroidism: Last TSH is 0.549 -Continue home Synthroid  GERD: -Protonix -Pepcid   Cancer of lower-inner quadrant of left female breast:s/ p of lumpectomy 2014. On letrozole currently. -Continue letrozole  Hx of CAD:no CP. Had SOB which has resolved -Continue aspirin, Lipitor, metoprolol Cardiac enzymes negative 2  Depression and anxiety:Stable, no suicidal or homicidal ideations. -Continue home medications: Klonopin, Requip, Cymbalta, Wellbutrin  HLD: -continue lipitor  HTN: -Lisinopril, metoprolol, discontinued Norvasc and patient started on Cardizem CD  Chronic back pain: -When necessary oxycodone  Diet controled DM-II:Last A1c 6.7 on 06/11/16, well controled. Patient is not taking meds at home Hemoglobin A1c 6.4  Discharge Exam: *  Blood pressure (!) 142/61, pulse (!) 59, temperature 98.3 F (36.8 C), temperature source Oral, resp. rate 18, height 5' (1.524 m), weight 73.2 kg (161 lb 6.4 oz), SpO2 95 %.  Cardiovascular: Regular rhythm, normal heart sounds and intact distal pulses.  Tachycardia present.  Exam reveals no friction rub.   No murmur heard. Pulmonary/Chest: Effort normal and breath  sounds normal. No respiratory distress. She has no wheezes. She has no rales.  Abdominal: She exhibits no distension.  Musculoskeletal: She exhibits no edema.  Neurological: She is alert and oriented to person, place, and time. She exhibits normal muscle tone.  Skin: Skin is warm. Capillary refill takes less than 2 seconds.       SignedReyne Dumas 02/05/2017, 12:31 PM        Time spent >45 mins

## 2017-02-05 NOTE — Progress Notes (Signed)
  Echocardiogram 2D Echocardiogram has been performed.  Wendy Roth 02/05/2017, 3:45 PM

## 2017-02-05 NOTE — Progress Notes (Signed)
02/05/2017 6:39 PM Discharge AVS meds taken today and those due this evening reviewed.  Follow-up appointments and when to call md reviewed.  D/C IV and TELE.  Questions and concerns addressed.   D/C home per orders. [place]

## 2017-02-05 NOTE — Progress Notes (Signed)
*  PRELIMINARY RESULTS* Vascular Ultrasound Bilateral lower extremity venous duplex has been completed.  Preliminary findings: No evidence of deep vein thrombosis or baker's cysts bilaterally.   Everrett Coombe 02/05/2017, 4:56 PM

## 2017-02-07 ENCOUNTER — Encounter (HOSPITAL_BASED_OUTPATIENT_CLINIC_OR_DEPARTMENT_OTHER): Payer: Self-pay | Admitting: Emergency Medicine

## 2017-02-07 ENCOUNTER — Emergency Department (HOSPITAL_BASED_OUTPATIENT_CLINIC_OR_DEPARTMENT_OTHER): Payer: Medicare Other

## 2017-02-07 ENCOUNTER — Observation Stay (HOSPITAL_BASED_OUTPATIENT_CLINIC_OR_DEPARTMENT_OTHER)
Admission: EM | Admit: 2017-02-07 | Discharge: 2017-02-08 | Disposition: A | Discharge status: Home / Self Care | Payer: Medicare Other | Attending: Internal Medicine | Admitting: Internal Medicine

## 2017-02-07 DIAGNOSIS — I471 Supraventricular tachycardia, unspecified: Secondary | ICD-10-CM | POA: Diagnosis present

## 2017-02-07 DIAGNOSIS — G8929 Other chronic pain: Secondary | ICD-10-CM | POA: Diagnosis not present

## 2017-02-07 DIAGNOSIS — Z803 Family history of malignant neoplasm of breast: Secondary | ICD-10-CM | POA: Insufficient documentation

## 2017-02-07 DIAGNOSIS — E785 Hyperlipidemia, unspecified: Secondary | ICD-10-CM | POA: Diagnosis present

## 2017-02-07 DIAGNOSIS — C50312 Malignant neoplasm of lower-inner quadrant of left female breast: Secondary | ICD-10-CM | POA: Diagnosis present

## 2017-02-07 DIAGNOSIS — F329 Major depressive disorder, single episode, unspecified: Secondary | ICD-10-CM | POA: Diagnosis not present

## 2017-02-07 DIAGNOSIS — R0602 Shortness of breath: Secondary | ICD-10-CM | POA: Insufficient documentation

## 2017-02-07 DIAGNOSIS — Z955 Presence of coronary angioplasty implant and graft: Secondary | ICD-10-CM | POA: Diagnosis not present

## 2017-02-07 DIAGNOSIS — I2510 Atherosclerotic heart disease of native coronary artery without angina pectoris: Secondary | ICD-10-CM | POA: Diagnosis not present

## 2017-02-07 DIAGNOSIS — Z79899 Other long term (current) drug therapy: Secondary | ICD-10-CM | POA: Diagnosis not present

## 2017-02-07 DIAGNOSIS — R0902 Hypoxemia: Secondary | ICD-10-CM | POA: Diagnosis present

## 2017-02-07 DIAGNOSIS — G2581 Restless legs syndrome: Secondary | ICD-10-CM | POA: Insufficient documentation

## 2017-02-07 DIAGNOSIS — I10 Essential (primary) hypertension: Secondary | ICD-10-CM | POA: Diagnosis not present

## 2017-02-07 DIAGNOSIS — F32A Depression, unspecified: Secondary | ICD-10-CM | POA: Diagnosis present

## 2017-02-07 DIAGNOSIS — Z79811 Long term (current) use of aromatase inhibitors: Secondary | ICD-10-CM | POA: Insufficient documentation

## 2017-02-07 DIAGNOSIS — Z853 Personal history of malignant neoplasm of breast: Secondary | ICD-10-CM | POA: Diagnosis not present

## 2017-02-07 DIAGNOSIS — E119 Type 2 diabetes mellitus without complications: Secondary | ICD-10-CM | POA: Diagnosis not present

## 2017-02-07 DIAGNOSIS — M549 Dorsalgia, unspecified: Secondary | ICD-10-CM | POA: Diagnosis not present

## 2017-02-07 DIAGNOSIS — E039 Hypothyroidism, unspecified: Secondary | ICD-10-CM | POA: Diagnosis not present

## 2017-02-07 DIAGNOSIS — Z7982 Long term (current) use of aspirin: Secondary | ICD-10-CM | POA: Insufficient documentation

## 2017-02-07 DIAGNOSIS — R8271 Bacteriuria: Secondary | ICD-10-CM | POA: Diagnosis not present

## 2017-02-07 DIAGNOSIS — K219 Gastro-esophageal reflux disease without esophagitis: Secondary | ICD-10-CM | POA: Diagnosis not present

## 2017-02-07 DIAGNOSIS — F419 Anxiety disorder, unspecified: Secondary | ICD-10-CM | POA: Diagnosis present

## 2017-02-07 DIAGNOSIS — J9601 Acute respiratory failure with hypoxia: Principal | ICD-10-CM | POA: Diagnosis present

## 2017-02-07 DIAGNOSIS — Z66 Do not resuscitate: Secondary | ICD-10-CM | POA: Insufficient documentation

## 2017-02-07 DIAGNOSIS — J9801 Acute bronchospasm: Secondary | ICD-10-CM | POA: Diagnosis not present

## 2017-02-07 DIAGNOSIS — R05 Cough: Secondary | ICD-10-CM | POA: Diagnosis present

## 2017-02-07 DIAGNOSIS — H409 Unspecified glaucoma: Secondary | ICD-10-CM | POA: Diagnosis not present

## 2017-02-07 HISTORY — DX: Hypoxemia: R09.02

## 2017-02-07 LAB — URINALYSIS, ROUTINE W REFLEX MICROSCOPIC
Bilirubin Urine: NEGATIVE
GLUCOSE, UA: NEGATIVE mg/dL
Hgb urine dipstick: NEGATIVE
Ketones, ur: NEGATIVE mg/dL
Nitrite: NEGATIVE
PH: 7 (ref 5.0–8.0)
Protein, ur: NEGATIVE mg/dL
SPECIFIC GRAVITY, URINE: 1.02 (ref 1.005–1.030)

## 2017-02-07 LAB — RAPID URINE DRUG SCREEN, HOSP PERFORMED
Amphetamines: NOT DETECTED
BENZODIAZEPINES: NOT DETECTED
Barbiturates: NOT DETECTED
COCAINE: NOT DETECTED
OPIATES: POSITIVE — AB
Tetrahydrocannabinol: NOT DETECTED

## 2017-02-07 LAB — CBC WITH DIFFERENTIAL/PLATELET
BASOS ABS: 0.1 10*3/uL (ref 0.0–0.1)
BASOS PCT: 0 %
EOS ABS: 0.6 10*3/uL (ref 0.0–0.7)
Eosinophils Relative: 5 %
HEMATOCRIT: 40.9 % (ref 36.0–46.0)
HEMOGLOBIN: 14.1 g/dL (ref 12.0–15.0)
Lymphocytes Relative: 17 %
Lymphs Abs: 2 10*3/uL (ref 0.7–4.0)
MCH: 31.8 pg (ref 26.0–34.0)
MCHC: 34.5 g/dL (ref 30.0–36.0)
MCV: 92.1 fL (ref 78.0–100.0)
Monocytes Absolute: 1.2 10*3/uL — ABNORMAL HIGH (ref 0.1–1.0)
Monocytes Relative: 10 %
NEUTROS ABS: 8.3 10*3/uL — AB (ref 1.7–7.7)
NEUTROS PCT: 68 %
Platelets: 279 10*3/uL (ref 150–400)
RBC: 4.44 MIL/uL (ref 3.87–5.11)
RDW: 13.1 % (ref 11.5–15.5)
WBC: 12.1 10*3/uL — ABNORMAL HIGH (ref 4.0–10.5)

## 2017-02-07 LAB — COMPREHENSIVE METABOLIC PANEL
ALT: 32 U/L (ref 14–54)
ANION GAP: 5 (ref 5–15)
AST: 27 U/L (ref 15–41)
Albumin: 3.6 g/dL (ref 3.5–5.0)
Alkaline Phosphatase: 63 U/L (ref 38–126)
BILIRUBIN TOTAL: 1.2 mg/dL (ref 0.3–1.2)
BUN: 19 mg/dL (ref 6–20)
CALCIUM: 8.8 mg/dL — AB (ref 8.9–10.3)
CO2: 27 mmol/L (ref 22–32)
Chloride: 102 mmol/L (ref 101–111)
Creatinine, Ser: 0.85 mg/dL (ref 0.44–1.00)
GFR calc non Af Amer: >60 mL/min (ref >60)
Glucose, Bld: 161 mg/dL — ABNORMAL HIGH (ref 65–99)
POTASSIUM: 3.7 mmol/L (ref 3.5–5.1)
SODIUM: 134 mmol/L — AB (ref 135–145)
TOTAL PROTEIN: 6.2 g/dL — AB (ref 6.5–8.1)

## 2017-02-07 LAB — URINALYSIS, MICROSCOPIC (REFLEX)

## 2017-02-07 LAB — GLUCOSE, CAPILLARY: Glucose-Capillary: 290 mg/dL — ABNORMAL HIGH (ref 65–99)

## 2017-02-07 LAB — INFLUENZA PANEL BY PCR (TYPE A & B)
INFLBPCR: NEGATIVE
Influenza A By PCR: NEGATIVE

## 2017-02-07 LAB — BRAIN NATRIURETIC PEPTIDE: B Natriuretic Peptide: 36.9 pg/mL (ref 0.0–100.0)

## 2017-02-07 LAB — TROPONIN I: Troponin I: <0.03 ng/mL

## 2017-02-07 LAB — ETHANOL: Alcohol, Ethyl (B): <5 mg/dL (ref <5)

## 2017-02-07 MED ORDER — FAMOTIDINE 20 MG PO TABS
20.0000 mg | ORAL_TABLET | Freq: Every day | ORAL | Status: DC
  Administered 2017-02-07: 20 mg via ORAL
  Filled 2017-02-07: qty 1

## 2017-02-07 MED ORDER — LEVOTHYROXINE SODIUM 50 MCG PO TABS
75.0000 ug | ORAL_TABLET | ORAL | Status: DC
  Administered 2017-02-08: 75 ug via ORAL
  Filled 2017-02-07: qty 1

## 2017-02-07 MED ORDER — DULOXETINE HCL 60 MG PO CPEP
60.0000 mg | ORAL_CAPSULE | Freq: Two times a day (BID) | ORAL | Status: DC
  Administered 2017-02-07 – 2017-02-08 (×2): 60 mg via ORAL
  Filled 2017-02-07 (×3): qty 1

## 2017-02-07 MED ORDER — LEVALBUTEROL HCL 0.63 MG/3ML IN NEBU: 0.6300 mg | INHALATION_SOLUTION | Freq: Four times a day (QID) | RESPIRATORY_TRACT | Status: DC | PRN

## 2017-02-07 MED ORDER — ASPIRIN 81 MG PO CHEW
81.0000 mg | CHEWABLE_TABLET | Freq: Every evening | ORAL | Status: DC
  Administered 2017-02-07: 81 mg via ORAL
  Filled 2017-02-07: qty 1

## 2017-02-07 MED ORDER — ORAL CARE MOUTH RINSE
15.0000 mL | Freq: Two times a day (BID) | OROMUCOSAL | Status: DC
  Administered 2017-02-07: 15 mL via OROMUCOSAL

## 2017-02-07 MED ORDER — LEVALBUTEROL HCL 1.25 MG/0.5ML IN NEBU
1.2500 mg | INHALATION_SOLUTION | Freq: Once | RESPIRATORY_TRACT | Status: AC
  Administered 2017-02-07: 1.25 mg via RESPIRATORY_TRACT
  Filled 2017-02-07: qty 0.5

## 2017-02-07 MED ORDER — METHYLPREDNISOLONE SODIUM SUCC 125 MG IJ SOLR
125.0000 mg | Freq: Once | INTRAMUSCULAR | Status: AC
  Administered 2017-02-07: 125 mg via INTRAVENOUS
  Filled 2017-02-07: qty 2

## 2017-02-07 MED ORDER — LEVOTHYROXINE SODIUM 25 MCG PO TABS: 75.0000 ug | ORAL_TABLET | Freq: Every day | ORAL | Status: DC

## 2017-02-07 MED ORDER — ATORVASTATIN CALCIUM 20 MG PO TABS
20.0000 mg | ORAL_TABLET | Freq: Every day | ORAL | Status: DC
  Administered 2017-02-07: 20 mg via ORAL
  Filled 2017-02-07: qty 1

## 2017-02-07 MED ORDER — SODIUM CHLORIDE 0.9% FLUSH: 3.0000 mL | Freq: Two times a day (BID) | INTRAVENOUS | Status: DC

## 2017-02-07 MED ORDER — ENOXAPARIN SODIUM 40 MG/0.4ML ~~LOC~~ SOLN
40.0000 mg | SUBCUTANEOUS | Status: DC
  Administered 2017-02-07: 40 mg via SUBCUTANEOUS
  Filled 2017-02-07: qty 0.4

## 2017-02-07 MED ORDER — LEVOTHYROXINE SODIUM 50 MCG PO TABS: 75.0000 ug | ORAL_TABLET | ORAL | Status: DC

## 2017-02-07 MED ORDER — OXYCODONE HCL 5 MG PO TABS: 5.0000 mg | ORAL_TABLET | ORAL | Status: DC | PRN

## 2017-02-07 MED ORDER — PANTOPRAZOLE SODIUM 40 MG PO TBEC
40.0000 mg | DELAYED_RELEASE_TABLET | Freq: Every day | ORAL | Status: DC
  Administered 2017-02-07 – 2017-02-08 (×2): 40 mg via ORAL
  Filled 2017-02-07 (×2): qty 1

## 2017-02-07 MED ORDER — LETROZOLE 2.5 MG PO TABS
2.5000 mg | ORAL_TABLET | Freq: Every day | ORAL | Status: DC
  Administered 2017-02-07 – 2017-02-08 (×2): 2.5 mg via ORAL
  Filled 2017-02-07 (×2): qty 1

## 2017-02-07 MED ORDER — LEVOTHYROXINE SODIUM 100 MCG PO TABS: 100.0000 ug | ORAL_TABLET | ORAL | Status: DC

## 2017-02-07 MED ORDER — MAGNESIUM OXIDE 400 (241.3 MG) MG PO TABS
400.0000 mg | ORAL_TABLET | Freq: Every day | ORAL | Status: DC
  Administered 2017-02-07 – 2017-02-08 (×2): 400 mg via ORAL
  Filled 2017-02-07 (×2): qty 1

## 2017-02-07 MED ORDER — CLONAZEPAM 0.5 MG PO TABS: 0.5000 mg | ORAL_TABLET | Freq: Every evening | ORAL | Status: DC | PRN

## 2017-02-07 MED ORDER — LEVALBUTEROL HCL 1.25 MG/0.5ML IN NEBU
1.2500 mg | INHALATION_SOLUTION | Freq: Three times a day (TID) | RESPIRATORY_TRACT | Status: DC
  Administered 2017-02-07: 1.25 mg via RESPIRATORY_TRACT
  Filled 2017-02-07 (×2): qty 0.5

## 2017-02-07 MED ORDER — METOPROLOL SUCCINATE ER 50 MG PO TB24
50.0000 mg | ORAL_TABLET | Freq: Every day | ORAL | Status: DC
  Administered 2017-02-07 – 2017-02-08 (×2): 50 mg via ORAL
  Filled 2017-02-07 (×2): qty 1

## 2017-02-07 MED ORDER — ROPINIROLE HCL 1 MG PO TABS
0.5000 mg | ORAL_TABLET | Freq: Every day | ORAL | Status: DC
  Administered 2017-02-07: 0.5 mg via ORAL

## 2017-02-07 MED ORDER — LISINOPRIL 20 MG PO TABS
20.0000 mg | ORAL_TABLET | Freq: Every day | ORAL | Status: DC
Start: 2017-02-07 — End: 2017-02-08
  Administered 2017-02-07: 20 mg via ORAL
  Filled 2017-02-07: qty 1

## 2017-02-07 MED ORDER — DILTIAZEM HCL ER COATED BEADS 120 MG PO CP24
120.0000 mg | ORAL_CAPSULE | Freq: Every day | ORAL | Status: DC
  Administered 2017-02-07 – 2017-02-08 (×2): 120 mg via ORAL
  Filled 2017-02-07 (×2): qty 1

## 2017-02-07 MED ORDER — LATANOPROST 0.005 % OP SOLN
1.0000 [drp] | Freq: Every day | OPHTHALMIC | Status: DC
  Administered 2017-02-07: 1 [drp] via OPHTHALMIC
  Filled 2017-02-07: qty 2.5

## 2017-02-07 MED ORDER — IPRATROPIUM BROMIDE 0.02 % IN SOLN
0.5000 mg | Freq: Three times a day (TID) | RESPIRATORY_TRACT | Status: DC
  Administered 2017-02-07: 0.5 mg via RESPIRATORY_TRACT
  Filled 2017-02-07 (×2): qty 2.5

## 2017-02-07 MED ORDER — BUPROPION HCL ER (SR) 100 MG PO TB12
200.0000 mg | ORAL_TABLET | Freq: Every day | ORAL | Status: DC
  Administered 2017-02-07 – 2017-02-08 (×2): 200 mg via ORAL
  Filled 2017-02-07 (×2): qty 2

## 2017-02-07 MED ORDER — SODIUM CHLORIDE 0.9 % IV BOLUS (SEPSIS)
500.0000 mL | Freq: Once | INTRAVENOUS | Status: AC
  Administered 2017-02-07: 500 mL via INTRAVENOUS

## 2017-02-07 NOTE — H&P (Signed)
History and Physical    Wendy Roth PNT:[id number] DOB: 1932/09/09 DOA: 02/07/2017  I have briefly reviewed the patient's prior medical records in [place]  PCP: Billie Ruddy I, NP  Patient coming from: home  Chief Complaint: Cough,  HPI: Wendy Roth is a 83 y.o. female with medical history significant of hypertension, hyperlipidemia, diabetes, GERD, depression, anxiety, breast cancer in remission, who presents to the emergency room with chief complaint of cough and shortness of breath.  She was recently hospitalized and discharged 5/3 from [facility] for SVT, resolved, acute respiratory failure which is also resolved.  She underwent a full workup therefore her hypoxia with a CT angiogram on 02/04/2017 which was negative for PE, as well as a 2D echo which showed an ejection fraction of 08-67%, grade 1 diastolic dysfunction. Patient tells me that the day she went home, she felt a little bit of a scratchy throat, however did not pay much attention to it.  After she got home, that evening and yesterday, she started having significant cough and overnight last night was almost unable to sleep due to continuous coughing.  She reports no shortness of breath, no chest pain, no palpitations.  She denies any fever or chills.  She denies any abdominal pain, nausea, vomiting or diarrhea  ED Course: In the ED she is afebrile, heart rate is normal, her blood pressure is within normal limits as well.  She was found to be desaturating into the low 80s on room air requiring supplemental oxygen.  She was also found to be wheezing requiring breathing treatments.  Her blood work is essentially unremarkable except for mildly elevated white count.  TRH was asked for admission for hypoxic respiratory failure  Review of Systems: As per HPI otherwise 10 point review of systems negative.   Past Medical History:  Diagnosis Date  . Anginal pain ([place])   . Anxiety   . Arthritis   . Back  pain   . Breast cancer ([place])   . Carpal tunnel syndrome   . Coronary artery disease 2005   stent placed  . Depression   . Diabetes mellitus without complication (HCC)    diet controlled.   . Fatigue   . GERD (gastroesophageal reflux disease)    "years ago" but still takes Prilosec  . Glaucoma    left eye  . History of kidney stones   . Hypertension   . Hypothyroidism   . Restless leg syndrome   . Seizures ([place])    as a baby    Past Surgical History:  Procedure Laterality Date  . ABDOMINAL HYSTERECTOMY    . ANGIOPLASTY     x2  . BREAST LUMPECTOMY     left x2  . BREAST LUMPECTOMY WITH NEEDLE LOCALIZATION Right 07/05/2013   Procedure: RIGHT BREAST WIRE GUIDED LUMPECTOMY ;  Surgeon: Rolm Bookbinder, MD;  Location: [place];  Service: General;  Laterality: Right;  . CARPAL TUNNEL RELEASE     bilateral  . COLONOSCOPY    . CORONARY ANGIOPLASTY  2005   stent  . EYE SURGERY Bilateral    cataracts  . HERNIA REPAIR    . INSERTION OF MESH N/A 06/11/2016   Procedure: INSERTION OF MESH;  Surgeon: Rolm Bookbinder, MD;  Location: [place];  Service: General;  Laterality: N/A;  . LAPAROSCOPIC INCISIONAL / UMBILICAL / [place]  06/11/2016   UHR w/mesh  . MENISCUS REPAIR     left  . PARATHYROIDECTOMY    .  RE-EXCISION OF BREAST CANCER,SUPERIOR MARGINS Right 08/01/2013   Procedure: RE-EXCISION OF BREAST CANCER MARGIN;  Surgeon: Rolm Bookbinder, MD;  Location: [place];  Service: General;  Laterality: Right;  . TONSILLECTOMY    . UMBILICAL HERNIA REPAIR N/A 06/11/2016   Procedure: LAPAROSCOPIC UMBILICAL HERNIA;  Surgeon: Rolm Bookbinder, MD;  Location: [place];  Service: General;  Laterality: N/A;     reports that she has never smoked. She has never used smokeless tobacco. She reports that she drinks alcohol. She reports that she does not use drugs.  Allergies  Allergen Reactions  . No Known Allergies     Family History  Problem Relation Age of Onset  . Cancer Mother 66     lung cancer   . Cancer Father 74    possible breast cancer  . Cancer Sister     lung cancer ? primary; paternal half sister  . Breast cancer Maternal Aunt 70    ? bilateral  . Cancer Maternal Grandmother 79    prostate cancer    Prior to Admission medications   Medication Sig Start Date End Date Taking? Authorizing Provider  aspirin 81 MG tablet Take 81 mg by mouth every evening.    Yes [provider]  atorvastatin (LIPITOR) 20 MG tablet Take 20 mg by mouth at bedtime.   Yes [provider]  buPROPion (WELLBUTRIN SR) 200 MG 12 hr tablet Take 200 mg by mouth daily.   Yes [provider]  CALCIUM-MAGNESIUM-VITAMIN D PO Take 1 tablet by mouth 2 (two) times daily.   Yes [provider]  clonazePAM (KLONOPIN) 0.5 MG tablet Take 0.5 mg by mouth at bedtime as needed for anxiety.   Yes [provider]  diltiazem (CARDIZEM CD) 120 MG 24 hr capsule Take 1 capsule (120 mg total) by mouth daily. 02/05/17  Yes Reyne Dumas, MD  DULoxetine (CYMBALTA) 60 MG capsule Take 60 mg by mouth 2 (two) times daily.   Yes [provider]  famotidine (PEPCID) 20 MG tablet Take 20 mg by mouth at bedtime.    Yes [provider]  latanoprost (XALATAN) 0.005 % ophthalmic solution Place 1 drop into both eyes at bedtime.   Yes [provider]  letrozole (FEMARA) 2.5 MG tablet TAKE 1 TABLET DAILY 06/17/16  Yes Nicholas Lose, MD  levothyroxine (SYNTHROID, LEVOTHROID) 25 MCG tablet Take 75-100 mcg by mouth See admin instructions. Mon Wed Fri 100 mcg, and Sun Tues Thurs Sat 75 mc   Yes [provider]  lisinopril (PRINIVIL,ZESTRIL) 20 MG tablet Take 20 mg by mouth daily.   Yes [provider]  magnesium oxide (MAG-OX) 400 (241.3 Mg) MG tablet Take 1 tablet (400 mg total) by mouth daily. 02/06/17  Yes Reyne Dumas, MD  metoprolol succinate (TOPROL-XL) 50 MG 24 hr tablet Take 50 mg by mouth daily. Take with or immediately following a meal.    Yes [provider]  omeprazole (PRILOSEC) 40 MG capsule Take 40 mg by mouth daily.   Yes [provider]  oxyCODONE (OXY IR/ROXICODONE) 5 MG immediate release tablet Take 1-2 tablets (5-10 mg total) by mouth every 4 (four) hours as needed for moderate pain. 06/12/16  Yes Rolm Bookbinder, MD  rOPINIRole (REQUIP) 0.25 MG tablet Take 0.5 mg by mouth at bedtime.   Yes [provider]    Physical Exam: Vitals:   02/07/17 1100 02/07/17 1139 02/07/17 1200 02/07/17 1212  BP: 131/70 137/77 (!) 144/83   Pulse: 79 80 79  Resp: 20 18 (!) 21   Temp:    98.7 F (37.1 C)  TempSrc:    Oral  SpO2: 92% 96% 95%   Weight:      Height:        Constitutional: NAD, calm, comfortable Eyes: lids and conjunctivae normal ENMT: Mucous membranes are moist. Posterior pharynx clear of any exudate or lesions. Neck: normal, supple Respiratory: clear to auscultation bilaterally, no wheezing, no crackles. Normal respiratory effort.  Cardiovascular: Regular rate and rhythm, no murmurs / rubs / gallops. No extremity edema. 2+ pedal pulses.  Abdomen: no tenderness, no masses palpated. Bowel sounds positive.  Musculoskeletal: no clubbing / cyanosis. Normal muscle tone.  Skin: no rashes, lesions, ulcers. No induration Neurologic: CN 2-12 grossly intact. Strength 5/5 in all 4.  Psychiatric: Normal judgment and insight. Alert and oriented x 3. Normal mood.   Labs on Admission: I have personally reviewed following labs and imaging studies  CBC:  Recent Labs Lab 02/03/17 2226 02/04/17 0646 02/07/17 0930  WBC 4.8 8.4 12.1*  NEUTROABS 3.5  --  8.3*  HGB 14.1 12.3 14.1  HCT 41.7 36.8 40.9  MCV 92.1 91.5 92.1  PLT 299 286 573   Basic Metabolic Panel:  Recent Labs Lab 02/03/17 2226 02/04/17 0808 02/07/17 0930  NA 139 140 134*  K 3.9 4.0 3.7  CL 106 106 102  CO2 25 25 27   GLUCOSE 261* 151* 161*  BUN 14 15 19   CREATININE 1.01* 0.88 0.85  CALCIUM 9.3 9.1 8.8*  MG 1.7  --   --     GFR: Estimated Creatinine Clearance: 45.5 mL/min (by C-G formula based on SCr of 0.85 mg/dL). Liver Function Tests:  Recent Labs Lab 02/03/17 2226 02/07/17 0930  AST 28 27  ALT 22 32  ALKPHOS 67 63  BILITOT 0.5 1.2  PROT 6.3* 6.2*  ALBUMIN 3.6 3.6   No results for input(s): LIPASE, AMYLASE in the last 168 hours. No results for input(s): AMMONIA in the last 168 hours. Coagulation Profile:  Recent Labs Lab 02/04/17 0808  INR 1.09   Cardiac Enzymes:  Recent Labs Lab 02/04/17 0235 02/04/17 0808 02/07/17 0930  TROPONINI <0.03 <0.03 <0.03   BNP (last 3 results) No results for input(s): PROBNP in the last 8760 hours. HbA1C: No results for input(s): HGBA1C in the last 72 hours. CBG:  Recent Labs Lab 02/04/17 1600 02/04/17 2116 02/05/17 0614 02/05/17 1114 02/05/17 1609  GLUCAP 152* 161* 141* 181* 133*   Lipid Profile: No results for input(s): CHOL, HDL, LDLCALC, TRIG, CHOLHDL, LDLDIRECT in the last 72 hours. Thyroid Function Tests: No results for input(s): TSH, T4TOTAL, FREET4, T3FREE, THYROIDAB in the last 72 hours. Anemia Panel: No results for input(s): VITAMINB12, FOLATE, FERRITIN, TIBC, IRON, RETICCTPCT in the last 72 hours. Urine analysis:    Component Value Date/Time   COLORURINE YELLOW 02/07/2017 0933   APPEARANCEUR CLEAR 02/07/2017 0933   LABSPEC 1.020 02/07/2017 0933   PHURINE 7.0 02/07/2017 0933   GLUCOSEU NEGATIVE 02/07/2017 0933   HGBUR NEGATIVE 02/07/2017 0933   BILIRUBINUR NEGATIVE 02/07/2017 0933   KETONESUR NEGATIVE 02/07/2017 0933   PROTEINUR NEGATIVE 02/07/2017 0933   NITRITE NEGATIVE 02/07/2017 0933   LEUKOCYTESUR TRACE (A) 02/07/2017 0933     Radiological Exams on Admission: Dg Chest Port 1 View  Result Date: 02/07/2017 CLINICAL DATA:  Cough EXAM: PORTABLE CHEST 1 VIEW COMPARISON:  02/04/2017 FINDINGS: The heart size and mediastinal contours are within normal limits. Both lungs are clear. The visualized skeletal structures  are  unremarkable. IMPRESSION: No active disease. Electronically Signed   By: Kerby Moors M.D.   On: 02/07/2017 10:13    EKG: Independently reviewed. Sinus rhythm  Assessment/Plan Active Problems:   Cancer of lower-inner quadrant of left female breast (HCC)   SVT (supraventricular tachycardia) (HCC)   Hypothyroidism   GERD (gastroesophageal reflux disease)   Coronary artery disease   Anxiety   HLD (hyperlipidemia)   Depression   Chronic back pain   Acute respiratory failure with hypoxia (HCC)   Hypoxia   Acute respiratory failure with hypoxia -She also had the same problem last that she was hospitalized, and she underwent workup, with 2D echo as well as CT angiogram which showed normal ejection fraction, and no PE respectively.  She also had her lower extremity DVT ultrasound which was negative. -Patient appears quite comfortable on 2 L nasal cannula, will wean off oxygen as tolerated. -EDP reported significant wheezing, however she has no wheezing on exam.  She denies shortness of breath. -It appears that she has perhaps an upper respiratory viral infection given scratchy throat, and potentially had brief bronchospasm.  Will rule out influenza, continue breathing treatments and wean off oxygen.  May be able to go home tomorrow  SVT during her prior hospital stay -currently she is in sinus rhythm, continue home medications  Hypothyroidism -Continue home Synthroid  GERD -Continue home medications  Prior history of breast cancer -Status post lumpectomy 2014, continue letrozole  History of coronary artery disease -No chest pain, continue aspirin, Lipitor and metoprolol -We will cycle cardiac enzymes given shortness of breath  Depression anxiety -Continue home medications  Hyperlipidemia -Continue Lipitor  Chronic back pain Continue oxycodone  Diet controlled diabetes mellitus -Most recent A1c 6.4   DVT prophylaxis: Lovenox  Code Status: DNR  Family Communication:  no family at bedside Disposition Plan: admit to telemetry, home when ready 24-48h Consults called: none     Admission status: observation   At the point of initial evaluation, it is my clinical opinion that admission for OBSERVATION is reasonable and necessary because the patient's presenting complaints in the context of their chronic conditions represent sufficient risk of deterioration or significant morbidity to constitute reasonable grounds for close observation in the hospital setting, but that the patient may be medically stable for discharge from the hospital within 24 to 48 hours.    Marzetta Board, MD [facility] Pager [phone]  If 7PM-7AM, please contact night-coverage [email or web address] Password [facility]  02/07/2017, 12:38 PM

## 2017-02-07 NOTE — ED Provider Notes (Signed)
[facility] DEPT Provider Note   CSN: [id number] Arrival date & time: 02/07/17  3893     History   Chief Complaint Chief Complaint  Patient presents with  . Cough    HPI Wendy Roth is a 83 y.o. female.  HPI Patient was discharged from hospital yesterday after being admitted for atrial flutter with RVR. She states she developed shortness of breath and coughing overnight. Her cough is nonproductive. Denies any fever or chills. Denies chest pain. Received a steroid injection for chronic lumbar pain yesterday. Patient is drowsy stating that she did not sleep much last night due to cough. Denies orthopnea. No new lower extremity swelling or pain. No history of asthma or COPD. Past Medical History:  Diagnosis Date  . Anginal pain ([place])   . Anxiety   . Arthritis   . Back pain   . Breast cancer ([place])   . Carpal tunnel syndrome   . Coronary artery disease 2005   stent placed  . Depression   . Diabetes mellitus without complication (HCC)    diet controlled.   . Fatigue   . GERD (gastroesophageal reflux disease)    "years ago" but still takes Prilosec  . Glaucoma    left eye  . History of kidney stones   . Hypertension   . Hypothyroidism   . Restless leg syndrome   . Seizures ([place])    as a baby    Patient Active Problem List   Diagnosis Date Noted  . Hypoxia 02/07/2017  . SVT (supraventricular tachycardia) ([place]) 02/04/2017  . HLD (hyperlipidemia) 02/04/2017  . Depression 02/04/2017  . Chronic back pain 02/04/2017  . Acute respiratory failure with hypoxia ([place]) 02/04/2017  . Hypothyroidism   . GERD (gastroesophageal reflux disease)   . Anxiety   . Umbilical hernia 73/42/8768  . Family history of breast cancer 02/18/2013  . Family history of malignant neoplasm of female breast 02/18/2013  . Cancer of lower-inner quadrant of left female breast ([place]) 10/18/2012  . Coronary artery disease 10/07/2003    Past Surgical History:  Procedure Laterality Date  .  ABDOMINAL HYSTERECTOMY    . ANGIOPLASTY     x2  . BREAST LUMPECTOMY     left x2  . BREAST LUMPECTOMY WITH NEEDLE LOCALIZATION Right 07/05/2013   Procedure: RIGHT BREAST WIRE GUIDED LUMPECTOMY ;  Surgeon: Rolm Bookbinder, MD;  Location: [facility];  Service: General;  Laterality: Right;  . CARPAL TUNNEL RELEASE     bilateral  . COLONOSCOPY    . CORONARY ANGIOPLASTY  2005   stent  . EYE SURGERY Bilateral    cataracts  . HERNIA REPAIR    . INSERTION OF MESH N/A 06/11/2016   Procedure: INSERTION OF MESH;  Surgeon: Rolm Bookbinder, MD;  Location: [facility];  Service: General;  Laterality: N/A;  . LAPAROSCOPIC INCISIONAL / UMBILICAL / [place]  06/11/2016   UHR w/mesh  . MENISCUS REPAIR     left  . PARATHYROIDECTOMY    . RE-EXCISION OF BREAST CANCER,SUPERIOR MARGINS Right 08/01/2013   Procedure: RE-EXCISION OF BREAST CANCER MARGIN;  Surgeon: Rolm Bookbinder, MD;  Location: [facility];  Service: General;  Laterality: Right;  . TONSILLECTOMY    . UMBILICAL HERNIA REPAIR N/A 06/11/2016   Procedure: LAPAROSCOPIC UMBILICAL HERNIA;  Surgeon: Rolm Bookbinder, MD;  Location: [facility];  Service: General;  Laterality: N/A;    OB History    No data available       Home Medications  Prior to Admission medications   Medication Sig Start Date End Date Taking? Authorizing Provider  aspirin 81 MG tablet Take 81 mg by mouth every evening.    Yes [provider]  atorvastatin (LIPITOR) 20 MG tablet Take 20 mg by mouth at bedtime.   Yes [provider]  buPROPion (WELLBUTRIN SR) 200 MG 12 hr tablet Take 200 mg by mouth daily.   Yes [provider]  CALCIUM-MAGNESIUM-VITAMIN D PO Take 1 tablet by mouth 2 (two) times daily.   Yes [provider]  clonazePAM (KLONOPIN) 0.5 MG tablet Take 0.5 mg by mouth at bedtime as needed for anxiety.   Yes [provider]  diltiazem (CARDIZEM CD) 120 MG 24 hr capsule Take 1 capsule (120 mg total) by mouth daily. 02/05/17   Yes Reyne Dumas, MD  DULoxetine (CYMBALTA) 60 MG capsule Take 60 mg by mouth 2 (two) times daily.   Yes [provider]  famotidine (PEPCID) 20 MG tablet Take 20 mg by mouth at bedtime.    Yes [provider]  latanoprost (XALATAN) 0.005 % ophthalmic solution Place 1 drop into both eyes at bedtime.   Yes [provider]  letrozole (FEMARA) 2.5 MG tablet TAKE 1 TABLET DAILY 06/17/16  Yes Nicholas Lose, MD  levothyroxine (SYNTHROID, LEVOTHROID) 25 MCG tablet Take 75-100 mcg by mouth See admin instructions. Mon Wed Fri 100 mcg, and Sun Tues Thurs Sat 75 mc   Yes [provider]  lisinopril (PRINIVIL,ZESTRIL) 20 MG tablet Take 20 mg by mouth daily.   Yes [provider]  magnesium oxide (MAG-OX) 400 (241.3 Mg) MG tablet Take 1 tablet (400 mg total) by mouth daily. 02/06/17  Yes Reyne Dumas, MD  metoprolol succinate (TOPROL-XL) 50 MG 24 hr tablet Take 50 mg by mouth daily. Take with or immediately following a meal.   Yes [provider]  omeprazole (PRILOSEC) 40 MG capsule Take 40 mg by mouth daily.   Yes [provider]  oxyCODONE (OXY IR/ROXICODONE) 5 MG immediate release tablet Take 1-2 tablets (5-10 mg total) by mouth every 4 (four) hours as needed for moderate pain. 06/12/16  Yes Rolm Bookbinder, MD  rOPINIRole (REQUIP) 0.25 MG tablet Take 0.5 mg by mouth at bedtime.   Yes [provider]  doxycycline (VIBRA-TABS) 100 MG tablet Take 1 tablet (100 mg total) by mouth 2 (two) times daily. 02/08/17 02/13/17  Florencia Reasons, MD  fluticasone (FLONASE) 50 MCG/ACT nasal spray Place 1 spray into both nostrils daily. 02/08/17   Florencia Reasons, MD  guaiFENesin (MUCINEX) 600 MG 12 hr tablet Take 1 tablet (600 mg total) by mouth 2 (two) times daily. 02/08/17 02/18/17  Florencia Reasons, MD    Family History Family History  Problem Relation Age of Onset  . Cancer Mother 39    lung cancer   . Cancer Father 26    possible breast cancer  . Cancer Sister     lung  cancer ? primary; paternal half sister  . Breast cancer Maternal Aunt 70    ? bilateral  . Cancer Maternal Grandmother 79    prostate cancer    Social History Social History  Substance Use Topics  . Smoking status: Never Smoker  . Smokeless tobacco: Never Used  . Alcohol use Yes     Comment: socially     Allergies   No known allergies   Review of Systems Review of Systems  Constitutional: Negative for chills and fever.  HENT: Negative for congestion, sinus  pressure and sore throat.   Respiratory: Positive for cough, shortness of breath and wheezing.   Cardiovascular: Negative for chest pain, palpitations and leg swelling.  Gastrointestinal: Negative for abdominal pain, constipation, diarrhea, nausea and vomiting.  Genitourinary: Negative for dysuria, flank pain and frequency.  Musculoskeletal: Positive for back pain. Negative for myalgias and neck pain.  Skin: Negative for rash and wound.  Neurological: Negative for dizziness, weakness, light-headedness, numbness and headaches.  All other systems reviewed and are negative.    Physical Exam Updated Vital Signs BP 137/60 (BP Location: Right Arm)   Pulse 65   Temp 98.4 F (36.9 C) (Oral)   Resp 19   Ht 5' (1.524 m)   Wt 158 lb 15.2 oz (72.1 kg)   SpO2 93%   BMI 31.04 kg/m   Physical Exam  Constitutional: She is oriented to person, place, and time. She appears well-developed and well-nourished.  Drowsy. Sitting on stretcher with eyes closed.  HENT:  Head: Normocephalic and atraumatic.  Mouth/Throat: Oropharynx is clear and moist. No oropharyngeal exudate.  Eyes: EOM are normal. Pupils are equal, round, and reactive to light.  Neck: Normal range of motion. Neck supple.  Meningismus. No appreciated stridor  Cardiovascular: Normal rate and regular rhythm.  Exam reveals no gallop and no friction rub.   No murmur heard. Pulmonary/Chest: Effort normal.  Diminished air movement throughout. End expiratory wheezing.    Abdominal: Soft. Bowel sounds are normal. There is no tenderness. There is no rebound and no guarding.  Musculoskeletal: Normal range of motion. She exhibits no edema or tenderness.  No lower extremity tenderness, asymmetry. Mild bilateral 1+ pitting edema. Distal pulses are 2+. No midline thoracic tenderness. No CVA tenderness.  Neurological: She is oriented to person, place, and time.  Moving all extremities without any focal deficit. Sensation intact. Patient is drowsy but responds to voice.  Skin: Skin is warm and dry. Capillary refill takes less than 2 seconds. No rash noted. No erythema.  Nursing note and vitals reviewed.    ED Treatments / Results  Labs (all labs ordered are listed, but only abnormal results are displayed) Labs Reviewed  CBC WITH DIFFERENTIAL/PLATELET - Abnormal; Notable for the following:       Result Value   WBC 12.1 (*)    Neutro Abs 8.3 (*)    Monocytes Absolute 1.2 (*)    All other components within normal limits  COMPREHENSIVE METABOLIC PANEL - Abnormal; Notable for the following:    Sodium 134 (*)    Glucose, Bld 161 (*)    Calcium 8.8 (*)    Total Protein 6.2 (*)    All other components within normal limits  RAPID URINE DRUG SCREEN, HOSP PERFORMED - Abnormal; Notable for the following:    Opiates POSITIVE (*)    All other components within normal limits  URINALYSIS, ROUTINE W REFLEX MICROSCOPIC - Abnormal; Notable for the following:    Leukocytes, UA TRACE (*)    All other components within normal limits  URINALYSIS, MICROSCOPIC (REFLEX) - Abnormal; Notable for the following:    Bacteria, UA MANY (*)    Squamous Epithelial / LPF 0-5 (*)    All other components within normal limits  GLUCOSE, CAPILLARY - Abnormal; Notable for the following:    Glucose-Capillary 290 (*)    All other components within normal limits  COMPREHENSIVE METABOLIC PANEL - Abnormal; Notable for the following:    Glucose, Bld 261 (*)    BUN 25 (*)    Creatinine,  Ser 1.12  (*)    Calcium 8.8 (*)    Total Protein 6.0 (*)    Albumin 3.3 (*)    GFR calc non Af Amer 44 (*)    GFR calc Af Amer 52 (*)    All other components within normal limits  URINE CULTURE  BRAIN NATRIURETIC PEPTIDE  ETHANOL  TROPONIN I  INFLUENZA PANEL BY PCR (TYPE A & B)  CBC    EKG  EKG Interpretation  Date/Time:  Saturday Feb 07 2017 09:32:20 EDT Ventricular Rate:  79 PR Interval:    QRS Duration: 93 QT Interval:  394 QTC Calculation: 452 R Axis:   40 Text Interpretation:  Sinus rhythm Borderline T wave abnormalities Confirmed by Lita Mains  MD, Clayborn Milnes ([id number]) on 02/07/2017 9:38:53 AM Also confirmed by Lita Mains  MD, Anabella Capshaw ([id number]), editor Drema Pry [phone])  on 02/07/2017 10:58:27 AM       Radiology Dg Chest Port 1 View  Result Date: 02/07/2017 CLINICAL DATA:  Cough EXAM: PORTABLE CHEST 1 VIEW COMPARISON:  02/04/2017 FINDINGS: The heart size and mediastinal contours are within normal limits. Both lungs are clear. The visualized skeletal structures are unremarkable. IMPRESSION: No active disease. Electronically Signed   By: Kerby Moors M.D.   On: 02/07/2017 10:13    Procedures Procedures (including critical care time)  Medications Ordered in ED Medications  levalbuterol (XOPENEX) nebulizer solution 0.63 mg (not administered)  diltiazem (CARDIZEM CD) 24 hr capsule 120 mg (120 mg Oral Given 02/07/17 1742)  magnesium oxide (MAG-OX) tablet 400 mg (400 mg Oral Given 02/07/17 1742)  letrozole [facility]) tablet 2.5 mg (2.5 mg Oral Given 02/07/17 1743)  atorvastatin (LIPITOR) tablet 20 mg (20 mg Oral Given 02/07/17 2128)  latanoprost (XALATAN) 0.005 % ophthalmic solution 1 drop (1 drop Both Eyes Given 02/07/17 2128)  pantoprazole (PROTONIX) EC tablet 40 mg (40 mg Oral Given 02/07/17 1742)  oxyCODONE (Oxy IR/ROXICODONE) immediate release tablet 5-10 mg (not administered)  buPROPion (WELLBUTRIN SR) 12 hr tablet 200 mg (200 mg Oral Given 02/07/17 1741)  lisinopril (PRINIVIL,ZESTRIL)  tablet 20 mg (20 mg Oral Given 02/07/17 2128)  DULoxetine (CYMBALTA) DR capsule 60 mg (60 mg Oral Given 02/07/17 1742)  famotidine (PEPCID) tablet 20 mg (20 mg Oral Given 02/07/17 2128)  rOPINIRole (REQUIP) tablet 0.5 mg (0.5 mg Oral Given 02/07/17 2128)  clonazePAM (KLONOPIN) tablet 0.5 mg (not administered)  aspirin chewable tablet 81 mg (81 mg Oral Given 02/07/17 1742)  metoprolol succinate (TOPROL-XL) 24 hr tablet 50 mg (50 mg Oral Given 02/07/17 1742)  enoxaparin (LOVENOX) injection 40 mg (40 mg Subcutaneous Given 02/07/17 1746)  sodium chloride flush (NS) 0.9 % injection 3 mL (3 mLs Intravenous Not Given 02/07/17 2131)  levothyroxine (SYNTHROID, LEVOTHROID) tablet 100 mcg (not administered)    And  levothyroxine (SYNTHROID, LEVOTHROID) tablet 75 mcg (not administered)  MEDLINE mouth rinse (15 mLs Mouth Rinse Given 02/07/17 2130)  levalbuterol (XOPENEX) nebulizer solution 1.25 mg (1.25 mg Nebulization Given 02/07/17 0923)  levalbuterol (XOPENEX) nebulizer solution 1.25 mg (1.25 mg Nebulization Given 02/07/17 1016)  sodium chloride 0.9 % bolus 500 mL (0 mLs Intravenous Stopped 02/07/17 1255)  methylPREDNISolone sodium succinate (SOLU-MEDROL) 125 mg/2 mL injection 125 mg (125 mg Intravenous Given 02/07/17 1140)     Initial Impression / Assessment and Plan / ED Course  I have reviewed the triage vital signs and the nursing notes.  Pertinent labs & imaging results that were available during my care of the patient were reviewed by me and considered in  my medical decision making (see chart for details).    Patient with this persistent wheezing despite multiple breathing treatments. She is now more alert. Her drowsiness may be due to lack of sleep but also probably combination with opioids. She still is requiring supplemental oxygen to maintain saturations in the 90s. Discussed with hospitalists and will accept in transfer and admit.   Final Clinical Impressions(s) / ED Diagnoses   Final diagnoses:  Bronchospasm   Hypoxia    New Prescriptions Current Discharge Medication List    START taking these medications   Details  doxycycline (VIBRA-TABS) 100 MG tablet Take 1 tablet (100 mg total) by mouth 2 (two) times daily. Qty: 10 tablet, Refills: 0    fluticasone (FLONASE) 50 MCG/ACT nasal spray Place 1 spray into both nostrils daily. Qty: 16 g, Refills: 0    guaiFENesin (MUCINEX) 600 MG 12 hr tablet Take 1 tablet (600 mg total) by mouth 2 (two) times daily. Qty: 20 tablet, Refills: 0         Julianne Rice, MD 02/08/17 1103

## 2017-02-07 NOTE — Progress Notes (Signed)
Called by Dr. Lita Mains regarding patient Wendy Roth, 83 year old female who was admitted and discharged couple of days ago with SVT, respiratory failure, presents to the emergency room with unrelenting cough and shortness of breath.  She is hypoxic in the ED requiring supplemental oxygen and is wheezing, without clear etiology.  Accepted to telemetry   RN, on patient arrival, please call [phone] and let patient placement RN know of the patient's arrival and a hospitalist will be assigned to admit the patient.   Makayela Secrest M. Cruzita Lederer, MD [facility] [phone]  If 7PM-7AM, please contact night-coverage [email or web address] Password [facility]  11/14/2016, 5:37 AM

## 2017-02-07 NOTE — ED Notes (Signed)
Turned O2 to 2L due to SAT of 90%

## 2017-02-07 NOTE — ED Notes (Signed)
Patient denies pain and is resting comfortably.  

## 2017-02-07 NOTE — ED Notes (Signed)
Pt ambulatory to BR without assistance, in NAD. c/o fatigue.

## 2017-02-07 NOTE — ED Triage Notes (Signed)
Coughing, "non-stop" since last night. Was D/C'ed from [facility] yesterday and did not have a cough at that point.

## 2017-02-08 DIAGNOSIS — I471 Supraventricular tachycardia: Secondary | ICD-10-CM

## 2017-02-08 DIAGNOSIS — J9801 Acute bronchospasm: Secondary | ICD-10-CM | POA: Diagnosis not present

## 2017-02-08 DIAGNOSIS — J9601 Acute respiratory failure with hypoxia: Secondary | ICD-10-CM | POA: Diagnosis not present

## 2017-02-08 LAB — CBC
HCT: 40.3 % (ref 36.0–46.0)
Hemoglobin: 13.1 g/dL (ref 12.0–15.0)
MCH: 31.1 pg (ref 26.0–34.0)
MCHC: 32.5 g/dL (ref 30.0–36.0)
MCV: 95.7 fL (ref 78.0–100.0)
PLATELETS: 269 10*3/uL (ref 150–400)
RBC: 4.21 MIL/uL (ref 3.87–5.11)
RDW: 12.6 % (ref 11.5–15.5)
WBC: 8.3 10*3/uL (ref 4.0–10.5)

## 2017-02-08 LAB — COMPREHENSIVE METABOLIC PANEL
ALT: 31 U/L (ref 14–54)
AST: 25 U/L (ref 15–41)
Albumin: 3.3 g/dL — ABNORMAL LOW (ref 3.5–5.0)
Alkaline Phosphatase: 56 U/L (ref 38–126)
Anion gap: 7 (ref 5–15)
BUN: 25 mg/dL — AB (ref 6–20)
CHLORIDE: 103 mmol/L (ref 101–111)
CO2: 28 mmol/L (ref 22–32)
CREATININE: 1.12 mg/dL — AB (ref 0.44–1.00)
Calcium: 8.8 mg/dL — ABNORMAL LOW (ref 8.9–10.3)
GFR calc Af Amer: 52 mL/min — ABNORMAL LOW (ref >60)
GFR calc non Af Amer: 44 mL/min — ABNORMAL LOW (ref >60)
Glucose, Bld: 261 mg/dL — ABNORMAL HIGH (ref 65–99)
Potassium: 4.3 mmol/L (ref 3.5–5.1)
SODIUM: 138 mmol/L (ref 135–145)
Total Bilirubin: 0.4 mg/dL (ref 0.3–1.2)
Total Protein: 6 g/dL — ABNORMAL LOW (ref 6.5–8.1)

## 2017-02-08 LAB — URINE CULTURE

## 2017-02-08 MED ORDER — FLUTICASONE PROPIONATE 50 MCG/ACT NA SUSP: 1.0000 | Freq: Every day | NASAL | 0 refills | Status: DC

## 2017-02-08 MED ORDER — GUAIFENESIN ER 600 MG PO TB12: 600.0000 mg | ORAL_TABLET | Freq: Two times a day (BID) | ORAL | 0 refills | Status: AC

## 2017-02-08 MED ORDER — DOXYCYCLINE HYCLATE 100 MG PO TABS: 100.0000 mg | ORAL_TABLET | Freq: Two times a day (BID) | ORAL | 0 refills | Status: AC

## 2017-02-08 NOTE — Discharge Summary (Signed)
Discharge Summary  Wendy Roth YPP:[id number] DOB: 10/22/1931  PCP: Billie Ruddy I, NP  Admit date: 02/07/2017 Discharge date: 02/08/2017  Time spent: <15mins  Recommendations for Outpatient Follow-up:  1. F/u with PMD within a week  for hospital discharge follow up, repeat cbc/bmp at follow up, pmd to follow up final urine culture result.  Discharge Diagnoses:  Active Hospital Problems   Diagnosis Date Noted  . Hypoxia 02/07/2017  . Acute respiratory failure with hypoxia ([place]) 02/04/2017  . Chronic back pain 02/04/2017  . Depression 02/04/2017  . HLD (hyperlipidemia) 02/04/2017  . SVT (supraventricular tachycardia) ([place]) 02/04/2017  . Anxiety   . GERD (gastroesophageal reflux disease)   . Hypothyroidism   . Cancer of lower-inner quadrant of left female breast ([place]) 10/18/2012  . Coronary artery disease 10/07/2003    Resolved Hospital Problems   Diagnosis Date Noted Date Resolved  No resolved problems to display.    Discharge Condition: stable  Diet recommendation: heart healthy/carb modified  Filed Weights   02/07/17 0844 02/07/17 1347  Weight: 73 kg (161 lb) 72.1 kg (158 lb 15.2 oz)    History of present illness:  PCP: Linus Mako, NP  Patient coming from: home  Chief Complaint: Cough,  HPI: SABAH Roth is a 83 y.o. female with medical history significant of hypertension, hyperlipidemia, diabetes, GERD, depression, anxiety, breast cancer in remission, who presents to the emergency room with chief complaint of cough and shortness of breath.  She was recently hospitalized and discharged 5/3 from [facility] for SVT, resolved, acute respiratory failure which is also resolved.  She underwent a full workup therefore her hypoxia with a CT angiogram on 02/04/2017 which was negative for PE, as well as a 2D echo which showed an ejection fraction of 45-80%, grade 1 diastolic dysfunction. Patient tells me that the day she went home, she felt a  little bit of a scratchy throat, however did not pay much attention to it.  After she got home, that evening and yesterday, she started having significant cough and overnight last night was almost unable to sleep due to continuous coughing.  She reports no shortness of breath, no chest pain, no palpitations.  She denies any fever or chills.  She denies any abdominal pain, nausea, vomiting or diarrhea  ED Course: In the ED she is afebrile, heart rate is normal, her blood pressure is within normal limits as well.  She was found to be desaturating into the low 80s on room air requiring supplemental oxygen.  She was also found to be wheezing requiring breathing treatments.  Her blood work is essentially unremarkable except for mildly elevated white count.  TRH was asked for admission for hypoxic respiratory failure   Hospital Course:  Active Problems:   Cancer of lower-inner quadrant of left female breast ([place])   SVT (supraventricular tachycardia) (H[facility])   Hypothyroidism   GERD (gastroesophageal reflux disease)   Coronary artery disease   Anxiety   HLD (hyperlipidemia)   Depression   Chronic back pain   Acute respiratory failure with hypoxia (H[facility])   Hypoxia   Acute respiratory failure with hypoxia ? -She also had the same problem last that she was hospitalized, and she underwent workup, with 2D echo as well as CT angiogram which showed normal ejection fraction, and no PE respectively.  She also had her lower extremity DVT ultrasound which was negative. -EDP reported significant wheezing, however she has no wheezing on exam by admitting MD Dr Cruzita Lederer ,  no wheezing on exam this am by me.  She denies shortness of breath. cxr no acute findings, she is on room air, not in respiratory distress -flu swab negative, no fever, she does has some nasal congestion and intermittent dry cough and sore throat, symptom possible reflex upper respiratory viral infection given scratchy throat, and potentially had  brief bronchospasm.  or seasonal allergy She is discharged home with flonase, mucinex and doxycycline  ( for antiinflammatory property)   Bacteria in urine, she denies urinary symptom, she is sent home on doxycycline for 5 daysanyway  pmd to follow up on final urine culture result.    SVT during her prior hospital stay from 5/1 to 5/3 -currently she is in sinus rhythm, continue home medications toptol-xl and cardizem  Hypothyroidism -Continue home Synthroid  GERD -Continue home medications  Prior history of breast cancer -Status post lumpectomy 2014, continue letrozole  History of coronary artery disease -No chest pain, continue aspirin, Lipitor and metoprolol -cardiac enzymes negative x3  Depression anxiety -Continue home medications  Hyperlipidemia -Continue Lipitor  Chronic back pain Continue oxycodone  Diet controlled diabetes mellitus -Most recent A1c 6.4 She report get steroid injection to her back once every few months   DVT prophylaxis while in the hospital: Lovenox  Code Status: DNR  Family Communication: husband over the phone Disposition Plan: home [facility] Consults called: none     Procedures:  none   Discharge Exam: BP 137/60 (BP Location: Right Arm)   Pulse 65   Temp 98.4 F (36.9 C) (Oral)   Resp 19   Ht 5' (1.524 m)   Wt 72.1 kg (158 lb 15.2 oz)   SpO2 93%   BMI 31.04 kg/m   General: NAD Cardiovascular: RRR Respiratory: CTABL Extremity : no edema  Discharge Instructions You were cared for by a hospitalist during your hospital stay. If you have any questions about your discharge medications or the care you received while you were in the hospital after you are discharged, you can call the unit and asked to speak with the hospitalist on call if the hospitalist that took care of you is not available. Once you are discharged, your primary care physician will handle any further medical issues. Please note  that NO REFILLS for any discharge medications will be authorized once you are discharged, as it is imperative that you return to your primary care physician (or establish a relationship with a primary care physician if you do not have one) for your aftercare needs so that they can reassess your need for medications and monitor your lab values.  Discharge Instructions    Diet - low sodium heart healthy    Complete by:  As directed    Increase activity slowly    Complete by:  As directed      Allergies as of 02/08/2017      Reactions   No Known Allergies       Medication List    TAKE these medications   aspirin 81 MG tablet Take 81 mg by mouth every evening.   atorvastatin 20 MG tablet Commonly known as:  LIPITOR Take 20 mg by mouth at bedtime.   CALCIUM-MAGNESIUM-VITAMIN D PO Take 1 tablet by mouth 2 (two) times daily.   clonazePAM 0.5 MG tablet Commonly known as:  KLONOPIN Take 0.5 mg by mouth at bedtime as needed for anxiety.   diltiazem 120 MG 24 hr capsule Commonly known as:  CARDIZEM CD Take 1 capsule (120 mg  total) by mouth daily.   doxycycline 100 MG tablet Commonly known as:  VIBRA-TABS Take 1 tablet (100 mg total) by mouth 2 (two) times daily.   DULoxetine 60 MG capsule Commonly known as:  CYMBALTA Take 60 mg by mouth 2 (two) times daily.   famotidine 20 MG tablet Commonly known as:  PEPCID Take 20 mg by mouth at bedtime.   fluticasone 50 MCG/ACT nasal spray Commonly known as:  FLONASE Place 1 spray into both nostrils daily.   guaiFENesin 600 MG 12 hr tablet Commonly known as:  MUCINEX Take 1 tablet (600 mg total) by mouth 2 (two) times daily.   latanoprost 0.005 % ophthalmic solution Commonly known as:  XALATAN Place 1 drop into both eyes at bedtime.   letrozole 2.5 MG tablet Commonly known as:  FEMARA TAKE 1 TABLET DAILY   levothyroxine 25 MCG tablet Commonly known as:  SYNTHROID, LEVOTHROID Take 75-100 mcg by mouth See admin instructions. Mon  Wed Fri 100 mcg, and Sun Tues Thurs Sat 75 mc   lisinopril 20 MG tablet Commonly known as:  PRINIVIL,ZESTRIL Take 20 mg by mouth daily.   magnesium oxide 400 (241.3 Mg) MG tablet Commonly known as:  MAG-OX Take 1 tablet (400 mg total) by mouth daily.   metoprolol succinate 50 MG 24 hr tablet Commonly known as:  TOPROL-XL Take 50 mg by mouth daily. Take with or immediately following a meal.   omeprazole 40 MG capsule Commonly known as:  PRILOSEC Take 40 mg by mouth daily.   oxyCODONE 5 MG immediate release tablet Commonly known as:  Oxy IR/ROXICODONE Take 1-2 tablets (5-10 mg total) by mouth every 4 (four) hours as needed for moderate pain.   rOPINIRole 0.25 MG tablet Commonly known as:  REQUIP Take 0.5 mg by mouth at bedtime.   WELLBUTRIN SR 200 MG 12 hr tablet Generic drug:  buPROPion Take 200 mg by mouth daily.      Allergies  Allergen Reactions  . No Known Allergies       The results of significant diagnostics from this hospitalization (including imaging, microbiology, ancillary and laboratory) are listed below for reference.    Significant Diagnostic Studies: Ct Angio Chest Pe W Or Wo Contrast  Result Date: 02/04/2017 CLINICAL DATA:  Progressive shortness of breath. Hypertension. Diabetes. Breast cancer. EXAM: CT ANGIOGRAPHY CHEST WITH CONTRAST TECHNIQUE: Multidetector CT imaging of the chest was performed using the standard protocol during bolus administration of intravenous contrast. Multiplanar CT image reconstructions and MIPs were obtained to evaluate the vascular anatomy. CONTRAST:  80 cc of Isovue 370 COMPARISON:  Plain film 02/03/2017. FINDINGS: Cardiovascular: The quality of this exam for evaluation of pulmonary embolism is could. No evidence of pulmonary embolism. Aortic and branch vessel atherosclerosis. Tortuous thoracic aorta. Moderate cardiomegaly, without pericardial effusion. Pulmonary artery enlargement, outflow tract 3.2 cm. Multivessel coronary artery  atherosclerosis. Left main disease identified on image 58/series 5. Mediastinum/Nodes: No mediastinal or hilar adenopathy. No axillary or internal mammary adenopathy. Lungs/Pleura: No pleural fluid.  Clear lungs. Upper Abdomen: Normal imaged portions of the liver, spleen, stomach, adrenal glands, left kidney. Musculoskeletal: Thoracic spondylosis. Right hemidiaphragm elevation. Review of the MIP images confirms the above findings. IMPRESSION: 1.  No evidence of pulmonary embolism. 2. Pulmonary artery enlargement suggests pulmonary arterial hypertension. 3. Cardiomegaly. Coronary artery atherosclerosis. Aortic atherosclerosis. Electronically Signed   By: Abigail Miyamoto M.D.   On: 02/04/2017 13:59   Dg Chest Port 1 View  Result Date: 02/07/2017 CLINICAL DATA:  Cough EXAM:  PORTABLE CHEST 1 VIEW COMPARISON:  02/04/2017 FINDINGS: The heart size and mediastinal contours are within normal limits. Both lungs are clear. The visualized skeletal structures are unremarkable. IMPRESSION: No active disease. Electronically Signed   By: Kerby Moors M.D.   On: 02/07/2017 10:13   Dg Chest Portable 1 View  Result Date: 02/03/2017 CLINICAL DATA:  Tachycardia and dyspnea this evening. EXAM: PORTABLE CHEST 1 VIEW COMPARISON:  06/12/2016 FINDINGS: Mild chronic right hemidiaphragm elevation. No airspace consolidation. No large effusion. Normal pulmonary vasculature. Normal hilar and mediastinal contours, unchanged. IMPRESSION: No acute cardiopulmonary findings. Electronically Signed   By: Andreas Newport M.D.   On: 02/03/2017 23:05    Microbiology: No results found for this or any previous visit (from the past 240 hour(s)).   Labs: Basic Metabolic Panel:  Recent Labs Lab 02/03/17 2226 02/04/17 0808 02/07/17 0930 02/08/17 0506  NA 139 140 134* 138  K 3.9 4.0 3.7 4.3  CL 106 106 102 103  CO2 25 25 27 28   GLUCOSE 261* 151* 161* 261*  BUN 14 15 19  25*  CREATININE 1.01* 0.88 0.85 1.12*  CALCIUM 9.3 9.1 8.8* 8.8*    MG 1.7  --   --   --    Liver Function Tests:  Recent Labs Lab 02/03/17 2226 02/07/17 0930 02/08/17 0506  AST 28 27 25   ALT 22 32 31  ALKPHOS 67 63 56  BILITOT 0.5 1.2 0.4  PROT 6.3* 6.2* 6.0*  ALBUMIN 3.6 3.6 3.3*   No results for input(s): LIPASE, AMYLASE in the last 168 hours. No results for input(s): AMMONIA in the last 168 hours. CBC:  Recent Labs Lab 02/03/17 2226 02/04/17 0646 02/07/17 0930 02/08/17 0506  WBC 4.8 8.4 12.1* 8.3  NEUTROABS 3.5  --  8.3*  --   HGB 14.1 12.3 14.1 13.1  HCT 41.7 36.8 40.9 40.3  MCV 92.1 91.5 92.1 95.7  PLT 299 286 279 269   Cardiac Enzymes:  Recent Labs Lab 02/04/17 0235 02/04/17 0808 02/07/17 0930  TROPONINI <0.03 <0.03 <0.03   BNP: BNP (last 3 results)  Recent Labs  02/07/17 0930  BNP 36.9    ProBNP (last 3 results) No results for input(s): PROBNP in the last 8760 hours.  CBG:  Recent Labs Lab 02/04/17 2116 02/05/17 0614 02/05/17 1114 02/05/17 1609 02/07/17 1653  GLUCAP 161* 141* 181* 133* 290*       Signed:  Maxwell Martorano MD, PhD  [facility] 02/08/2017, 9:57 AM

## 2017-03-12 ENCOUNTER — Other Ambulatory Visit: Payer: Self-pay | Admitting: Hematology and Oncology

## 2017-03-12 ENCOUNTER — Other Ambulatory Visit: Payer: Self-pay | Admitting: Nurse Practitioner

## 2017-03-12 DIAGNOSIS — Z853 Personal history of malignant neoplasm of breast: Secondary | ICD-10-CM

## 2017-06-03 DIAGNOSIS — Z9181 History of falling: Secondary | ICD-10-CM

## 2017-06-03 HISTORY — DX: History of falling: Z91.81

## 2017-06-16 DIAGNOSIS — R52 Pain, unspecified: Secondary | ICD-10-CM

## 2017-06-16 DIAGNOSIS — J3089 Other allergic rhinitis: Secondary | ICD-10-CM

## 2017-06-16 DIAGNOSIS — E039 Hypothyroidism, unspecified: Secondary | ICD-10-CM

## 2017-06-16 DIAGNOSIS — H409 Unspecified glaucoma: Secondary | ICD-10-CM

## 2017-06-16 DIAGNOSIS — F419 Anxiety disorder, unspecified: Secondary | ICD-10-CM

## 2017-06-16 DIAGNOSIS — E7800 Pure hypercholesterolemia, unspecified: Secondary | ICD-10-CM

## 2017-06-16 DIAGNOSIS — E639 Nutritional deficiency, unspecified: Secondary | ICD-10-CM | POA: Insufficient documentation

## 2017-06-16 DIAGNOSIS — E559 Vitamin D deficiency, unspecified: Secondary | ICD-10-CM

## 2017-06-16 HISTORY — DX: Nutritional deficiency, unspecified: E63.9

## 2017-06-16 HISTORY — DX: Anxiety disorder, unspecified: F41.9

## 2017-06-16 HISTORY — DX: Unspecified glaucoma: H40.9

## 2017-06-16 HISTORY — DX: Other allergic rhinitis: J30.89

## 2017-06-16 HISTORY — DX: Vitamin D deficiency, unspecified: E55.9

## 2017-06-16 HISTORY — DX: Pain, unspecified: R52

## 2017-06-16 HISTORY — DX: Hypothyroidism, unspecified: E03.9

## 2017-06-16 HISTORY — DX: Pure hypercholesterolemia, unspecified: E78.00

## 2017-07-07 DIAGNOSIS — K648 Other hemorrhoids: Secondary | ICD-10-CM

## 2017-07-07 DIAGNOSIS — F0280 Dementia in other diseases classified elsewhere without behavioral disturbance: Secondary | ICD-10-CM | POA: Insufficient documentation

## 2017-07-07 DIAGNOSIS — M79645 Pain in left finger(s): Secondary | ICD-10-CM

## 2017-07-07 DIAGNOSIS — M545 Low back pain, unspecified: Secondary | ICD-10-CM

## 2017-07-07 DIAGNOSIS — M62838 Other muscle spasm: Secondary | ICD-10-CM

## 2017-07-07 DIAGNOSIS — R600 Localized edema: Secondary | ICD-10-CM

## 2017-07-07 DIAGNOSIS — K5900 Constipation, unspecified: Secondary | ICD-10-CM

## 2017-07-07 DIAGNOSIS — D510 Vitamin B12 deficiency anemia due to intrinsic factor deficiency: Secondary | ICD-10-CM

## 2017-07-07 DIAGNOSIS — M25562 Pain in left knee: Secondary | ICD-10-CM | POA: Insufficient documentation

## 2017-07-07 DIAGNOSIS — T7840XA Allergy, unspecified, initial encounter: Secondary | ICD-10-CM | POA: Insufficient documentation

## 2017-07-07 DIAGNOSIS — I2510 Atherosclerotic heart disease of native coronary artery without angina pectoris: Secondary | ICD-10-CM | POA: Insufficient documentation

## 2017-07-07 DIAGNOSIS — M25572 Pain in left ankle and joints of left foot: Secondary | ICD-10-CM

## 2017-07-07 DIAGNOSIS — E785 Hyperlipidemia, unspecified: Secondary | ICD-10-CM | POA: Insufficient documentation

## 2017-07-07 DIAGNOSIS — R102 Pelvic and perineal pain: Secondary | ICD-10-CM

## 2017-07-07 DIAGNOSIS — R109 Unspecified abdominal pain: Secondary | ICD-10-CM

## 2017-07-07 HISTORY — DX: Other muscle spasm: M62.838

## 2017-07-07 HISTORY — DX: Pelvic and perineal pain: R10.2

## 2017-07-07 HISTORY — DX: Pain in left ankle and joints of left foot: M25.572

## 2017-07-07 HISTORY — DX: Localized edema: R60.0

## 2017-07-07 HISTORY — DX: Low back pain, unspecified: M54.50

## 2017-07-07 HISTORY — DX: Vitamin B12 deficiency anemia due to intrinsic factor deficiency: D51.0

## 2017-07-07 HISTORY — DX: Pain in left knee: M25.562

## 2017-07-07 HISTORY — DX: Hyperlipidemia, unspecified: E78.5

## 2017-07-07 HISTORY — DX: Dementia in other diseases classified elsewhere, unspecified severity, without behavioral disturbance, psychotic disturbance, mood disturbance, and anxiety: F02.80

## 2017-07-07 HISTORY — DX: Constipation, unspecified: K59.00

## 2017-07-07 HISTORY — DX: Other hemorrhoids: K64.8

## 2017-07-07 HISTORY — DX: Unspecified abdominal pain: R10.9

## 2017-07-07 HISTORY — DX: Atherosclerotic heart disease of native coronary artery without angina pectoris: I25.10

## 2017-07-07 HISTORY — DX: Pain in left finger(s): M79.645

## 2017-07-07 HISTORY — DX: Allergy, unspecified, initial encounter: T78.40XA

## 2017-07-23 ENCOUNTER — Ambulatory Visit: Payer: Medicare Other | Admitting: Hematology and Oncology

## 2017-07-23 NOTE — Assessment & Plan Note (Deleted)
Right breast invasive ductal carcinomaT2, N0, M0 stage II A. with DCIS ER was n.p.o. 100% HER-2 negative Ki-67 15% treated with neoadjuvant antiestrogen therapy followed by left lumpectomy followed by antiestrogen therapy with letrozole since October 2014.   Letrozole toxicities:  1. Arthritis and myalgias  2. Loss of libido  Otherwise tolerating letrozole extremely well without any major problems or concerns.  Breast cancer surveillance: Breast exam 07/23/2017 is normal  Annual mammograms 04/04/2016: Normal. breast density category C  Return to clinic in 1 year for followup

## 2017-08-18 ENCOUNTER — Ambulatory Visit
Admission: RE | Admit: 2017-08-18 | Discharge: 2017-08-18 | Disposition: A | Discharge status: Home / Self Care | Payer: Medicare Other | Source: Ambulatory Visit | Attending: Gastroenterology | Admitting: Gastroenterology

## 2017-08-18 ENCOUNTER — Other Ambulatory Visit: Payer: Self-pay | Admitting: Gastroenterology

## 2017-08-18 DIAGNOSIS — R1084 Generalized abdominal pain: Secondary | ICD-10-CM

## 2017-08-19 ENCOUNTER — Telehealth: Payer: Self-pay | Admitting: Hematology and Oncology

## 2017-08-19 NOTE — Telephone Encounter (Signed)
Spoke with patient's facility - rescheduled no show from October.

## 2017-08-21 ENCOUNTER — Ambulatory Visit
Admission: RE | Admit: 2017-08-21 | Discharge: 2017-08-21 | Disposition: A | Discharge status: Home / Self Care | Payer: Medicare Other | Source: Ambulatory Visit | Attending: Nurse Practitioner | Admitting: Nurse Practitioner

## 2017-08-21 DIAGNOSIS — Z853 Personal history of malignant neoplasm of breast: Secondary | ICD-10-CM

## 2017-08-25 ENCOUNTER — Ambulatory Visit (HOSPITAL_BASED_OUTPATIENT_CLINIC_OR_DEPARTMENT_OTHER): Payer: Medicare Other | Admitting: Hematology and Oncology

## 2017-08-25 ENCOUNTER — Telehealth: Payer: Self-pay | Admitting: Hematology and Oncology

## 2017-08-25 DIAGNOSIS — C50312 Malignant neoplasm of lower-inner quadrant of left female breast: Secondary | ICD-10-CM | POA: Diagnosis present

## 2017-08-25 DIAGNOSIS — Z170 Estrogen receptor positive status [ER+]: Secondary | ICD-10-CM

## 2017-08-25 DIAGNOSIS — Z79811 Long term (current) use of aromatase inhibitors: Secondary | ICD-10-CM

## 2017-08-25 NOTE — Progress Notes (Signed)
Patient Care Team: Billie Ruddy I, NP as PCP - General (Nurse Practitioner)  DIAGNOSIS:  Encounter Diagnosis  Name Primary?  . Malignant neoplasm of lower-inner quadrant of left breast in female, estrogen receptor positive ([place])     SUMMARY OF ONCOLOGIC HISTORY:   Cancer of lower-inner quadrant of left female breast ([place])   10/12/2012 Initial Diagnosis    Right breast biopsy: Invasive ductal carcinoma with DCIS ER 100%, PR 100%, Ki-67 15%, HER-2 negative ratio 1.13      10/13/2012 - 07/18/2013 Anti-estrogen oral therapy    Neoadjuvant Antiestrogen therapy with Femara 2.5 mg daily      07/05/2013 Surgery    Right breast lumpectomy: Invasive ductal carcinoma grade 1-2.2 cm with low-grade DCIS, the excision margins negative      08/17/2013 -  Anti-estrogen oral therapy    Letrozole 2.5 mg daily for 5 years       CHIEF COMPLIANT: Follow-up on letrozole therapy  INTERVAL HISTORY: Wendy Roth is a 83 year old with above-mentioned history of left breast cancer treated with neoadjuvant antiestrogen therapy followed by lumpectomy.  She is current currently on letrozole therapy and has completed 5 years of treatment.  She tolerated it fairly well.  She denies any lumps or nodules in the breast.  REVIEW OF SYSTEMS:   Constitutional: Denies fevers, chills or abnormal weight loss Eyes: Denies blurriness of vision Ears, nose, mouth, throat, and face: Denies mucositis or sore throat Respiratory: Denies cough, dyspnea or wheezes Cardiovascular: Denies palpitation, chest discomfort Gastrointestinal:  Denies nausea, heartburn or change in bowel habits Skin: Denies abnormal skin rashes Lymphatics: Denies new lymphadenopathy or easy bruising Neurological:Denies numbness, tingling or new weaknesses Behavioral/Psych: Mood is stable, no new changes  Extremities: No lower extremity edema Breast:  denies any pain or lumps or nodules in either breasts All other systems were reviewed  with the patient and are negative.  I have reviewed the past medical history, past surgical history, social history and family history with the patient and they are unchanged from previous note.  ALLERGIES:  is allergic to no known allergies.  MEDICATIONS:  Current Outpatient Medications  Medication Sig Dispense Refill  . aspirin 81 MG tablet Take 81 mg by mouth every evening.     Marland Kitchen atorvastatin (LIPITOR) 20 MG tablet Take 20 mg by mouth at bedtime.    Marland Kitchen buPROPion (WELLBUTRIN SR) 200 MG 12 hr tablet Take 200 mg by mouth daily.    Marland Kitchen CALCIUM-MAGNESIUM-VITAMIN D PO Take 1 tablet by mouth 2 (two) times daily.    . clonazePAM (KLONOPIN) 0.5 MG tablet Take 0.5 mg by mouth at bedtime as needed for anxiety.    Marland Kitchen diltiazem (CARDIZEM CD) 120 MG 24 hr capsule Take 1 capsule (120 mg total) by mouth daily. 30 capsule 2  . DULoxetine (CYMBALTA) 60 MG capsule Take 60 mg by mouth 2 (two) times daily.    . famotidine (PEPCID) 20 MG tablet Take 20 mg by mouth at bedtime.     . fluticasone (FLONASE) 50 MCG/ACT nasal spray Place 1 spray into both nostrils daily. 16 g 0  . latanoprost (XALATAN) 0.005 % ophthalmic solution Place 1 drop into both eyes at bedtime.    Marland Kitchen letrozole (FEMARA) 2.5 MG tablet TAKE 1 TABLET DAILY 90 tablet 3  . levothyroxine (SYNTHROID, LEVOTHROID) 25 MCG tablet Take 75-100 mcg by mouth See admin instructions. Mon Wed Fri 100 mcg, and Sun Tues Thurs Sat 75 mc    . lisinopril (PRINIVIL,ZESTRIL) 20 MG  tablet Take 20 mg by mouth daily.    . magnesium oxide (MAG-OX) 400 (241.3 Mg) MG tablet Take 1 tablet (400 mg total) by mouth daily. 30 tablet 1  . metoprolol succinate (TOPROL-XL) 50 MG 24 hr tablet Take 50 mg by mouth daily. Take with or immediately following a meal.    . omeprazole (PRILOSEC) 40 MG capsule Take 40 mg by mouth daily.    Marland Kitchen oxyCODONE (OXY IR/ROXICODONE) 5 MG immediate release tablet Take 1-2 tablets (5-10 mg total) by mouth every 4 (four) hours as needed for moderate pain. 30  tablet 0  . rOPINIRole (REQUIP) 0.25 MG tablet Take 0.5 mg by mouth at bedtime.     No current facility-administered medications for this visit.     PHYSICAL EXAMINATION: ECOG PERFORMANCE STATUS: 0 - Asymptomatic  There were no vitals filed for this visit. There were no vitals filed for this visit.  GENERAL:alert, no distress and comfortable SKIN: skin color, texture, turgor are normal, no rashes or significant lesions EYES: normal, Conjunctiva are pink and non-injected, sclera clear OROPHARYNX:no exudate, no erythema and lips, buccal mucosa, and tongue normal  NECK: supple, thyroid normal size, non-tender, without nodularity LYMPH:  no palpable lymphadenopathy in the cervical, axillary or inguinal LUNGS: clear to auscultation and percussion with normal breathing effort HEART: regular rate & rhythm and no murmurs and no lower extremity edema ABDOMEN:abdomen soft, non-tender and normal bowel sounds MUSCULOSKELETAL:no cyanosis of digits and no clubbing  NEURO: alert & oriented x 3 with fluent speech, no focal motor/sensory deficits EXTREMITIES: No lower extremity edema BREAST: No palpable masses or nodules in either right or left breasts. No palpable axillary supraclavicular or infraclavicular adenopathy no breast tenderness or nipple discharge. (exam performed in the presence of a chaperone)  LABORATORY DATA:  I have reviewed the data as listed   Chemistry      Component Value Date/Time   NA 138 02/08/2017 0506   NA 142 01/23/2015 1017   K 4.3 02/08/2017 0506   K 4.8 01/23/2015 1017   CL 103 02/08/2017 0506   CL 105 02/21/2013 0905   CO2 28 02/08/2017 0506   CO2 27 01/23/2015 1017   BUN 25 (H) 02/08/2017 0506   BUN 25.0 01/23/2015 1017   CREATININE 1.12 (H) 02/08/2017 0506   CREATININE 1.1 01/23/2015 1017      Component Value Date/Time   CALCIUM 8.8 (L) 02/08/2017 0506   CALCIUM 9.2 01/23/2015 1017   ALKPHOS 56 02/08/2017 0506   ALKPHOS 54 01/23/2015 1017   AST 25  02/08/2017 0506   AST 17 01/23/2015 1017   ALT 31 02/08/2017 0506   ALT 22 01/23/2015 1017   BILITOT 0.4 02/08/2017 0506   BILITOT 0.50 01/23/2015 1017       Lab Results  Component Value Date   WBC 8.3 02/08/2017   HGB 13.1 02/08/2017   HCT 40.3 02/08/2017   MCV 95.7 02/08/2017   PLT 269 02/08/2017   NEUTROABS 8.3 (H) 02/07/2017    ASSESSMENT & PLAN:  Cancer of lower-inner quadrant of left female breast ([place]) Right breast invasive ductal carcinoma T2, N0, M0 stage II A. with DCIS ER was n.p.o. 100% HER-2 negative Ki-67 15% treated with neoadjuvant antiestrogen therapy followed by left lumpectomy followed by antiestrogen therapy with letrozole since October 2014.   Letrozole toxicities:  1. Arthritis and myalgias  2. Loss of libido  Otherwise tolerating letrozole extremely well without any major problems or concerns. She is currently in an assisted living  facility and appears to be having lots of medical issues.  Because of this we recommended that she stop after 5 years of antiestrogen therapy which will be in October 2019.  Abdominal pain as well as a bone pain: 2 weeks ago it was much severe and it appears to have gotten better.  Because of this we decided not to do a scan on her.  If her pain returns then we may do a CT or a bone scan.  Breast cancer surveillance: Breast exam 07/24/2016 is normal  Annual mammograms 04/04/2016: Normal. breast density category C  Return to clinic in 1 year for followup   I spent 25 minutes talking to the patient of which more than half was spent in counseling and coordination of care.  No orders of the defined types were placed in this encounter.  The patient has a good understanding of the overall plan. she agrees with it. she will call with any problems that may develop before the next visit here.   Rulon Eisenmenger, MD 08/25/17

## 2017-08-25 NOTE — Telephone Encounter (Signed)
Gave patient AVS and calendar of upcoming November 2019 appointments.

## 2017-08-25 NOTE — Assessment & Plan Note (Signed)
Right breast invasive ductal carcinoma T2, N0, M0 stage II A. with DCIS ER was n.p.o. 100% HER-2 negative Ki-67 15% treated with neoadjuvant antiestrogen therapy followed by left lumpectomy followed by antiestrogen therapy with letrozole since October 2014.   Letrozole toxicities:  1. Arthritis and myalgias  2. Loss of libido  Otherwise tolerating letrozole extremely well without any major problems or concerns. We discussed the role of extended adjuvant therapy.  Also discussed the data between 7 years versus 10 years from the ABCSG 16 clinical trial showing no difference between 7 versus 10 years.  Based on this I recommended that she stay on antiestrogen therapy until 7 years.  Breast cancer surveillance: Breast exam 07/24/2016 is normal  Annual mammograms 04/04/2016: Normal. breast density category C  Return to clinic in 1 year for followup

## 2017-09-01 ENCOUNTER — Telehealth: Payer: Self-pay

## 2017-09-01 NOTE — Telephone Encounter (Signed)
Returning pt call, but unable to get a hold of patient. VM has not been set up and phone just keeps ringing. Will attempt to call again tomorrow.

## 2017-09-03 ENCOUNTER — Telehealth: Payer: Self-pay | Admitting: *Deleted

## 2017-09-03 ENCOUNTER — Other Ambulatory Visit: Payer: Self-pay

## 2017-09-03 DIAGNOSIS — R109 Unspecified abdominal pain: Secondary | ICD-10-CM

## 2017-09-03 NOTE — Progress Notes (Signed)
Called pt back to address her abdominal pain issues. After Dr.Gudena reviewed pt reported complaints, obtained order for CT abd/pelvis to evaluate pt concerns.   No prior auth per Tia for CT. Called central scheduling and pt appt for CT on 09/07/17 at 3pm. Pt confirmed this appt and understands that she will need to be NPO for 4hrs prior to test. Pt will be coming in tomorrow to pick up her oral contrast for Monday.   Told pt that once her Ct results come back, she will either receive a call from [place] or the nurse next week. Pt verbalized understanding and thankful for the call.

## 2017-09-03 NOTE — Telephone Encounter (Signed)
"  When I saw Dr. Lindi Adie last week he was concerned about my bowels.  Told to call if still having a problem for him to order test.  I think I may have a lower gastric cancer.  Return number [phone].  Living on pain pills.  Nurses bring pills every six hours for this pain to right buttock.  Now hurts a little on left.  Knees hurt favoring buttock pain to walk.  Rump hurts like my whole intestines are trying to come out.    Felt urge for BM as soon as I put my feet on the floor this morning.  Sat on commode twenty minutes, rocking back and forth, excruciating child birth pains.  Finally got two inch long stool, tailed off soft.  Before this morning I may have had BM Tuesday.     No balls for several weeks.  Hardly have bowel movements.  Move really slow and hard to go.         Nurses tried laxatives, causes blow outs of diarrhea.  No more laxatives.    Never had any blood in stools.  Did ask GI in [place] about banding years ago for hemorrhoids.  No swelling, bloating, tenderness or pain to abdomen, just anus.  Not eating or drinking a lot.  May drink 16 oz water daily and mealtimes." Routing call information to collaborative nurse and provider for review.  Further patient communication through collaborative nurse.

## 2017-09-04 ENCOUNTER — Other Ambulatory Visit: Payer: Self-pay

## 2017-09-04 NOTE — Telephone Encounter (Signed)
Spoke with pt and scheduled for ct abd/pelvis

## 2017-09-07 ENCOUNTER — Ambulatory Visit (HOSPITAL_COMMUNITY)
Admission: RE | Admit: 2017-09-07 | Discharge: 2017-09-07 | Disposition: A | Discharge status: Home / Self Care | Payer: Medicare Other | Source: Ambulatory Visit | Attending: Hematology and Oncology | Admitting: Hematology and Oncology

## 2017-09-07 DIAGNOSIS — K5730 Diverticulosis of large intestine without perforation or abscess without bleeding: Secondary | ICD-10-CM | POA: Diagnosis not present

## 2017-09-07 DIAGNOSIS — R109 Unspecified abdominal pain: Secondary | ICD-10-CM | POA: Diagnosis present

## 2017-09-07 DIAGNOSIS — K4090 Unilateral inguinal hernia, without obstruction or gangrene, not specified as recurrent: Secondary | ICD-10-CM | POA: Diagnosis not present

## 2017-09-07 DIAGNOSIS — K429 Umbilical hernia without obstruction or gangrene: Secondary | ICD-10-CM | POA: Insufficient documentation

## 2017-09-07 LAB — POCT I-STAT CREATININE: Creatinine, Ser: 1.1 mg/dL — ABNORMAL HIGH (ref 0.44–1.00)

## 2017-09-07 MED ORDER — IOPAMIDOL (ISOVUE-300) INJECTION 61%
100.0000 mL | Freq: Once | INTRAVENOUS | Status: AC | PRN
  Administered 2017-09-07: 100 mL via INTRAVENOUS

## 2017-09-07 MED ORDER — IOPAMIDOL (ISOVUE-300) INJECTION 61%
INTRAVENOUS | Status: AC
  Filled 2017-09-07: qty 100

## 2017-10-30 DIAGNOSIS — M48061 Spinal stenosis, lumbar region without neurogenic claudication: Secondary | ICD-10-CM

## 2017-10-30 DIAGNOSIS — M5136 Other intervertebral disc degeneration, lumbar region: Secondary | ICD-10-CM | POA: Insufficient documentation

## 2017-10-30 DIAGNOSIS — M51369 Other intervertebral disc degeneration, lumbar region without mention of lumbar back pain or lower extremity pain: Secondary | ICD-10-CM

## 2017-10-30 HISTORY — DX: Other intervertebral disc degeneration, lumbar region without mention of lumbar back pain or lower extremity pain: M51.369

## 2017-10-30 HISTORY — DX: Spinal stenosis, lumbar region without neurogenic claudication: M48.061

## 2017-10-30 HISTORY — DX: Other intervertebral disc degeneration, lumbar region: M51.36

## 2017-11-18 DIAGNOSIS — M4310 Spondylolisthesis, site unspecified: Secondary | ICD-10-CM

## 2017-11-18 HISTORY — DX: Spondylolisthesis, site unspecified: M43.10

## 2017-11-20 DIAGNOSIS — M1711 Unilateral primary osteoarthritis, right knee: Secondary | ICD-10-CM

## 2017-11-20 DIAGNOSIS — M25562 Pain in left knee: Secondary | ICD-10-CM | POA: Insufficient documentation

## 2017-11-20 HISTORY — DX: Unilateral primary osteoarthritis, right knee: M17.11

## 2017-12-01 NOTE — H&P (Addendum)
Patient ID: Wendy Roth MRN: [id number] DOB/AGE: [birth year] 84 y.o.  Admit date: (Not on file)  Admission Diagnoses:  Lumbar Spinal stenosis  HPI: H&P.  Pt had a hx of breast cancer.  the pt is type 2 DM The patient states that their condition has remained unchanged since last visit. Their pain has worsened and their condition continues to affect their daily life and they are unable to perform their normal activities.    Past Medical History: Past Medical History:  Diagnosis Date  . Anginal pain ([place])   . Anxiety   . Arthritis   . Back pain   . Breast cancer ([place])   . Carpal tunnel syndrome   . Coronary artery disease 2005   stent placed  . Depression   . Diabetes mellitus without complication ([facility])    diet controlled.   . Fatigue   . GERD (gastroesophageal reflux disease)    "years ago" but still takes Prilosec  . Glaucoma    left eye  . History of kidney stones   . Hypertension   . Hypothyroidism   . Restless leg syndrome   . Seizures ([place])    as a baby    Surgical History: Past Surgical History:  Procedure Laterality Date  . ABDOMINAL HYSTERECTOMY    . ANGIOPLASTY     x2  . BREAST LUMPECTOMY     left x2  . BREAST LUMPECTOMY WITH NEEDLE LOCALIZATION Right 07/05/2013   Procedure: RIGHT BREAST WIRE GUIDED LUMPECTOMY ;  Surgeon: Rolm Bookbinder, MD;  Location: [facility];  Service: General;  Laterality: Right;  . CARPAL TUNNEL RELEASE     bilateral  . COLONOSCOPY    . CORONARY ANGIOPLASTY  2005   stent  . EYE SURGERY Bilateral    cataracts  . HERNIA REPAIR    . INSERTION OF MESH N/A 06/11/2016   Procedure: INSERTION OF MESH;  Surgeon: Rolm Bookbinder, MD;  Location: [facility];  Service: General;  Laterality: N/A;  . LAPAROSCOPIC INCISIONAL / UMBILICAL / [place]  06/11/2016   UHR w/mesh  . MENISCUS REPAIR     left  . PARATHYROIDECTOMY    . RE-EXCISION OF BREAST CANCER,SUPERIOR MARGINS Right 08/01/2013   Procedure: RE-EXCISION OF  BREAST CANCER MARGIN;  Surgeon: Rolm Bookbinder, MD;  Location: [facility];  Service: General;  Laterality: Right;  . TONSILLECTOMY    . UMBILICAL HERNIA REPAIR N/A 06/11/2016   Procedure: LAPAROSCOPIC UMBILICAL HERNIA;  Surgeon: Rolm Bookbinder, MD;  Location[facility];  Service: General;  Laterality: N/A;    Family History: Family History  Problem Relation Age of Onset  . Cancer Mother 41       lung cancer   . Cancer Father 62       possible breast cancer  . Cancer Sister        lung cancer ? primary; paternal half sister  . Breast cancer Maternal Aunt 70       ? bilateral  . Cancer Maternal Grandmother 79       prostate cancer    Social History: Social History   Socioeconomic History  . Marital status: Married    Spouse name: Not on file  . Number of children: Not on file  . Years of education: Not on file  . Highest education level: Not on file  Social Needs  . Financial resource strain: Not on file  . Food insecurity - worry: Not on file  . Food insecurity - inability:  Not on file  . Transportation needs - medical: Not on file  . Transportation needs - non-medical: Not on file  Occupational History  . Not on file  Tobacco Use  . Smoking status: Never Smoker  . Smokeless tobacco: Never Used  Substance and Sexual Activity  . Alcohol use: Yes    Comment: socially  . Drug use: No  . Sexual activity: Not Currently  Other Topics Concern  . Not on file  Social History Narrative  . Not on file    Allergies: No known allergies  Medications: I have reviewed the patient's current medications.  Vital Signs: No data found.  Radiology: No results found.  Labs: No results for input(s): WBC, RBC, HCT, PLT in the last 72 hours. No results for input(s): NA, K, CL, CO2, BUN, CREATININE, GLUCOSE, CALCIUM in the last 72 hours. No results for input(s): LABPT, INR in the last 72 hours.  Review of Systems: ROS  Physical Exam: There is no height or weight on file to  calculate BMI.  Physical Exam  Constitutional: She is oriented to person, place, and time. She appears well-developed and well-nourished.  HENT:  Head: Normocephalic.  Eyes: Pupils are equal, round, and reactive to light.  Neck: Normal range of motion. Neck supple.  Cardiovascular: Normal rate and regular rhythm.  Respiratory: Effort normal and breath sounds normal.  GI: Soft. Bowel sounds are normal.  Neurological: She is alert and oriented to person, place, and time.  Skin: Skin is warm and dry.  Psychiatric: She has a normal mood and affect. Her behavior is normal. Judgment and thought content normal.  Patient is alert 3 no shortness of breath no chest pain abdomen is soft and nontender negative for bowel or bladder loss of control negative for skin lesions abrasions contusions peripheral pulses 2+ gait pattern patient ambulates with a forward flexion lumbar posture utilizing a rolling walker patient has motor strength in bilateral lower extremities severe low back pain and dysesthesias symptoms improve with flexion worsened with neutral position or extension negative Babinski's negative Hoffmann's no clonus  MRI from February 2019 as she does have multifactorial severe spinal stenosis L4-5 L5-S1 severe foraminal stenosis is noted at both levels she is moderate to severe spinal stenosis at L3-4 degenerative spondylolisthesis L4-5 and L5-S1   Assessment and Plan: Risks and benefits of surgery were discussed with the patient. These include: Infection, bleeding, death, stroke, paralysis, ongoing or worse pain, need for additional surgery, leak of spinal fluid, adjacent segment degeneration requiring additional surgery, post-operative hematoma formation that can result in neurological compromise and the need for urgent/emergent re-operation. Loss in bowel and bladder control. Injury to major vessels that could result in the need for urgent abdominal surgery to stop bleeding. Risk of deep venous  thrombosis (DVT) and the need for additional treatment. Recurrent disc herniation resulting in the need for revision surgery, which could include fusion surgery (utilizing instrumentation such as pedicle screws and intervertebral cages).  Additional risk: If instrumentation is used to address spinal stenosis there is a risk of migration, or breakage of that hardware that could require additional surgery.  Goal of surgery: Reduce (not eliminate) pain, and improve quality of life.    Ronette Deter, PAC for Melina Schools, MD [place] [phone]  Patient has significant back buttock and bilateral neuropathic leg pain right side worse than the left.  Attempts at conservative management failed to alleviate her symptoms and her quality of life continues to suffer.  As a  result we have elected to move forward with a lumbar decompression and in situ fusion.  Given her age and medical comorbidities I do not think instrumentation is advisable.  We therefore will move forward with the in situ fusion to minimize the risk of iatrogenic worsening of her spondylolisthesis.  I explained this to the patient and her husband and reviewed the risks and benefits and addressed all of their questions.

## 2017-12-14 ENCOUNTER — Encounter (HOSPITAL_COMMUNITY): Payer: Self-pay

## 2017-12-14 ENCOUNTER — Other Ambulatory Visit: Payer: Self-pay

## 2017-12-14 ENCOUNTER — Encounter (HOSPITAL_COMMUNITY)
Admission: RE | Admit: 2017-12-14 | Discharge: 2017-12-14 | Disposition: A | Discharge status: Home / Self Care | Payer: Medicare Other | Source: Ambulatory Visit | Attending: Orthopedic Surgery | Admitting: Orthopedic Surgery

## 2017-12-14 LAB — TYPE AND SCREEN
ABO/RH(D): A NEG
Antibody Screen: NEGATIVE

## 2017-12-14 LAB — BASIC METABOLIC PANEL
ANION GAP: 9 (ref 5–15)
BUN: 21 mg/dL — ABNORMAL HIGH (ref 6–20)
CALCIUM: 9 mg/dL (ref 8.9–10.3)
CO2: 24 mmol/L (ref 22–32)
Chloride: 105 mmol/L (ref 101–111)
Creatinine, Ser: 1.23 mg/dL — ABNORMAL HIGH (ref 0.44–1.00)
GFR, EST AFRICAN AMERICAN: 46 mL/min — AB (ref >60)
GFR, EST NON AFRICAN AMERICAN: 39 mL/min — AB (ref >60)
Glucose, Bld: 122 mg/dL — ABNORMAL HIGH (ref 65–99)
Potassium: 4.2 mmol/L (ref 3.5–5.1)
Sodium: 138 mmol/L (ref 135–145)

## 2017-12-14 LAB — GLUCOSE, CAPILLARY: GLUCOSE-CAPILLARY: 171 mg/dL — AB (ref 65–99)

## 2017-12-14 LAB — CBC
HCT: 43.5 % (ref 36.0–46.0)
Hemoglobin: 14.3 g/dL (ref 12.0–15.0)
MCH: 31.8 pg (ref 26.0–34.0)
MCHC: 32.9 g/dL (ref 30.0–36.0)
MCV: 96.7 fL (ref 78.0–100.0)
PLATELETS: 330 10*3/uL (ref 150–400)
RBC: 4.5 MIL/uL (ref 3.87–5.11)
RDW: 13.4 % (ref 11.5–15.5)
WBC: 7 10*3/uL (ref 4.0–10.5)

## 2017-12-14 LAB — ABO/RH: ABO/RH(D): A NEG

## 2017-12-14 LAB — HEMOGLOBIN A1C
HEMOGLOBIN A1C: 6.1 % — AB (ref 4.8–5.6)
MEAN PLASMA GLUCOSE: 128.37 mg/dL

## 2017-12-14 LAB — SURGICAL PCR SCREEN
MRSA, PCR: NEGATIVE
STAPHYLOCOCCUS AUREUS: NEGATIVE

## 2017-12-14 NOTE — Pre-Procedure Instructions (Signed)
LAHELA WOODIN  12/14/2017      [facility] - [place], [place] - [address] [place] [address] Phone: [phone] Fax: [phone]  [facility] - [place], [place] [address] Phone: [phone] Fax: [phone]  [facility] [place], [place] [address] [place] [address] Phone: [phone] Fax: [phone]    Your procedure is scheduled on Thursday, March 14th   Report to [facility] Admitting at 8:30 AM             (posted surgery time 10:30a - 1:30p)   Call this number if you have problems the morning of surgery:  [phone]   Remember:              4-5 days prior to surgery, STOP TAKING ANY vitamins, herbal supplements, anti-inflammatories.   Do not eat food or drink liquids after midnight, Wednesday.   Take these medicines the morning of surgery with A SIP OF WATER : Zyrtec, Klonopin, Cymbalta, Levothyroxine, Metoprolol, Omeprazole.   Do not wear jewelry, make-up or nail polish.  Do not wear lotions, powders, or deodorants    Do not bring valuables to the hospital.  [facility] is not responsible for any belongings or valuables.  Contacts, dentures or bridgework may not be worn into surgery.  Leave your suitcase in the car.  After surgery it may be brought to your room.  For patients admitted to the hospital, discharge time will be determined by your treatment team.  Please read over the following fact sheets that you were given. Pain Booklet, MRSA Information and Surgical Site Infection Prevention      [place]- Preparing For Surgery  Before surgery, you can play an important role. Because skin is not sterile, your skin needs to be as free of germs as possible. You can reduce the number of germs on your skin by washing with CHG (chlorahexidine gluconate) Soap before  surgery.  CHG is an antiseptic cleaner which kills germs and bonds with the skin to continue killing germs even after washing.  Please do not use if you have an allergy to CHG or antibacterial soaps. If your skin becomes reddened/irritated stop using the CHG.  Do not shave (including legs and underarms) for at least 48 hours prior to first CHG shower. It is OK to shave your face.  Please follow these instructions carefully.   1. Shower the NIGHT BEFORE SURGERY and the MORNING OF SURGERY with CHG.   2. If you chose to wash your hair, wash your hair first as usual with your normal shampoo.  3. After you shampoo, rinse your hair and body thoroughly to remove the shampoo.  4. Use CHG as you would any other liquid soap. You can apply CHG directly to the skin and wash gently with a scrungie or a clean washcloth.   5. Apply the CHG Soap to your body ONLY FROM THE NECK DOWN.  Do not use on open wounds or open sores. Avoid contact with your eyes, ears, mouth and genitals (private parts). Wash Face and genitals (private parts)  with your normal soap.  6. Wash thoroughly, paying special attention to the area where your surgery will be performed.  7. Thoroughly rinse your body with warm water from the neck down.  8. DO NOT shower/wash with  your normal soap after using and rinsing off the CHG Soap.  9. Pat yourself dry with a CLEAN TOWEL.  10. Wear CLEAN PAJAMAS to bed the night before surgery, wear comfortable clothes the morning of surgery  11. Place CLEAN SHEETS on your bed the night of your first shower and DO NOT SLEEP WITH PETS.    Day of Surgery: Do not apply any deodorants/lotions. Please wear clean clothes to the hospital/surgery center.

## 2017-12-14 NOTE — Progress Notes (Signed)
Cardio is Dr. Tollie Eth.  I have requested last office note and any records. Dr. Jennette Kettle was her oncologist LOV 11/2017   H/O breast cancer and coming up on her 5 yr mark Did see Billie Ruddy. 09/2017 She is diet controlled diabetic.  Takes no meds. Patient states she had reaction to some medication Dr. Nelva Bush gave her, but unable to tell me what.  Dig some checking at Dr. Nelva Bush' office.  They will send [place] a copy of procedure, but on their records, it shows allergy to 'cortisone'.  Procedure in or prior to  May 1st she went to er for sob palpitations (note in epic)

## 2017-12-15 ENCOUNTER — Encounter (HOSPITAL_COMMUNITY): Payer: Self-pay

## 2017-12-15 NOTE — Progress Notes (Signed)
Anesthesia Chart Review: Patient is an 84 year old female scheduled for L3-S1 decompression with in situ fusion on 12/17/17 by Dr. Melina Schools.  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  - Hospitalized 02/07/17 - 02/08/17 for cough with hypoxia. CXR showed no active disease. She was treated with O2, nebulizers. Urine showed bacteria. She was discharged hom on Flonase, Mucinex, and doxycycline. - Hospitalization 02/03/17 - 02/05/17 for SVT followed by suspected aflutter that developed hours after receiving steroid injection (received 1 mL 240 mg/mL Omnipaque for selective epidurogram and then injection of 1 mL 10 mg/mL dexamethasone) earlier that day for chronic back pain. She returned to NSR with IV Cardizem. Treated for hypomagnesemia and for acute respiratory failure with hypoxia (felt related to SVT). Chest CTA negative for PE.   - PCP is Dr. Merilynn Finland. Last note is with Billie Ruddy, ANP at [facility]). See Care Everywhere. - Cardiologist is Dr. Viona Gilmore. Tollie Eth. Last visit 11/23/17 for follow-up and preoperative evaluation. He wrote, "From a cardiovascular viewpoint may proceed with surgery.  Her surgical risk would probably be average for a woman of her age this can be mildly increased at age and other comorbidities.  No additional cardiovascular workup is necessary." - HEM-ONC is Dr. Nicholas Lose.  Meds include ASA 81 mg (on hold) , Lipitor, baclofen, Wellbutrin XL, Zyrtec, Klonopin, Cardizem CD, Aricept, Cymbalta,  Pepcid, Flonase, Norco, Xalatan ophthalmic, Femara, levothyroxine, lisinopril, Mag-ox, Toprol XL, Prilosec, Requip.  BP (!) 124/57   Pulse 69   Temp (!) 36.4 C   Resp 20   Ht 5' (1.524 m)   Wt 157 lb (71.2 kg)   SpO2 96%   BMI 30.66 kg/m   EKG 12/14/17: SR with first degree AV block. Q wave in III. Non-significant Q wave in aVF. Non-specific anterolateral T wave abnormality is improved when compared to 11/23/17 tracing from Dr. Thurman Coyer office.   Echo 02/05/17: Study Conclusions - Left ventricle: The cavity size was normal. Systolic function was   normal. The estimated ejection fraction was in the range of 60%   to 65%. Doppler parameters are consistent with abnormal left   ventricular relaxation (grade 1 diastolic dysfunction). - Left atrium: The atrium was mildly to moderately dilated.  Nuclear stress test 09/07/14: Impression: 1.  Normal Lexiscan Myoview scan with no evidence of ischemia or infarction. 2.  Normal quantitative gated SPECT ejection fraction 67% with normal wall motion and wall thickening. Recommendations: No evidence of myocardial ischemia noted.  Continue medical therapy.  Cardiac cath 08/28/04: IMPRESSION: 1.  Successful stenting of the proximal left anterior descending with a drug-eluting stent. 2.  Placement of two non-drug-eluting stents in the distal left anterior descending. 3.  Minimal disease in the other vessels. 4.  Normal left ventricular function.  PCXR 02/07/17: IMPRESSION: No active disease.  CTA Chest 02/04/17: IMPRESSION: 1.  No evidence of pulmonary embolism. 2. Pulmonary artery enlargement suggests pulmonary arterial hypertension. 3. Cardiomegaly. Coronary artery atherosclerosis. Aortic atherosclerosis.  Preoperative labs noted. BUN 21, Cr 1.23 (previously 1.10 09/07/17). Glucose 122. CBC WNL. A1c 6.1. T&S done.   If no acute changes then I anticipate that  she can proceed as planned.  [facility] Short Stay  Center/Anesthesiology Phone [phone] 12/15/2017 11:42 AM

## 2017-12-17 ENCOUNTER — Ambulatory Visit (HOSPITAL_COMMUNITY): Payer: Medicare Other | Admitting: Vascular Surgery

## 2017-12-17 ENCOUNTER — Encounter (HOSPITAL_COMMUNITY)
Admission: RE | Disposition: A | Discharge status: Skilled Nursing Facility | Payer: Self-pay | Source: Ambulatory Visit | Attending: Orthopedic Surgery

## 2017-12-17 ENCOUNTER — Ambulatory Visit (HOSPITAL_COMMUNITY): Payer: Medicare Other

## 2017-12-17 ENCOUNTER — Other Ambulatory Visit: Payer: Self-pay

## 2017-12-17 ENCOUNTER — Inpatient Hospital Stay (HOSPITAL_COMMUNITY)
Admission: RE | Admit: 2017-12-17 | Discharge: 2017-12-20 | DRG: 460 | Disposition: A | Discharge status: Skilled Nursing Facility | Payer: Medicare Other | Source: Ambulatory Visit | Attending: Orthopedic Surgery | Admitting: Orthopedic Surgery

## 2017-12-17 ENCOUNTER — Ambulatory Visit (HOSPITAL_COMMUNITY): Payer: Medicare Other | Admitting: Certified Registered Nurse Anesthetist

## 2017-12-17 ENCOUNTER — Encounter (HOSPITAL_COMMUNITY): Payer: Self-pay | Admitting: Certified Registered Nurse Anesthetist

## 2017-12-17 DIAGNOSIS — Z0183 Encounter for blood typing: Secondary | ICD-10-CM

## 2017-12-17 DIAGNOSIS — M479 Spondylosis, unspecified: Secondary | ICD-10-CM | POA: Diagnosis present

## 2017-12-17 DIAGNOSIS — I2510 Atherosclerotic heart disease of native coronary artery without angina pectoris: Secondary | ICD-10-CM | POA: Diagnosis present

## 2017-12-17 DIAGNOSIS — Z01810 Encounter for preprocedural cardiovascular examination: Secondary | ICD-10-CM

## 2017-12-17 DIAGNOSIS — Z955 Presence of coronary angioplasty implant and graft: Secondary | ICD-10-CM | POA: Diagnosis not present

## 2017-12-17 DIAGNOSIS — Z7951 Long term (current) use of inhaled steroids: Secondary | ICD-10-CM

## 2017-12-17 DIAGNOSIS — H409 Unspecified glaucoma: Secondary | ICD-10-CM | POA: Diagnosis present

## 2017-12-17 DIAGNOSIS — R410 Disorientation, unspecified: Secondary | ICD-10-CM | POA: Diagnosis not present

## 2017-12-17 DIAGNOSIS — Z79890 Hormone replacement therapy (postmenopausal): Secondary | ICD-10-CM | POA: Diagnosis not present

## 2017-12-17 DIAGNOSIS — M4316 Spondylolisthesis, lumbar region: Secondary | ICD-10-CM | POA: Diagnosis present

## 2017-12-17 DIAGNOSIS — Z01812 Encounter for preprocedural laboratory examination: Secondary | ICD-10-CM | POA: Diagnosis not present

## 2017-12-17 DIAGNOSIS — F419 Anxiety disorder, unspecified: Secondary | ICD-10-CM | POA: Diagnosis present

## 2017-12-17 DIAGNOSIS — G9741 Accidental puncture or laceration of dura during a procedure: Secondary | ICD-10-CM | POA: Diagnosis not present

## 2017-12-17 DIAGNOSIS — Z79811 Long term (current) use of aromatase inhibitors: Secondary | ICD-10-CM

## 2017-12-17 DIAGNOSIS — K219 Gastro-esophageal reflux disease without esophagitis: Secondary | ICD-10-CM | POA: Diagnosis not present

## 2017-12-17 DIAGNOSIS — F329 Major depressive disorder, single episode, unspecified: Secondary | ICD-10-CM | POA: Diagnosis present

## 2017-12-17 DIAGNOSIS — C50919 Malignant neoplasm of unspecified site of unspecified female breast: Secondary | ICD-10-CM | POA: Diagnosis present

## 2017-12-17 DIAGNOSIS — E039 Hypothyroidism, unspecified: Secondary | ICD-10-CM | POA: Diagnosis present

## 2017-12-17 DIAGNOSIS — G2581 Restless legs syndrome: Secondary | ICD-10-CM | POA: Diagnosis present

## 2017-12-17 DIAGNOSIS — E119 Type 2 diabetes mellitus without complications: Secondary | ICD-10-CM | POA: Diagnosis not present

## 2017-12-17 DIAGNOSIS — M48062 Spinal stenosis, lumbar region with neurogenic claudication: Secondary | ICD-10-CM | POA: Diagnosis not present

## 2017-12-17 DIAGNOSIS — Z7982 Long term (current) use of aspirin: Secondary | ICD-10-CM

## 2017-12-17 DIAGNOSIS — Z9889 Other specified postprocedural states: Secondary | ICD-10-CM

## 2017-12-17 DIAGNOSIS — Z419 Encounter for procedure for purposes other than remedying health state, unspecified: Secondary | ICD-10-CM

## 2017-12-17 DIAGNOSIS — I10 Essential (primary) hypertension: Secondary | ICD-10-CM | POA: Diagnosis present

## 2017-12-17 HISTORY — PX: LUMBAR LAMINECTOMY/DECOMPRESSION MICRODISCECTOMY: SHX5026

## 2017-12-17 HISTORY — DX: Other specified postprocedural states: Z98.890

## 2017-12-17 LAB — GLUCOSE, CAPILLARY
GLUCOSE-CAPILLARY: 140 mg/dL — AB (ref 65–99)
GLUCOSE-CAPILLARY: 133 mg/dL — AB (ref 65–99)
Glucose-Capillary: 129 mg/dL — ABNORMAL HIGH (ref 65–99)
Glucose-Capillary: 140 mg/dL — ABNORMAL HIGH (ref 65–99)

## 2017-12-17 SURGERY — LUMBAR LAMINECTOMY/DECOMPRESSION MICRODISCECTOMY 2 LEVELS
Anesthesia: General

## 2017-12-17 MED ORDER — DOCUSATE SODIUM 100 MG PO CAPS
100.0000 mg | ORAL_CAPSULE | Freq: Two times a day (BID) | ORAL | Status: DC
  Administered 2017-12-17 – 2017-12-20 (×6): 100 mg via ORAL
  Filled 2017-12-17 (×6): qty 1

## 2017-12-17 MED ORDER — PHENYLEPHRINE 40 MCG/ML (10ML) SYRINGE FOR IV PUSH (FOR BLOOD PRESSURE SUPPORT)
PREFILLED_SYRINGE | INTRAVENOUS | Status: AC
  Filled 2017-12-17: qty 10

## 2017-12-17 MED ORDER — MENTHOL 3 MG MT LOZG: 1.0000 | LOZENGE | OROMUCOSAL | Status: DC | PRN

## 2017-12-17 MED ORDER — DILTIAZEM HCL ER COATED BEADS 120 MG PO CP24
120.0000 mg | ORAL_CAPSULE | Freq: Every evening | ORAL | Status: DC
  Administered 2017-12-17 – 2017-12-19 (×2): 120 mg via ORAL
  Filled 2017-12-17 (×3): qty 1

## 2017-12-17 MED ORDER — HYDROCODONE-ACETAMINOPHEN 10-325 MG PO TABS
1.0000 | ORAL_TABLET | ORAL | Status: DC | PRN
  Administered 2017-12-17 – 2017-12-19 (×3): 1 via ORAL
  Filled 2017-12-17 (×3): qty 1

## 2017-12-17 MED ORDER — NALOXONE HCL 0.4 MG/ML IJ SOLN
INTRAMUSCULAR | Status: DC | PRN
  Administered 2017-12-17 (×2): 40 ug via INTRAVENOUS

## 2017-12-17 MED ORDER — LISINOPRIL 20 MG PO TABS
20.0000 mg | ORAL_TABLET | Freq: Every day | ORAL | Status: DC
  Administered 2017-12-17 – 2017-12-20 (×3): 20 mg via ORAL
  Filled 2017-12-17 (×4): qty 1

## 2017-12-17 MED ORDER — ROCURONIUM BROMIDE 10 MG/ML (PF) SYRINGE
PREFILLED_SYRINGE | INTRAVENOUS | Status: AC
  Filled 2017-12-17: qty 5

## 2017-12-17 MED ORDER — METOPROLOL SUCCINATE ER 25 MG PO TB24
50.0000 mg | ORAL_TABLET | Freq: Every evening | ORAL | Status: DC
  Administered 2017-12-17 – 2017-12-19 (×2): 50 mg via ORAL
  Filled 2017-12-17: qty 2
  Filled 2017-12-17 (×2): qty 1

## 2017-12-17 MED ORDER — LACTATED RINGERS IV SOLN
INTRAVENOUS | Status: DC
  Administered 2017-12-17 (×2): via INTRAVENOUS

## 2017-12-17 MED ORDER — SODIUM CHLORIDE 0.9% FLUSH
3.0000 mL | Freq: Two times a day (BID) | INTRAVENOUS | Status: DC
  Administered 2017-12-18 (×2): 3 mL via INTRAVENOUS

## 2017-12-17 MED ORDER — LIDOCAINE HCL (CARDIAC) 20 MG/ML IV SOLN
INTRAVENOUS | Status: AC
  Filled 2017-12-17: qty 5

## 2017-12-17 MED ORDER — ROPINIROLE HCL 1 MG PO TABS
0.5000 mg | ORAL_TABLET | Freq: Every day | ORAL | Status: DC
  Administered 2017-12-17 – 2017-12-19 (×3): 0.5 mg via ORAL
  Filled 2017-12-17 (×3): qty 1

## 2017-12-17 MED ORDER — ROCURONIUM BROMIDE 100 MG/10ML IV SOLN
INTRAVENOUS | Status: DC | PRN
  Administered 2017-12-17 (×3): 20 mg via INTRAVENOUS
  Administered 2017-12-17: 40 mg via INTRAVENOUS
  Administered 2017-12-17 (×2): 10 mg via INTRAVENOUS
  Administered 2017-12-17: 20 mg via INTRAVENOUS

## 2017-12-17 MED ORDER — CEFAZOLIN SODIUM-DEXTROSE 2-4 GM/100ML-% IV SOLN
2.0000 g | INTRAVENOUS | Status: AC
  Administered 2017-12-17: 2 g via INTRAVENOUS

## 2017-12-17 MED ORDER — THROMBIN (RECOMBINANT) 20000 UNITS EX SOLR
CUTANEOUS | Status: AC
  Filled 2017-12-17: qty 20000

## 2017-12-17 MED ORDER — ONDANSETRON HCL 4 MG/2ML IJ SOLN: 4.0000 mg | Freq: Four times a day (QID) | INTRAMUSCULAR | Status: DC | PRN

## 2017-12-17 MED ORDER — HYDROCORTISONE ACE-PRAMOXINE 2.5-1 % RE CREA: 1.0000 "application " | TOPICAL_CREAM | Freq: Two times a day (BID) | RECTAL | Status: DC | PRN

## 2017-12-17 MED ORDER — ALBUMIN HUMAN 5 % IV SOLN
INTRAVENOUS | Status: DC | PRN
  Administered 2017-12-17: 14:00:00 via INTRAVENOUS

## 2017-12-17 MED ORDER — HYDROCODONE-ACETAMINOPHEN 5-325 MG PO TABS: 1.0000 | ORAL_TABLET | ORAL | 0 refills | Status: DC | PRN

## 2017-12-17 MED ORDER — ONDANSETRON HCL 4 MG/2ML IJ SOLN
INTRAMUSCULAR | Status: AC
  Filled 2017-12-17: qty 2

## 2017-12-17 MED ORDER — PROPOFOL 10 MG/ML IV BOLUS
INTRAVENOUS | Status: DC | PRN
  Administered 2017-12-17: 120 mg via INTRAVENOUS

## 2017-12-17 MED ORDER — POLYETHYLENE GLYCOL 3350 17 G PO PACK: 17.0000 g | PACK | Freq: Every day | ORAL | Status: DC | PRN

## 2017-12-17 MED ORDER — HYDROMORPHONE HCL 1 MG/ML IJ SOLN
0.2500 mg | INTRAMUSCULAR | Status: DC | PRN
  Administered 2017-12-17: 0.25 mg via INTRAVENOUS

## 2017-12-17 MED ORDER — METHOCARBAMOL 1000 MG/10ML IJ SOLN: 500.0000 mg | Freq: Four times a day (QID) | INTRAVENOUS | Status: DC | PRN

## 2017-12-17 MED ORDER — FENTANYL CITRATE (PF) 250 MCG/5ML IJ SOLN
INTRAMUSCULAR | Status: DC | PRN
  Administered 2017-12-17: 25 ug via INTRAVENOUS
  Administered 2017-12-17: 100 ug via INTRAVENOUS
  Administered 2017-12-17 (×3): 25 ug via INTRAVENOUS

## 2017-12-17 MED ORDER — LIDOCAINE HCL (CARDIAC) 20 MG/ML IV SOLN
INTRAVENOUS | Status: DC | PRN
  Administered 2017-12-17: 60 mg via INTRAVENOUS

## 2017-12-17 MED ORDER — PHENYLEPHRINE HCL 10 MG/ML IJ SOLN
INTRAMUSCULAR | Status: DC | PRN
  Administered 2017-12-17 (×2): 80 ug via INTRAVENOUS
  Administered 2017-12-17: 100 ug via INTRAVENOUS
  Administered 2017-12-17: 40 ug via INTRAVENOUS
  Administered 2017-12-17: 80 ug via INTRAVENOUS

## 2017-12-17 MED ORDER — METHOCARBAMOL 500 MG PO TABS
ORAL_TABLET | ORAL | Status: AC
  Filled 2017-12-17: qty 1

## 2017-12-17 MED ORDER — FENTANYL CITRATE (PF) 250 MCG/5ML IJ SOLN
INTRAMUSCULAR | Status: AC
  Filled 2017-12-17: qty 5

## 2017-12-17 MED ORDER — ATORVASTATIN CALCIUM 10 MG PO TABS
20.0000 mg | ORAL_TABLET | Freq: Every day | ORAL | Status: DC
  Administered 2017-12-17 – 2017-12-19 (×3): 20 mg via ORAL
  Filled 2017-12-17: qty 1
  Filled 2017-12-17: qty 2
  Filled 2017-12-17: qty 1

## 2017-12-17 MED ORDER — PHENYLEPHRINE HCL 10 MG/ML IJ SOLN
INTRAMUSCULAR | Status: AC
  Filled 2017-12-17: qty 1

## 2017-12-17 MED ORDER — OXYCODONE HCL 5 MG PO TABS
ORAL_TABLET | ORAL | Status: AC
  Filled 2017-12-17: qty 1

## 2017-12-17 MED ORDER — PANTOPRAZOLE SODIUM 40 MG PO TBEC
40.0000 mg | DELAYED_RELEASE_TABLET | Freq: Every day | ORAL | Status: DC
  Administered 2017-12-17 – 2017-12-20 (×4): 40 mg via ORAL
  Filled 2017-12-17 (×4): qty 1

## 2017-12-17 MED ORDER — PROPOFOL 10 MG/ML IV BOLUS
INTRAVENOUS | Status: AC
  Filled 2017-12-17: qty 20

## 2017-12-17 MED ORDER — ACETAMINOPHEN 325 MG PO TABS
650.0000 mg | ORAL_TABLET | ORAL | Status: DC | PRN
  Administered 2017-12-19: 650 mg via ORAL
  Filled 2017-12-17: qty 2

## 2017-12-17 MED ORDER — PHENYLEPHRINE HCL 10 MG/ML IJ SOLN
INTRAVENOUS | Status: DC | PRN
  Administered 2017-12-17: 25 ug/min via INTRAVENOUS

## 2017-12-17 MED ORDER — FAMOTIDINE 20 MG PO TABS
20.0000 mg | ORAL_TABLET | Freq: Every evening | ORAL | Status: DC
  Administered 2017-12-17 – 2017-12-19 (×3): 20 mg via ORAL
  Filled 2017-12-17 (×3): qty 1

## 2017-12-17 MED ORDER — PHENOL 1.4 % MT LIQD: 1.0000 | OROMUCOSAL | Status: DC | PRN

## 2017-12-17 MED ORDER — CEFAZOLIN SODIUM-DEXTROSE 2-4 GM/100ML-% IV SOLN
2.0000 g | Freq: Three times a day (TID) | INTRAVENOUS | Status: AC
  Administered 2017-12-17 – 2017-12-18 (×2): 2 g via INTRAVENOUS
  Filled 2017-12-17 (×2): qty 100

## 2017-12-17 MED ORDER — LETROZOLE 2.5 MG PO TABS
2.5000 mg | ORAL_TABLET | Freq: Every day | ORAL | Status: DC
  Administered 2017-12-17 – 2017-12-20 (×4): 2.5 mg via ORAL
  Filled 2017-12-17 (×4): qty 1

## 2017-12-17 MED ORDER — MAGNESIUM CITRATE PO SOLN
1.0000 | Freq: Once | ORAL | Status: AC | PRN
  Administered 2017-12-17: 1 via ORAL
  Filled 2017-12-17: qty 296

## 2017-12-17 MED ORDER — BUPIVACAINE-EPINEPHRINE (PF) 0.25% -1:200000 IJ SOLN
INTRAMUSCULAR | Status: AC
  Filled 2017-12-17: qty 30

## 2017-12-17 MED ORDER — MORPHINE SULFATE (PF) 4 MG/ML IV SOLN: 1.0000 mg | INTRAVENOUS | Status: DC | PRN

## 2017-12-17 MED ORDER — SODIUM CHLORIDE 0.9% FLUSH: 3.0000 mL | INTRAVENOUS | Status: DC | PRN

## 2017-12-17 MED ORDER — LACTATED RINGERS IV SOLN
INTRAVENOUS | Status: DC
  Administered 2017-12-17: 16:00:00 via INTRAVENOUS

## 2017-12-17 MED ORDER — SUGAMMADEX SODIUM 200 MG/2ML IV SOLN
INTRAVENOUS | Status: AC
  Filled 2017-12-17: qty 2

## 2017-12-17 MED ORDER — PROMETHAZINE HCL 25 MG/ML IJ SOLN: 6.2500 mg | INTRAMUSCULAR | Status: DC | PRN

## 2017-12-17 MED ORDER — OXYCODONE HCL 5 MG PO TABS: 5.0000 mg | ORAL_TABLET | Freq: Four times a day (QID) | ORAL | 0 refills | Status: DC | PRN

## 2017-12-17 MED ORDER — DONEPEZIL HCL 5 MG PO TABS
5.0000 mg | ORAL_TABLET | Freq: Every evening | ORAL | Status: DC
  Administered 2017-12-17 – 2017-12-19 (×3): 5 mg via ORAL
  Filled 2017-12-17 (×3): qty 1

## 2017-12-17 MED ORDER — CLONAZEPAM 1 MG PO TABS: 1.0000 mg | ORAL_TABLET | Freq: Two times a day (BID) | ORAL | Status: DC | PRN

## 2017-12-17 MED ORDER — BISACODYL 5 MG PO TBEC: 5.0000 mg | DELAYED_RELEASE_TABLET | Freq: Every day | ORAL | Status: DC | PRN

## 2017-12-17 MED ORDER — BUPIVACAINE-EPINEPHRINE (PF) 0.25% -1:200000 IJ SOLN
INTRAMUSCULAR | Status: DC | PRN
  Administered 2017-12-17: 10 mL

## 2017-12-17 MED ORDER — HYDROMORPHONE HCL 1 MG/ML IJ SOLN
INTRAMUSCULAR | Status: AC
  Filled 2017-12-17: qty 1

## 2017-12-17 MED ORDER — DEXAMETHASONE SODIUM PHOSPHATE 10 MG/ML IJ SOLN
INTRAMUSCULAR | Status: AC
  Filled 2017-12-17: qty 1

## 2017-12-17 MED ORDER — LORATADINE 10 MG PO TABS
10.0000 mg | ORAL_TABLET | Freq: Every day | ORAL | Status: DC
  Administered 2017-12-18 – 2017-12-20 (×3): 10 mg via ORAL
  Filled 2017-12-17 (×3): qty 1

## 2017-12-17 MED ORDER — ONDANSETRON 4 MG PO TBDP: 4.0000 mg | ORAL_TABLET | Freq: Three times a day (TID) | ORAL | 0 refills | Status: DC | PRN

## 2017-12-17 MED ORDER — DULOXETINE HCL 60 MG PO CPEP
120.0000 mg | ORAL_CAPSULE | Freq: Every day | ORAL | Status: DC
  Administered 2017-12-18 – 2017-12-20 (×3): 120 mg via ORAL
  Filled 2017-12-17 (×2): qty 4
  Filled 2017-12-17: qty 2

## 2017-12-17 MED ORDER — 0.9 % SODIUM CHLORIDE (POUR BTL) OPTIME
TOPICAL | Status: DC | PRN
  Administered 2017-12-17 (×3): 1000 mL

## 2017-12-17 MED ORDER — CEFAZOLIN SODIUM-DEXTROSE 2-4 GM/100ML-% IV SOLN
INTRAVENOUS | Status: AC
  Filled 2017-12-17: qty 100

## 2017-12-17 MED ORDER — OXYCODONE HCL 5 MG PO TABS
5.0000 mg | ORAL_TABLET | ORAL | Status: DC | PRN
  Administered 2017-12-17 – 2017-12-18 (×2): 5 mg via ORAL
  Administered 2017-12-18 (×2): 10 mg via ORAL
  Administered 2017-12-19 (×2): 5 mg via ORAL
  Filled 2017-12-17: qty 1
  Filled 2017-12-17: qty 2
  Filled 2017-12-17: qty 1
  Filled 2017-12-17: qty 2
  Filled 2017-12-17: qty 1

## 2017-12-17 MED ORDER — EPHEDRINE 5 MG/ML INJ
INTRAVENOUS | Status: AC
  Filled 2017-12-17: qty 10

## 2017-12-17 MED ORDER — THROMBIN (RECOMBINANT) 20000 UNITS EX SOLR
CUTANEOUS | Status: DC | PRN
  Administered 2017-12-17: 20 mL via TOPICAL

## 2017-12-17 MED ORDER — LATANOPROST 0.005 % OP SOLN
1.0000 [drp] | Freq: Every day | OPHTHALMIC | Status: DC
  Administered 2017-12-17 – 2017-12-18 (×2): 1 [drp] via OPHTHALMIC
  Filled 2017-12-17: qty 2.5

## 2017-12-17 MED ORDER — ONDANSETRON HCL 4 MG PO TABS: 4.0000 mg | ORAL_TABLET | Freq: Four times a day (QID) | ORAL | Status: DC | PRN

## 2017-12-17 MED ORDER — BUPROPION HCL ER (XL) 150 MG PO TB24
300.0000 mg | ORAL_TABLET | Freq: Every day | ORAL | Status: DC
  Administered 2017-12-18 – 2017-12-20 (×3): 300 mg via ORAL
  Filled 2017-12-17 (×2): qty 2
  Filled 2017-12-17 (×2): qty 1

## 2017-12-17 MED ORDER — MAGNESIUM OXIDE 400 (241.3 MG) MG PO TABS
400.0000 mg | ORAL_TABLET | Freq: Every day | ORAL | Status: DC
  Administered 2017-12-18 – 2017-12-20 (×3): 400 mg via ORAL
  Filled 2017-12-17 (×3): qty 1

## 2017-12-17 MED ORDER — ACETAMINOPHEN 650 MG RE SUPP: 650.0000 mg | RECTAL | Status: DC | PRN

## 2017-12-17 MED ORDER — SUGAMMADEX SODIUM 200 MG/2ML IV SOLN
INTRAVENOUS | Status: DC | PRN
  Administered 2017-12-17: 145 mg via INTRAVENOUS

## 2017-12-17 MED ORDER — EPHEDRINE SULFATE 50 MG/ML IJ SOLN
INTRAMUSCULAR | Status: DC | PRN
  Administered 2017-12-17: 5 mg via INTRAVENOUS
  Administered 2017-12-17: 10 mg via INTRAVENOUS
  Administered 2017-12-17 (×2): 5 mg via INTRAVENOUS
  Administered 2017-12-17: 10 mg via INTRAVENOUS
  Administered 2017-12-17: 15 mg via INTRAVENOUS

## 2017-12-17 MED ORDER — LEVOTHYROXINE SODIUM 75 MCG PO TABS
75.0000 ug | ORAL_TABLET | Freq: Every day | ORAL | Status: DC
  Administered 2017-12-18 – 2017-12-20 (×3): 75 ug via ORAL
  Filled 2017-12-17 (×3): qty 1

## 2017-12-17 MED ORDER — METHOCARBAMOL 500 MG PO TABS
500.0000 mg | ORAL_TABLET | Freq: Four times a day (QID) | ORAL | Status: DC | PRN
  Administered 2017-12-17 – 2017-12-19 (×5): 500 mg via ORAL
  Filled 2017-12-17 (×4): qty 1

## 2017-12-17 MED ORDER — ONDANSETRON HCL 4 MG/2ML IJ SOLN
INTRAMUSCULAR | Status: DC | PRN
Start: 2017-12-17 — End: 2017-12-17
  Administered 2017-12-17: 4 mg via INTRAVENOUS

## 2017-12-17 SURGICAL SUPPLY — 72 items
APL SRG 60D 8 XTD TIP BNDBL (TIP) ×1
BONE VIVIGEN FORMABLE 10CC (Bone Implant) ×3 IMPLANT
BUR EGG ELITE 4.0 (BURR) IMPLANT
BUR EGG ELITE 4.0MM (BURR)
BUR MATCHSTICK NEURO 3.0 LAGG (BURR) IMPLANT
BUR ROUND FLUTED 4 SOFT TCH (BURR) ×1 IMPLANT
BUR ROUND FLUTED 4MM SOFT TCH (BURR) ×1
CLOSURE STERI-STRIP 1/2X4 (GAUZE/BANDAGES/DRESSINGS) ×1
CLSR STERI-STRIP ANTIMIC 1/2X4 (GAUZE/BANDAGES/DRESSINGS) ×2 IMPLANT
CORD BI POLAR (MISCELLANEOUS) ×3 IMPLANT
DRAIN CHANNEL 15F RND FF W/TCR (WOUND CARE) ×2 IMPLANT
DRAPE MICROSCOPE LEICA 46X105 (MISCELLANEOUS) IMPLANT
DRAPE POUCH INSTRU U-SHP 10X18 (DRAPES) ×3 IMPLANT
DRAPE SURG 17X11 SM STRL (DRAPES) ×3 IMPLANT
DRAPE U-SHAPE 47X51 STRL (DRAPES) ×3 IMPLANT
DRSG OPSITE 4X5.5 SM (GAUZE/BANDAGES/DRESSINGS) ×2 IMPLANT
DRSG OPSITE POSTOP 4X8 (GAUZE/BANDAGES/DRESSINGS) ×2 IMPLANT
DURAPREP 26ML APPLICATOR (WOUND CARE) ×3 IMPLANT
DURASEAL APPLICATOR TIP (TIP) ×2 IMPLANT
DURASEAL SPINE SEALANT 5 POLY (MISCELLANEOUS) ×2 IMPLANT
ELECT BLADE 4.0 EZ CLEAN MEGAD (MISCELLANEOUS)
ELECT CAUTERY BLADE 6.4 (BLADE) ×3 IMPLANT
ELECT PENCIL ROCKER SW 15FT (MISCELLANEOUS) ×3 IMPLANT
ELECT REM PT RETURN 9FT ADLT (ELECTROSURGICAL) ×3
ELECTRODE BLDE 4.0 EZ CLN MEGD (MISCELLANEOUS) IMPLANT
ELECTRODE REM PT RTRN 9FT ADLT (ELECTROSURGICAL) ×1 IMPLANT
EVACUATOR SILICONE 100CC (DRAIN) ×2 IMPLANT
FLOSEAL 10ML (HEMOSTASIS) ×2 IMPLANT
GLOVE BIO SURGEON STRL SZ 6.5 (GLOVE) ×2 IMPLANT
GLOVE BIO SURGEONS STRL SZ 6.5 (GLOVE) ×1
GLOVE BIOGEL PI IND STRL 6.5 (GLOVE) ×1 IMPLANT
GLOVE BIOGEL PI IND STRL 8.5 (GLOVE) ×1 IMPLANT
GLOVE BIOGEL PI INDICATOR 6.5 (GLOVE) ×2
GLOVE BIOGEL PI INDICATOR 8.5 (GLOVE) ×2
GLOVE SS BIOGEL STRL SZ 8.5 (GLOVE) ×1 IMPLANT
GLOVE SUPERSENSE BIOGEL SZ 8.5 (GLOVE) ×4
GOWN STRL REUS W/ TWL LRG LVL3 (GOWN DISPOSABLE) ×1 IMPLANT
GOWN STRL REUS W/TWL 2XL LVL3 (GOWN DISPOSABLE) ×6 IMPLANT
GOWN STRL REUS W/TWL LRG LVL3 (GOWN DISPOSABLE) ×6
GRAFT BNE MATRIX VG FRMBL L 10 (Bone Implant) IMPLANT
GRAFT DURAGEN MATRIX 1WX1L (Tissue) ×2 IMPLANT
KIT BASIN OR (CUSTOM PROCEDURE TRAY) ×3 IMPLANT
NDL SPNL 18GX3.5 QUINCKE PK (NEEDLE) ×2 IMPLANT
NEEDLE 22X1 1/2 (OR ONLY) (NEEDLE) ×1 IMPLANT
NEEDLE SPNL 18GX3.5 QUINCKE PK (NEEDLE) ×6 IMPLANT
NS IRRIG 1000ML POUR BTL (IV SOLUTION) ×7 IMPLANT
PACK LAMINECTOMY ORTHO (CUSTOM PROCEDURE TRAY) ×3 IMPLANT
PACK UNIVERSAL I (CUSTOM PROCEDURE TRAY) ×3 IMPLANT
PATTIES SURGICAL .5 X.5 (GAUZE/BANDAGES/DRESSINGS) ×1 IMPLANT
PATTIES SURGICAL .5 X1 (DISPOSABLE) ×5 IMPLANT
RUBBERBAND STERILE (MISCELLANEOUS) IMPLANT
SPONGE LAP 4X18 X RAY DECT (DISPOSABLE) ×4 IMPLANT
SPONGE SURGIFOAM ABS GEL 100 (HEMOSTASIS) ×2 IMPLANT
STAPLER VISISTAT 35W (STAPLE) IMPLANT
SUT BONE WAX W31G (SUTURE) ×3 IMPLANT
SUT MON AB 3-0 SH 27 (SUTURE) ×3
SUT MON AB 3-0 SH27 (SUTURE) ×1 IMPLANT
SUT SILK 3 0 SH 30 (SUTURE) ×2 IMPLANT
SUT VIC AB 1 CT1 18XCR BRD 8 (SUTURE) ×1 IMPLANT
SUT VIC AB 1 CT1 27 (SUTURE)
SUT VIC AB 1 CT1 27XBRD ANTBC (SUTURE) IMPLANT
SUT VIC AB 1 CT1 8-18 (SUTURE) ×3
SUT VIC AB 1 CTX 18 (SUTURE) ×2 IMPLANT
SUT VIC AB 2-0 CT1 18 (SUTURE) ×8 IMPLANT
SUT VICRYL 0 UR6 27IN ABS (SUTURE) ×1 IMPLANT
SYR BULB IRRIGATION 50ML (SYRINGE) ×3 IMPLANT
SYR CONTROL 10ML LL (SYRINGE) ×5 IMPLANT
TOWEL GREEN STERILE (TOWEL DISPOSABLE) ×3 IMPLANT
TOWEL GREEN STERILE FF (TOWEL DISPOSABLE) ×3 IMPLANT
TRAY FOLEY W/METER SILVER 16FR (SET/KITS/TRAYS/PACK) ×2 IMPLANT
WATER STERILE IRR 1000ML POUR (IV SOLUTION) ×1 IMPLANT
YANKAUER SUCT BULB TIP NO VENT (SUCTIONS) ×2 IMPLANT

## 2017-12-17 NOTE — Anesthesia Preprocedure Evaluation (Signed)
Anesthesia Evaluation  Patient identified by MRN, date of birth, ID band Patient awake    Reviewed: Allergy & Precautions, NPO status , Patient's Chart, lab work & pertinent test results, reviewed documented beta blocker date and time   Airway Mallampati: II  TM Distance: >3 FB Neck ROM: Full    Dental  (+) Dental Advisory Given   Pulmonary neg pulmonary ROS,    breath sounds clear to auscultation       Cardiovascular hypertension, Pt. on medications and Pt. on home beta blockers + CAD and + Cardiac Stents   Rhythm:Regular Rate:Normal     Neuro/Psych Seizures -,  Anxiety Depression    GI/Hepatic Neg liver ROS, GERD  ,  Endo/Other  diabetes, Type 2Hypothyroidism   Renal/GU Renal disease     Musculoskeletal  (+) Arthritis ,   Abdominal   Peds  Hematology negative hematology ROS (+)   Anesthesia Other Findings   Reproductive/Obstetrics                             Lab Results  Component Value Date   WBC 7.0 12/14/2017   HGB 14.3 12/14/2017   HCT 43.5 12/14/2017   MCV 96.7 12/14/2017   PLT 330 12/14/2017   Lab Results  Component Value Date   CREATININE 1.23 (H) 12/14/2017   BUN 21 (H) 12/14/2017   NA 138 12/14/2017   K 4.2 12/14/2017   CL 105 12/14/2017   CO2 24 12/14/2017   Echo 02/05/17: Study Conclusions - Left ventricle: The cavity size was normal. Systolic function was normal. The estimated ejection fraction was in the range of 60% to 65%. Doppler parameters are consistent with abnormal left ventricular relaxation (grade 1 diastolic dysfunction). - Left atrium: The atrium was mildly to moderately dilated.  Nuclear stress test 09/07/14: Impression: 1.  Normal Lexiscan Myoview scan with no evidence of ischemia or infarction. 2.  Normal quantitative gated SPECT ejection fraction 67% with normal wall motion and wall thickening. Recommendations: No evidence of myocardial  ischemia noted.  Continue medical therapy.  Cardiac cath 08/28/04: IMPRESSION: 1. Successful stenting of the proximal left anterior descending with a drug-eluting stent. 2. Placement of two non-drug-eluting stents in the distal left anterior descending. 3. Minimal disease in the other vessels. 4. Normal left ventricular function.   Anesthesia Physical Anesthesia Plan  ASA: III  Anesthesia Plan: General   Post-op Pain Management:    Induction: Intravenous  PONV Risk Score and Plan: 3 and Ondansetron, Dexamethasone and Treatment may vary due to age or medical condition  Airway Management Planned: Oral ETT  Additional Equipment:   Intra-op Plan:   Post-operative Plan: Extubation in OR  Informed Consent: I have reviewed the patients History and Physical, chart, labs and discussed the procedure including the risks, benefits and alternatives for the proposed anesthesia with the patient or authorized representative who has indicated his/her understanding and acceptance.   Dental advisory given  Plan Discussed with: CRNA  Anesthesia Plan Comments:         Anesthesia Quick Evaluation

## 2017-12-17 NOTE — Transfer of Care (Signed)
Immediate Anesthesia Transfer of Care Note  Patient: Wendy Roth  Procedure(s) Performed: LUMBAR L3-S1 DECOMPRESSION WITH IN SITU FUSION (N/A )  Patient Location: PACU  Anesthesia Type:General  Level of Consciousness: drowsy and patient cooperative  Airway & Oxygen Therapy: Patient Spontanous Breathing and Patient connected to nasal cannula oxygen  Post-op Assessment: Report given to RN and Post -op Vital signs reviewed and stable  Post vital signs: Reviewed and stable  Last Vitals:  Vitals:   12/17/17 0920 12/17/17 1545  BP: 132/73   Pulse: 66   Resp: 20   Temp: 36.6 C (!) 36.2 C  SpO2: 95%     Last Pain:  Vitals:   12/17/17 0947  TempSrc:   PainSc: 3          Complications: No apparent anesthesia complications

## 2017-12-17 NOTE — Anesthesia Procedure Notes (Signed)
Procedure Name: Intubation Date/Time: 12/17/2017 11:13 AM Performed by: Raenette Rover, CRNA Pre-anesthesia Checklist: Patient identified, Emergency Drugs available, Suction available and Patient being monitored Patient Re-evaluated:Patient Re-evaluated prior to induction Oxygen Delivery Method: Circle system utilized Preoxygenation: Pre-oxygenation with 100% oxygen Induction Type: IV induction Ventilation: Mask ventilation without difficulty Laryngoscope Size: Mac and 3 Grade View: Grade I Tube type: Oral Tube size: 7.0 mm Number of attempts: 1 Airway Equipment and Method: Stylet Placement Confirmation: ETT inserted through vocal cords under direct vision,  positive ETCO2,  CO2 detector and breath sounds checked- equal and bilateral Secured at: 21 cm Tube secured with: Tape Dental Injury: Teeth and Oropharynx as per pre-operative assessment

## 2017-12-17 NOTE — Brief Op Note (Signed)
12/17/2017  3:08 PM  PATIENT:  Wendy Roth  84 y.o. female  PRE-OPERATIVE DIAGNOSIS:  Spinal stenosis lumbar L3-S1  POST-OPERATIVE DIAGNOSIS:  Spinal stenosis lumbar L3-S1  PROCEDURE:  Procedure(s): LUMBAR L3-S1 DECOMPRESSION WITH IN SITU FUSION (N/A)  SURGEON:  Surgeon(s) and Role:    Melina Schools, MD - Primary  PHYSICIAN ASSISTANT:   ASSISTANTS: Carmen Mayo   ANESTHESIA:   general  EBL:  300 mL   BLOOD ADMINISTERED:none  DRAINS: 1 JP in the back   LOCAL MEDICATIONS USED:  MARCAINE     SPECIMEN:  No Specimen  DISPOSITION OF SPECIMEN:  N/A  COUNTS:  YES  TOURNIQUET:  * No tourniquets in log *  DICTATION: .Dragon Dictation  PLAN OF CARE: Admit to inpatient   PATIENT DISPOSITION:  PACU - hemodynamically stable.

## 2017-12-17 NOTE — Op Note (Signed)
Operative report  Preoperative diagnosis: Severe lumbar spinal stenosis with degenerative spondylolisthesis L3 through S1.  Positive neurogenic claudication.  Postoperative diagnosis: Same  Operative report:  1. posterior lumbar decompression L3 through S1    2.  In situ fusion L3 through S1  First assistant [facility]  Complications.  Dural rent at L4-5 questionable CSF leak.  No active leak with Valsalva to 40 mmHg for 10 seconds.  Still placed DuraSeal over that area as well as a drain.  Indications: This is a very pleasant active 84 year old woman with severe back buttock and radicular leg pain right side worse than the left.  Patient's clinical exam consistent with lumbar spinal stenosis with neurogenic claudication.  Imaging studies demonstrated severe spinal stenosis with significant neural compression in the lumbar spine.  In addition the patient had degenerative spondylolisthesis L4-5 L5-S1.  After attempts at conservative management failed to alleviate her symptoms we elected to proceed with surgery.  All appropriate risks benefits and alternatives were discussed with the patient and consent was obtained.  Operative note patient was brought the operating room placed upon the operating table.  After successful induction of general anesthesia and endotracheally patient teds SCDs and a Foley were inserted.  Patient was turned prone onto the Wilson frame and all bony prominences well-padded.  The back was then prepped and draped in a standard fashion.  Timeout was taken to confirm patient procedure and all other important data.  X-rays were used to identify the incision site was marked out.  I infiltrated the incision site with quarter percent Marcaine with epinephrine.  Midline incision was made starting at the midportion of L3 and proceeding down to the inferior aspect of S1.  Sharp dissection was carried out down to the deep fascia.  The deep fascia was sharply incised and I stripped the  paraspinal muscles to expose the L3-4 and 5 spinous process and lamina bilaterally.  There was significant facet arthrosis thickening of the ligamentum flavum as well as some calcifications of the ligamentum flavum throughout this area of the lumbar spine.  A Penfield 4 was placed under the L5 lamina and a second x-ray was taken to confirm the level.  Once this was done I proceeded with exposing the posterior lateral gutter.  Retractors were placed and I dissected over the lateral aspect of the L to 3 facet complex keeping the facet capsule intact.  I then identified the L3 transverse process and exposed it.  I then proceeded to the L3-4 and 4 5 facet complexes and exposed the transverse processes.  I also took down the facet capsule at these levels to facilitate the fusion.  Once I had the posterior lateral gutter exposed from L3 down to the sacral ala I then went to the contralateral side and exposed on that side.  Once both sides were exposed I then proceeded with the decompression  I remove the posterior elements of L5 L4 and L3.  Spinous process was removed.  I then gently began dissecting away the very thickened hypertrophic ligamentum flavum.  This was very tedious given the thickening and significant mass of the material.  Using a Penfield 4 and [facility] I gently dissected through and then used my 2 and 3 mm, Kerrison punch to remove the ligamentum flavum.  Eventually was able to expose the thecal sac posteriorly at L5-S1.  I then exposed similarly in the central region at L4-5.  Using the same technique I performed a laminotomy of L3.  At this point I had the central lamina partially decompressed at each level.  This allowed me to establish my depth.  I began gently dissecting either side of the lamina at L3-4 until I could pass a neuro patty underneath the lamina at its entirety with the thecal sac protected I was able to do and sacrificed the remaining portion of the L4 lamina and complete  complete the laminectomy adequately decompressing the L3-4 level.  With the laminectomy completed of L4 I can now work into the lateral recess.  Since the right side was more painful I did do more extensive work down on that right side.  At the L4-5 level there was significant mass-effect on the thecal sac in the posterior lateral right recess.  I gently dissected using a Penfield 4 to separate the ligamentum flavum from the thecal sac and I resected this with a 2 mm Kerrison punch.  I noted that the significant scar tissue that was very adherent to the thecal sac.  Using gentle dissection I was able to develop my plane underneath the bone creating a space between the thecal sac and the bone.  I used my 1 mm and 2 mm Kerrison punch to affect the posterior lateral recess decompression.  At this point I now had from L3 down to L5 decompressed on the right side.  A similar large scar mass was seen on the left side.  As I gently dissected through this I noted a rent in the CSF in the lateral recess.  The volume of the thecal sac and not diminished but I was still concerned about a potential CSF leak given that rent.  Because the ligamentum flavum was so adherent to the thecal sac I could not develop a plane safely without causing a very significant dural tear.  I elected to resect the overlying bone thereby allowing that scar mass to essentially free-floating.  Similar findings were in the right lateral recess and left lateral recess at L5-S1 but again I was able to perform a central decompression at L5 by resecting the entire lamina and getting out as far lateral as I could without endangering the neural elements.  At this point I had adequately decompressed from L3-S1 centrally and in the lateral reces.  Although there was still significant thickening of the ligamentum flavum in the left lateral recess and posterior lateral area at L4-5 it was not significantly compressed and elements as it now was completely  decompressed.  At this point I could easily pass my [facility] elevator superiorly towards the L3 foramen down along the lateral recess and in all the way into the S1 foramen.  Able to do this on both sides.  At this point I was pleased with the overall decompression.  I then exposed the transverse process again and decorticated with a high-speed bur and then placed the bone graft into the lateral recess.  Bone graft consisted of autograft from the decompression mixed with vivogen.  Once this was in place I placed a deep drain through a separate stab incision.  I then irrigated the wound copiously normal saline and made sure hemostasis using bipolar cautery and FloSeal.  I then performed a Valsalva to 40 mmHg and held it for 10 seconds.  There was no active CSF leak noted.  At the end of the case the volume of the thecal sac was actually improved and there was no evidence of an active leak.  To be on the safe side I still  placed a DuraGen sealant in the left lateral gutter.  I then placed a thrombin-soaked Gelfoam patty over the entire laminotomy defect and then closed in a layered fashion with interrupted #1 Vicryl suture, 2-0 Vicryl suture, and 3-0 Monocryl.  Steri-Strips dry dressing were applied and the patient was ultimately extubated transferred the PACU without incident.  The end of the case all needle sponge counts were correct.

## 2017-12-17 NOTE — Plan of Care (Signed)
  Progressing Safety: Ability to remain free from injury will improve 12/17/2017 2008 - Progressing by Pricilla Holm, Janee Ureste D, RN Activity: Ability to avoid complications of mobility impairment will improve 12/17/2017 2008 - Progressing by Pricilla Holm, Roxann Vierra D, RN Ability to tolerate increased activity will improve 12/17/2017 2008 - Progressing by Charlena Cross, RN Will remain free from falls 12/17/2017 2008 - Progressing by Margot Chimes D, RN Bowel/Gastric: Gastrointestinal status for postoperative course will improve 12/17/2017 2008 - Progressing by Charlena Cross, RN Education: Ability to verbalize activity precautions or restrictions will improve 12/17/2017 2008 - Progressing by Margot Chimes D, RN Knowledge of the prescribed therapeutic regimen will improve 12/17/2017 2008 - Progressing by Margot Chimes D, RN Understanding of discharge needs will improve 12/17/2017 2008 - Progressing by Charlena Cross, RN Physical Regulation: Ability to maintain clinical measurements within normal limits will improve 12/17/2017 2008 - Progressing by Pricilla Holm, Jammal Sarr D, RN Postoperative complications will be avoided or minimized 12/17/2017 2008 - Progressing by Margot Chimes D, RN Diagnostic test results will improve 12/17/2017 2008 - Progressing by Margot Chimes D, RN Pain Management: Pain level will decrease 12/17/2017 2008 - Progressing by Charlena Cross, RN Skin Integrity: Signs of wound healing will improve 12/17/2017 2008 - Progressing by Margot Chimes D, RN Health Behavior/Discharge Planning: Identification of resources available to assist in meeting health care needs will improve 12/17/2017 2008 - Progressing by Margot Chimes D, RN Bladder/Genitourinary: Urinary functional status for postoperative course will improve 12/17/2017 2008 - Progressing by Charlena Cross, RN

## 2017-12-18 ENCOUNTER — Other Ambulatory Visit: Payer: Self-pay

## 2017-12-18 ENCOUNTER — Encounter (HOSPITAL_COMMUNITY): Payer: Self-pay | Admitting: Orthopedic Surgery

## 2017-12-18 LAB — GLUCOSE, CAPILLARY
GLUCOSE-CAPILLARY: 116 mg/dL — AB (ref 65–99)
GLUCOSE-CAPILLARY: 130 mg/dL — AB (ref 65–99)
Glucose-Capillary: 127 mg/dL — ABNORMAL HIGH (ref 65–99)
Glucose-Capillary: 136 mg/dL — ABNORMAL HIGH (ref 65–99)

## 2017-12-18 NOTE — Progress Notes (Signed)
Nurse in med room getting meds when called to patient's room and found patient sitting on the floor at the bedside. Assessed and assisted back to bed. No apparent injury noted. Patient denies any pain or discomfort. States she was trying to go to the bathroom. Reminded to call for help and wait till someone is in the room with her. MD notified as well as husband. Patient moved from room 3C08 to room 07 for closer supervision.

## 2017-12-18 NOTE — Progress Notes (Signed)
    Subjective: Procedure(s) (LRB): LUMBAR L3-S1 DECOMPRESSION WITH IN SITU FUSION (N/A) 1 Day Post-Op  Patient reports pain as 4 on 0-10 scale.  Reports decreased leg pain reports incisional back pain   Positive void Negative bowel movement Positive flatus Negative chest pain or shortness of breath  Objective: Vital signs in last 24 hours: Temp:  [97.2 F (36.2 C)-98.6 F (37 C)] 98.1 F (36.7 C) (03/15 0806) Pulse Rate:  [67-90] 74 (03/15 0806) Resp:  [11-20] 16 (03/15 0806) BP: (113-146)/(53-73) 113/53 (03/15 0806) SpO2:  [93 %-99 %] 98 % (03/15 0806) FiO2 (%):  [2 %] 2 % (03/15 0806)  Intake/Output from previous day: 03/14 0701 - 03/15 0700 In: 2350 [P.O.:80; I.V.:2020; IV Piggyback:250] Out: 1240 [Urine:605; Drains:335; Blood:300]  Labs: No results for input(s): WBC, RBC, HCT, PLT in the last 72 hours. No results for input(s): NA, K, CL, CO2, BUN, CREATININE, GLUCOSE, CALCIUM in the last 72 hours. No results for input(s): LABPT, INR in the last 72 hours.  Physical Exam: Neurologically intact ABD soft Intact pulses distally Incision: dressing C/D/I Compartment soft no headaches when ambulating Body mass index is 30.66 kg/m.    Assessment/Plan: Patient stable  xrays n/a Continue mobilization with physical therapy Continue care  Advance diet Up with therapy  Stable - no evidence of CSF leak Overall pleased with recovery - continue drain - will re-eval in AM  Melina Schools, MD [place] [phone]

## 2017-12-18 NOTE — Evaluation (Signed)
Occupational Therapy Evaluation Patient Details Name: Wendy Roth MRN: [id number] DOB: 20-Feb-1932 Today's Date: 12/18/2017    History of Present Illness Pt is an 84 y/o female s/p LUMBAR L3-S1 DECOMPRESSION WITH IN SITU FUSION; PMhx includes breast cancer, DM, HTN, CAD, anxiety, carpal tunnel    Clinical Impression   This 84 y/o F presents with the above. Pt lives with spouse in ALF, reports being mod independent with ADLs and functional mobility at baseline. Pt completing room level functional mobility at RW level with MinA this session; currently requires MinA for LB ADLs using AE, requiring mod verbal cues for technique; min verbal cues required for adhering to back precautions this session. Education provided on back precautions, AE, safety and compensatory techniques for completing ADLs and functional transfers with pt verbalizing/return demonstrating understanding during session. Pt will benefit from continued acute OT services and recommend SNF level services at pt's facility prior to return to ALF to maximize her overall safety and independence with ADLs and mobility.     Follow Up Recommendations  SNF;Supervision/Assistance - 24 hour    Equipment Recommendations  Other (comment)(TBD in next venue )           Precautions / Restrictions Precautions Precautions: Back Precaution Booklet Issued: Yes (comment) Precaution Comments: reviewed with pt; pt able to recall 2/3 back precautions start of session  Required Braces or Orthoses: Spinal Brace Spinal Brace: Lumbar corset;Applied in sitting position Restrictions Weight Bearing Restrictions: No      Mobility Bed Mobility Overal bed mobility: Needs Assistance Bed Mobility: Sit to Sidelying;Rolling Rolling: Min guard       Sit to sidelying: Mod assist General bed mobility comments: assist for LEs to return to sidelying, verbal cues for proper log roll technique   Transfers Overall transfer level: Needs  assistance Equipment used: Rolling walker (2 wheeled) Transfers: Sit to/from Stand Sit to Stand: Min assist         General transfer comment: assist to rise and steady at RW     Balance Overall balance assessment: Needs assistance Sitting-balance support: Feet supported;No upper extremity supported Sitting balance-Leahy Scale: Good     Standing balance support: Bilateral upper extremity supported Standing balance-Leahy Scale: Poor Standing balance comment: reliant on UE support for dynamic mobility                            ADL either performed or assessed with clinical judgement   ADL Overall ADL's : Needs assistance/impaired Eating/Feeding: Supervision/ safety;Sitting   Grooming: Minimal assistance;Standing   Upper Body Bathing: Min guard;Sitting   Lower Body Bathing: Minimal assistance;Sit to/from stand;Cueing for back precautions;With adaptive equipment   Upper Body Dressing : Minimal assistance;Sitting   Lower Body Dressing: Minimal assistance;Sit to/from stand;Cueing for back precautions;With adaptive equipment Lower Body Dressing Details (indicate cue type and reason): educated pt on use of reacher with pt return demonstrating with min verbal cues; verbally educated on use of sock aide, pt will benefit from additional practice/review  Toilet Transfer: Minimal assistance;Ambulation;Regular Toilet;Grab bars;RW   Toileting- Clothing Manipulation and Hygiene: Minimal assistance;Sit to/from stand       Functional mobility during ADLs: Minimal assistance;Rolling walker General ADL Comments: education provided on back precautions, AE, safety and compensatory techniques for completing ADLs and functional transfers                          Pertinent Vitals/Pain Pain Assessment: Faces Faces  Pain Scale: Hurts even more Pain Location: lower back  Pain Descriptors / Indicators: Grimacing;Sore Pain Intervention(s): Monitored during session;Repositioned      Extremity/Trunk Assessment Upper Extremity Assessment Upper Extremity Assessment: Overall WFL for tasks assessed   Lower Extremity Assessment Lower Extremity Assessment: Defer to PT evaluation   Cervical / Trunk Assessment Cervical / Trunk Assessment: Other exceptions Cervical / Trunk Exceptions: s/p spinal surgery    Communication Communication Communication: No difficulties   Cognition Arousal/Alertness: Awake/alert Behavior During Therapy: WFL for tasks assessed/performed Overall Cognitive Status: No family/caregiver present to determine baseline cognitive functioning                                 General Comments: pt with decreased recall of precautions start of session, requires min verbal safety cues, intermittently requires increased processing time to follow instructions    General Comments                  Home Living Family/patient expects to be discharged to:: Assisted living Living Arrangements: Spouse/significant other                           Home Equipment: Walker - 2 wheels;Shower seat          Prior Functioning/Environment Level of Independence: Independent with assistive device(s)        Comments: pt reports using RW at home; reports recent falls at home (unclear as to how many)        OT Problem List: Impaired balance (sitting and/or standing);Decreased activity tolerance;Decreased knowledge of use of DME or AE;Decreased knowledge of precautions      OT Treatment/Interventions: Self-care/ADL training;DME and/or AE instruction;Therapeutic activities;Balance training;Therapeutic exercise;Energy conservation;Patient/family education    OT Goals(Current goals can be found in the care plan section) Acute Rehab OT Goals Patient Stated Goal: return home; regain independence  OT Goal Formulation: With patient Time For Goal Achievement: 01/01/18 Potential to Achieve Goals: Good  OT Frequency: Min 2X/week                              AM-PAC PT "6 Clicks" Daily Activity     Outcome Measure Help from another person eating meals?: None Help from another person taking care of personal grooming?: A Little Help from another person toileting, which includes using toliet, bedpan, or urinal?: A Little Help from another person bathing (including washing, rinsing, drying)?: A Lot Help from another person to put on and taking off regular upper body clothing?: A Little Help from another person to put on and taking off regular lower body clothing?: A Lot 6 Click Score: 17   End of Session Equipment Utilized During Treatment: Gait belt;Rolling walker Nurse Communication: Mobility status  Activity Tolerance: Patient tolerated treatment well Patient left: in bed;with call bell/phone within reach  OT Visit Diagnosis: Other abnormalities of gait and mobility (R26.89);Pain Pain - part of body: (back )                Time: 3382-5053 OT Time Calculation (min): 27 min Charges:  OT General Charges $OT Visit: 1 Visit OT Evaluation $OT Eval Low Complexity: 1 Low OT Treatments $Self Care/Home Management : 8-22 mins G-Codes:     Lou Cal, OT Pager [phone] 12/18/2017   Raymondo Band 12/18/2017, 10:27 AM

## 2017-12-18 NOTE — Anesthesia Postprocedure Evaluation (Signed)
Anesthesia Post Note  Patient: Wendy Roth  Procedure(s) Performed: LUMBAR L3-S1 DECOMPRESSION WITH IN SITU FUSION (N/A )     Patient location during evaluation: PACU Anesthesia Type: General Level of consciousness: awake and alert, oriented and awake Pain management: pain level controlled Vital Signs Assessment: post-procedure vital signs reviewed and stable Respiratory status: spontaneous breathing, nonlabored ventilation, respiratory function stable and patient connected to nasal cannula oxygen Cardiovascular status: blood pressure returned to baseline and stable Postop Assessment: no apparent nausea or vomiting Anesthetic complications: no    Last Vitals:  Vitals:   12/17/17 2315 12/18/17 0328  BP: (!) 132/59 123/64  Pulse: 77 73  Resp: 16 16  Temp: 36.9 C 37 C  SpO2: 98% 95%    Last Pain:  Vitals:   12/18/17 0328  TempSrc: Oral  PainSc:                  Catalina Gravel

## 2017-12-18 NOTE — Evaluation (Signed)
Physical Therapy Evaluation Patient Details Name: ANGELL PINCOCK MRN: [id number] DOB: [birth year] Today's Date: 12/18/2017   History of Present Illness  Pt is an 84 y/o female s/p LUMBAR L3-S1 DECOMPRESSION WITH IN SITU FUSION; PMhx includes breast cancer, DM, HTN, CAD, anxiety, carpal tunnel   Clinical Impression  Pt admitted with above diagnosis. Pt currently with functional limitations due to the deficits listed below (see PT Problem List). At the time of PT eval pt was able to perform transfers and ambulation with gross min guard assist to min assist for balance support and safety. Apparently, husband's plan is for pt to go to skilled section of [facility] prior to return to her independent living apartment with him and reports to staff that he cannot care for her at home. Pt states she was not aware of this, but is agreeable to continued rehab at d/c. Acutely, pt will benefit from skilled PT to increase their independence and safety with mobility to allow discharge to the venue listed below.       Follow Up Recommendations SNF;Supervision/Assistance - 24 hour    Equipment Recommendations  Rolling walker with 5" wheels(youth)    Recommendations for Other Services       Precautions / Restrictions Precautions Precautions: Back Precaution Booklet Issued: Yes (comment) Precaution Comments: reviewed with pt; pt able to recall 2/3 back precautions start of session  Required Braces or Orthoses: Spinal Brace Spinal Brace: Lumbar corset;Applied in sitting position Restrictions Weight Bearing Restrictions: No      Mobility  Bed Mobility Overal bed mobility: Needs Assistance Bed Mobility: Rolling;Sidelying to Sit Rolling: Supervision Sidelying to sit: Min guard     Sit to sidelying: Mod assist General bed mobility comments: VC's for proper log roll technique. Pt required hands on tactile cues for proper positioning.   Transfers Overall transfer level: Needs  assistance Equipment used: Rolling walker (2 wheeled) Transfers: Sit to/from Stand Sit to Stand: Min assist         General transfer comment: VC's for hand placement on seated surface for safety. Pt was able to power-up to full standing position with min assist for balance support and safety.   Ambulation/Gait Ambulation/Gait assistance: Min guard Ambulation Distance (Feet): 225 Feet Assistive device: Rolling walker (2 wheeled) Gait Pattern/deviations: Step-through pattern;Decreased stride length;Trunk flexed Gait velocity: Decreased Gait velocity interpretation: Below normal speed for age/gender General Gait Details: VC's for improved posture and general safety with RW. Pt moving slow and guarded. Would benefit from a youth RW - pt has a RW at home but unsure if it will accommodate her height.   Stairs            Wheelchair Mobility    Modified Rankin (Stroke Patients Only)       Balance Overall balance assessment: Needs assistance Sitting-balance support: Feet supported;No upper extremity supported Sitting balance-Leahy Scale: Good     Standing balance support: Bilateral upper extremity supported Standing balance-Leahy Scale: Poor Standing balance comment: reliant on UE support for dynamic mobility                              Pertinent Vitals/Pain Pain Assessment: Faces Faces Pain Scale: Hurts even more Pain Location: lower back  Pain Descriptors / Indicators: Grimacing;Sore Pain Intervention(s): Monitored during session    Home Living Family/patient expects to be discharged to:: Assisted living Living Arrangements: Spouse/significant other Available Help at Discharge: Family;Available 24 hours/day Type of Home: Apartment  Home Access: Elevator;Level entry     Home Layout: One level Home Equipment: [place] - 2 wheels;Shower seat      Prior Function Level of Independence: Independent with assistive device(s)         Comments: pt reports  using RW at home; reports recent falls at home (unclear as to how many)     Hand Dominance        Extremity/Trunk Assessment   Upper Extremity Assessment Upper Extremity Assessment: Defer to OT evaluation    Lower Extremity Assessment Lower Extremity Assessment: Generalized weakness    Cervical / Trunk Assessment Cervical / Trunk Assessment: Other exceptions Cervical / Trunk Exceptions: s/p spinal surgery   Communication   Communication: No difficulties  Cognition Arousal/Alertness: Awake/alert Behavior During Therapy: WFL for tasks assessed/performed Overall Cognitive Status: No family/caregiver present to determine baseline cognitive functioning                                 General Comments: pt with decreased recall of precautions start of session, requires min verbal safety cues, intermittently requires increased processing time to follow instructions       General Comments      Exercises     Assessment/Plan    PT Assessment Patient needs continued PT services  PT Problem List Decreased strength;Decreased range of motion;Decreased activity tolerance;Decreased balance;Decreased mobility;Decreased knowledge of use of DME;Decreased safety awareness;Decreased knowledge of precautions;Pain       PT Treatment Interventions DME instruction;Gait training;Stair training;Functional mobility training;Therapeutic activities;Therapeutic exercise;Neuromuscular re-education;Patient/family education    PT Goals (Current goals can be found in the Care Plan section)  Acute Rehab PT Goals Patient Stated Goal: return home; regain independence  PT Goal Formulation: With patient Time For Goal Achievement: 01/01/18 Potential to Achieve Goals: Good    Frequency Min 5X/week   Barriers to discharge        Co-evaluation               AM-PAC PT "6 Clicks" Daily Activity  Outcome Measure Difficulty turning over in bed (including adjusting bedclothes, sheets  and blankets)?: A Little Difficulty moving from lying on back to sitting on the side of the bed? : A Little Difficulty sitting down on and standing up from a chair with arms (e.g., wheelchair, bedside commode, etc,.)?: A Little Help needed moving to and from a bed to chair (including a wheelchair)?: A Little Help needed walking in hospital room?: A Little Help needed climbing 3-5 steps with a railing? : A Lot 6 Click Score: 17    End of Session Equipment Utilized During Treatment: Gait belt Activity Tolerance: Patient tolerated treatment well Patient left: in chair;with call bell/phone within reach Nurse Communication: Mobility status PT Visit Diagnosis: Unsteadiness on feet (R26.81);Pain;Other symptoms and signs involving the nervous system (R29.898) Pain - part of body: (back)    Time: 6160-7371 PT Time Calculation (min) (ACUTE ONLY): 31 min   Charges:   PT Evaluation $PT Eval Moderate Complexity: 1 Mod PT Treatments $Gait Training: 8-22 mins   PT G Codes:        Rolinda Roan, PT, DPT Acute Rehabilitation Services Pager: [phone]   Thelma Comp 12/18/2017, 11:15 AM

## 2017-12-19 LAB — GLUCOSE, CAPILLARY
GLUCOSE-CAPILLARY: 160 mg/dL — AB (ref 65–99)
GLUCOSE-CAPILLARY: 154 mg/dL — AB (ref 65–99)
Glucose-Capillary: 156 mg/dL — ABNORMAL HIGH (ref 65–99)
Glucose-Capillary: 157 mg/dL — ABNORMAL HIGH (ref 65–99)

## 2017-12-19 MED ORDER — TRAMADOL HCL 50 MG PO TABS: 50.0000 mg | ORAL_TABLET | Freq: Four times a day (QID) | ORAL | 0 refills | Status: DC | PRN

## 2017-12-19 NOTE — Discharge Summary (Addendum)
Physician Discharge Summary  Patient ID: Wendy Roth MRN: [id number] DOB/AGE: 02/06/1932 84 y.o.  Admit date: 12/17/2017 Discharge date: 12/20/17  Admission Diagnoses:  Lumbar Spinal Stenosis  Discharge Diagnoses:  Active Problems:   Status post lumbar spine operative procedure for decompression of spinal cord   Past Medical History:  Diagnosis Date  . Anxiety   . Arthritis   . Back pain   . Breast cancer ([place])   . Carpal tunnel syndrome   . Coronary artery disease 2005   stent placed  . Depression   . Diabetes mellitus without complication (HCC)    diet controlled.   . Fatigue   . GERD (gastroesophageal reflux disease)    "years ago" but still takes Prilosec  . Glaucoma    left eye  . History of kidney stones   . Hypertension   . Hypothyroidism   . Restless leg syndrome   . Seizures ([place])    as a baby    Surgeries: Procedure(s): LUMBAR L3-S1 DECOMPRESSION WITH IN SITU FUSION on 12/17/2017   Consultants (if any):   Discharged Condition: Improved  Hospital Course: Wendy Roth is an 84 y.o. female who was admitted 12/17/2017 with a diagnosis of Lumbar Spinal Stenosis and went to the operating room on 12/17/2017 and underwent the above named procedures.  Post op day 2 pt reports mild pain controlled on oral medication.  Pt unfortunately had a fall last evening.  PT and the family would like the pt to return to the [facility] at a higher level of care and then work on rehab and transition to the care she was receiving pre-op.  Patient has had some issues with confusion starting on post op day two going into post op day three. She becomes confused about where she is and has seen things and people in her room. She can easily be reoriented. She does have some history of confusion per the husband but he reports that it has been worse since surgery. He reports that he would prefer to get her back to [facility] so that she is familiar with her  surroundings. UA was done to rule out UTI. No infection present.     She was given perioperative antibiotics:  Anti-infectives (From admission, onward)   Start     Dose/Rate Route Frequency Ordered Stop   12/17/17 1900  ceFAZolin (ANCEF) IVPB 2g/100 mL premix     2 g 200 mL/hr over 30 Minutes Intravenous Every 8 hours 12/17/17 1746 12/18/17 0248   12/17/17 0944  ceFAZolin (ANCEF) 2-4 GM/100ML-% IVPB    Comments:  Hogue, Samantha   : cabinet override      12/17/17 0944 12/17/17 1116   12/17/17 0942  ceFAZolin (ANCEF) IVPB 2g/100 mL premix     2 g 200 mL/hr over 30 Minutes Intravenous 30 min pre-op 12/17/17 0942 12/17/17 1116    .  She was given sequential compression devices, early ambulation, and TED for DVT prophylaxis.  She benefited maximally from the hospital stay and there were no complications.    Recent vital signs:  Vitals:   12/19/17 0346 12/19/17 0752  BP: 130/63 129/87  Pulse: 92 74  Resp: 16 18  Temp: 98.5 F (36.9 C) 99.2 F (37.3 C)  SpO2: 94% 93%    Recent laboratory studies:  Lab Results  Component Value Date   HGB 14.3 12/14/2017   HGB 13.1 02/08/2017   HGB 14.1 02/07/2017   Lab Results  Component Value Date  WBC 7.0 12/14/2017   PLT 330 12/14/2017   Lab Results  Component Value Date   INR 1.09 02/04/2017   Lab Results  Component Value Date   NA 138 12/14/2017   K 4.2 12/14/2017   CL 105 12/14/2017   CO2 24 12/14/2017   BUN 21 (H) 12/14/2017   CREATININE 1.23 (H) 12/14/2017   GLUCOSE 122 (H) 12/14/2017    Discharge Medications:   Allergies as of 12/19/2017      Reactions   Cortisone Shortness Of Breath   She blames symptoms of SOB & palpitations on shot of cortisone.      Medication List    STOP taking these medications     aspirin EC 81 MG tablet   HYDROcodone-acetaminophen 5-325 MG tablet Commonly known as:  NORCO/VICODIN     TAKE these medications   atorvastatin 20 MG tablet Commonly known as:  LIPITOR Take 20 mg by  mouth at bedtime.   Baclofen 5 MG Tabs Take 5 mg by mouth 2 (two) times daily. (0800 & 2000)   buPROPion 300 MG 24 hr tablet Commonly known as:  WELLBUTRIN XL Take 300 mg by mouth daily.   cetirizine 10 MG tablet Commonly known as:  ZYRTEC Take 10 mg by mouth daily.   clonazePAM 1 MG tablet Commonly known as:  KLONOPIN Take 1 mg by mouth 2 (two) times daily as needed for anxiety (scheduled daily at 1100). 1100   diltiazem 120 MG 24 hr capsule Commonly known as:  CARDIZEM CD Take 1 capsule (120 mg total) by mouth daily. What changed:  when to take this   donepezil 5 MG tablet Commonly known as:  ARICEPT Take 5 mg by mouth every evening.   DULoxetine 60 MG capsule Commonly known as:  CYMBALTA Take 120 mg by mouth daily.   famotidine 20 MG tablet Commonly known as:  PEPCID Take 20 mg by mouth every evening.   fluticasone 50 MCG/ACT nasal spray Commonly known as:  FLONASE Place 1 spray into both nostrils daily. What changed:    when to take this  reasons to take this   hydrocortisone-pramoxine 2.5-1 % rectal cream Commonly known as:  ANALPRAM-HC Place 1 application rectally 2 (two) times daily as needed for hemorrhoids or anal itching.   latanoprost 0.005 % ophthalmic solution Commonly known as:  XALATAN Place 1 drop into both eyes at bedtime.   letrozole 2.5 MG tablet Commonly known as:  FEMARA TAKE 1 TABLET DAILY What changed:    how much to take  how to take this  when to take this   levothyroxine 75 MCG tablet Commonly known as:  SYNTHROID, LEVOTHROID Take 75 mcg by mouth daily before breakfast.   lisinopril 20 MG tablet Commonly known as:  PRINIVIL,ZESTRIL Take 20 mg by mouth daily.   magnesium oxide 400 (241.3 Mg) MG tablet Commonly known as:  MAG-OX Take 1 tablet (400 mg total) by mouth daily.   metoprolol succinate 50 MG 24 hr tablet Commonly known as:  TOPROL-XL Take 50 mg by mouth every evening. Take with or immediately following a  meal.   omeprazole 40 MG capsule Commonly known as:  PRILOSEC Take 40 mg by mouth daily.   ondansetron 4 MG disintegrating tablet Commonly known as:  ZOFRAN ODT Take 1 tablet (4 mg total) by mouth every 8 (eight) hours as needed for nausea or vomiting.   polyethylene glycol powder powder Commonly known as:  GLYCOLAX/MIRALAX Take 17 g by mouth every other day.  And daily as needed for constipation.   rOPINIRole 0.25 MG tablet Commonly known as:  REQUIP Take 0.5 mg by mouth at bedtime.   sennosides-docusate sodium 8.6-50 MG tablet Commonly known as:  SENOKOT-S Take 1 tablet by mouth daily.   acetaminophen 500 MG tablet Commonly known as:  TYLENOL Take 500mg  1-2 tablets every 8 hours PRN pain   vitamin B-12 1000 MCG tablet Commonly known as:  CYANOCOBALAMIN Take 1,000 mcg by mouth daily.       Diagnostic Studies: Dg Lumbar Spine 2-3 Views  Result Date: 12/17/2017 CLINICAL DATA:  Localization for L3-S1 decompression. EXAM: LUMBAR SPINE - 2-3 VIEW COMPARISON:  Abdominal CT 09/17/2017 FINDINGS: No preoperative spinal imaging. On comparison abdominal CT there are 5 lumbar type vertebral bodies below the small twelfth ribs. These are the true twelfth ribs based on a chest CT in 2018. Two films were submitted. The first shows spinal needles and the second shows a retractor with probe centered over the L5-S1 interspinous space. Advanced lower lumbar disc degeneration. L4-5 and L5-S1 anterolisthesis. These results were called by telephone at the time of interpretation on 12/17/2017 at 12:13 pm to OR 4 circulator who verbally acknowledged these results. IMPRESSION: A surgical probe projects over the L5-S1 interspinous space. Electronically Signed   By: Monte Fantasia M.D.   On: 12/17/2017 12:17    Disposition: Discharge disposition: 03-Skilled Nursing Facility      Medications on chart Pt will present to clinic in 2 weeks for wound check Discharge Instructions    Incentive  spirometry RT   Complete by:  As directed       Follow-up Information    Melina Schools, MD Follow up in 2 week(s).   Specialty:  Orthopedic Surgery Contact information: [address] [place] [address] [phone]            Signed: Ardeen Jourdain, PA-C

## 2017-12-19 NOTE — Progress Notes (Signed)
Patient is transferred from room 3C07 to unit 3W26 at this time. Alert and in stable condition. Report given to receiving nurse Mickel Baas, RN with all questions answered. Left unit via bed with all belongings at side.

## 2017-12-19 NOTE — Progress Notes (Signed)
    Subjective: Procedure(s) (LRB): LUMBAR L3-S1 DECOMPRESSION WITH IN SITU FUSION (N/A) 2 Days Post-Op  Patient reports pain as 2 on 0-10 scale.  Reports decreased leg pain reports incisional back pain   Positive void Negative bowel movement Positive flatus Negative chest pain or shortness of breath  Objective: Vital signs in last 24 hours: Temp:  [98.4 F (36.9 C)-99.2 F (37.3 C)] 99.2 F (37.3 C) (03/16 0752) Pulse Rate:  [72-92] 74 (03/16 0752) Resp:  [16-18] 18 (03/16 0752) BP: (104-130)/(51-87) 129/87 (03/16 0752) SpO2:  [93 %-96 %] 93 % (03/16 0752)  Intake/Output from previous day: 03/15 0701 - 03/16 0700 In: 720 [P.O.:720] Out: 190 [Drains:190]  Labs: No results for input(s): WBC, RBC, HCT, PLT in the last 72 hours. No results for input(s): NA, K, CL, CO2, BUN, CREATININE, GLUCOSE, CALCIUM in the last 72 hours. No results for input(s): LABPT, INR in the last 72 hours.  Physical Exam: ABD soft Neurovascular intact Intact pulses distally Incision: dressing C/D/I and no drainage Compartment soft Body mass index is 30.66 kg/m.   Assessment/Plan: Patient stable  xrays n/a Continue mobilization with physical therapy Continue care  Advance diet Up with therapy  Plan on d/c back to assistive living.  Agree with PT recommendation - patient will return to assisted living.  According to soon this is essentially a SNF Tramadol for pain Mobilization with assistance F/u 2 week   Melina Schools, MD [place] [phone]

## 2017-12-19 NOTE — Progress Notes (Signed)
Physical Therapy Treatment Patient Details Name: Wendy Roth MRN: [id number] DOB: [birth year] Today's Date: 12/19/2017    History of Present Illness Pt is an 84 y/o female s/p LUMBAR L3-S1 DECOMPRESSION WITH IN SITU FUSION; PMhx includes breast cancer, DM, HTN, CAD, anxiety, carpal tunnel     PT Comments    Patient seen for mobility progression. Continues to demonstrate deficits in functional mobility and safety awareness. Continued cues required throughout session. Patient was able to improve overall activity tolerance. OF NOTE: patient with reported fall last evening. Given fall history and poor safety awareness SNF remains appropriate unless family is able to provide 24/7 assist.    Follow Up Recommendations  SNF;Supervision/Assistance - 24 hour     Equipment Recommendations  Rolling walker with 5" wheels(youth)    Recommendations for Other Services       Precautions / Restrictions Precautions Precautions: Back Precaution Booklet Issued: Yes (comment) Precaution Comments: reviewed with pt; pt able to recall 2/3 back precautions start of session  Required Braces or Orthoses: Spinal Brace Spinal Brace: Lumbar corset;Applied in sitting position Restrictions Weight Bearing Restrictions: No    Mobility  Bed Mobility               General bed mobility comments: received up in chair  Transfers Overall transfer level: Needs assistance Equipment used: Rolling walker (2 wheeled) Transfers: Sit to/from Stand Sit to Stand: Min guard         General transfer comment: Vcs for hand placement and safety when elevating to standing  Ambulation/Gait Ambulation/Gait assistance: Min guard Ambulation Distance (Feet): 310 Feet Assistive device: Rolling walker (2 wheeled) Gait Pattern/deviations: Step-through pattern;Decreased stride length;Trunk flexed Gait velocity: Decreased Gait velocity interpretation: Below normal speed for age/gender General Gait Details:  continuous cues for safety and positioning with RW. intermittent cues for safety and precautions   Stairs            Wheelchair Mobility    Modified Rankin (Stroke Patients Only)       Balance Overall balance assessment: Needs assistance Sitting-balance support: Feet supported;No upper extremity supported Sitting balance-Leahy Scale: Good     Standing balance support: Bilateral upper extremity supported Standing balance-Leahy Scale: Poor Standing balance comment: reliant on UE support for dynamic mobility                             Cognition Arousal/Alertness: Awake/alert Behavior During Therapy: WFL for tasks assessed/performed Overall Cognitive Status: No family/caregiver present to determine baseline cognitive functioning                                 General Comments: Patient with poor recall of precautions throughout session      Exercises      General Comments        Pertinent Vitals/Pain Pain Assessment: Faces Faces Pain Scale: Hurts little more Pain Location: lower back  Pain Descriptors / Indicators: Grimacing;Sore Pain Intervention(s): Monitored during session    Home Living                      Prior Function            PT Goals (current goals can now be found in the care plan section) Acute Rehab PT Goals Patient Stated Goal: return home; regain independence  PT Goal Formulation: With patient Time For Goal Achievement: 01/01/18 Potential  to Achieve Goals: Good Progress towards PT goals: Progressing toward goals    Frequency    Min 5X/week      PT Plan Current plan remains appropriate    Co-evaluation              AM-PAC PT "6 Clicks" Daily Activity  Outcome Measure  Difficulty turning over in bed (including adjusting bedclothes, sheets and blankets)?: A Little Difficulty moving from lying on back to sitting on the side of the bed? : A Little Difficulty sitting down on and standing  up from a chair with arms (e.g., wheelchair, bedside commode, etc,.)?: A Little Help needed moving to and from a bed to chair (including a wheelchair)?: A Little Help needed walking in hospital room?: A Little Help needed climbing 3-5 steps with a railing? : A Lot 6 Click Score: 17    End of Session Equipment Utilized During Treatment: Gait belt Activity Tolerance: Patient tolerated treatment well Patient left: in chair;with call bell/phone within reach Nurse Communication: Mobility status PT Visit Diagnosis: Unsteadiness on feet (R26.81);Pain;Other symptoms and signs involving the nervous system (R29.898) Pain - part of body: (back)     Time: 4680-3212 PT Time Calculation (min) (ACUTE ONLY): 19 min  Charges:  $Gait Training: 8-22 mins                    G Codes:       Alben Deeds, PT DPT  Board Certified Neurologic Specialist [place] 12/19/2017, 8:15 AM

## 2017-12-19 NOTE — Clinical Social Work Note (Signed)
Clinical Social Work Assessment  Patient Details  Name: Wendy Roth MRN: [id number] Date of Birth: 08/27/1932  Date of referral:  12/19/17               Reason for consult:  Facility Placement                Permission sought to share information with:  Family Supports Permission granted to share information::  Yes, Verbal Permission Granted  Name::     Leory Plowman  Relationship::  Son  Contact Information:  [phone]  Housing/Transportation Living arrangements for the past 2 months:  [place] of Information:  Adult Children Patient Interpreter Needed:  None Criminal Activity/Legal Involvement Pertinent to Current Situation/Hospitalization:  No - Comment as needed Significant Relationships:  Spouse, Adult Children Lives with:  Spouse, Facility Resident Do you feel safe going back to the place where you live?  Yes Need for family participation in patient care:  Yes (Comment)  Care giving concerns:  Patient son verbalized frustrations about the barriers of insurance and the comfort of patient not returning to her home environment.  Patient son communicating with facility, however aware of the terms pending discharge to SNF today vs tomorrow.   Social Worker assessment / plan:  Holiday representative spoke with patient son over the phone to offer support and discuss patient needs at discharge.  Patient son was hopeful for patient return to ALF today, however per PT, patient is not safe to return and recommending SNF.  Patient husband in agreement as he feels he cannot safely care for patient at home.  Patient is from [facility] received confirmation from facility about available SNF bed on 3/17.  Patient son updated and not happy but agreeable.  CSW remains available for support and to facilitate patient discharge needs once bed available.  Employment status:  Retired Forensic scientist:  Medicare PT Recommendations:  [place] / Referral to community resources:  [place]  Patient/Family's Response to care:  Patient family verbalized understanding and appreciation for CSW role but continues to express frustrations about the need to stay hospitalized for insurance purposes.  Patient son is agreeable with patient spouse that patient will do better in SNF environment.  Patient/Family's Understanding of and Emotional Response to Diagnosis, Current Treatment, and Prognosis:  Patient family understanding of patient physical limitations and are agreeable with SNF placement at [facility].  Patient son understands the risks associated with return to ALF setting and the possibility of a fall or hospital readmission.  Emotional Assessment Appearance:  Appears stated age Attitude/Demeanor/Rapport:  Unable to Assess Affect (typically observed):  Unable to Assess Orientation:  Oriented to Self, Oriented to Situation, Oriented to Place, Oriented to  Time Alcohol / Substance use:  Not Applicable Psych involvement (Current and /or in the community):  No (Comment)  Discharge Needs  Concerns to be addressed:  Discharge Planning Concerns Readmission within the last 30 days:  No Current discharge risk:  Physical Impairment Barriers to Discharge:  Continued Medical Work up   [employer], [place]

## 2017-12-19 NOTE — Discharge Instructions (Signed)
Lumbar Diskectomy, Care After Refer to this sheet in the next few weeks. These instructions provide you with information about caring for yourself after your procedure. Your health care provider may also give you more specific instructions. Your treatment has been planned according to current medical practices, but problems sometimes occur. Call your health care provider if you have any problems or questions after your procedure. What can I expect after the procedure? After the procedure, it is common to have:  Pain.  Numbness.  Weakness.  Follow these instructions at home: Medicines  Take medicines only as directed by your health care provider.  If you were prescribed an antibiotic medicine, finish all of it even if you start to feel better. Incision care  There are many different ways to close and cover an incision, including stitches (sutures), skin glue, and adhesive strips. Follow your health care provider's instructions about: ? Incision care. ? Bandage (dressing) changes and removal. ? Incision closure removal.  Check your incision area every day for signs of infection. If you cannot see your incision, have someone check it for you. Watch for: ? Redness, swelling, or pain. ? Fluid, blood, or pus. Activity  Avoid sitting for longer than 20 minutes at a time or as directed by your health care provider.  NEED ASSISTANCE WHEN AMBULATING AS PATIENT IS A FALL RISK  Do not climb stairs more than once each day until your health care provider approves.  Do not bend at your waist. To pick things up, bend your knees.  Do not lift anything that is heavier than 10 lb (4.5 kg) or as directed by your health care provider.  Do not drive a car until your health care provider approves.  Ask your health care provider when you may return to your normal activities, such as playing sports and going back to work.  Work with your physical therapist to learn safe movement and exercises to  help healing. Do these exercises as directed.  Take short walks often. General instructions  Do not use any tobacco products, including cigarettes, chewing tobacco, or electronic cigarettes. If you need help quitting, ask your health care provider.  Follow your health care providers instructions about bathing. Do not take baths, shower, swim, or use a hot tub until your health care provider approves.  Wear your back brace as directed by your health care provider.  To prevent constipation: ? Drink enough fluid to keep your urine clear or pale yellow. ? Eat plenty of fruits, vegetables, and whole grains.  Keep all follow-up visits as directed by your health care provider. This is important. This includes any follow-up visits with your physical therapist. Contact a health care provider if:  You have a fever.  You have redness, swelling, or pain in your incision area.  Your pain is not controlled with medicine.  You have pain, numbness, or weakness that lasts longer than three weeks after surgery.  You become constipated. Get help right away if:  You have fluid, blood, or pus coming from your incision.  You have increasing pain, numbness, or weakness.  You lose control of when you urinate or have a bowel movement (incontinence).  You have chest pain.  You have trouble breathing. This information is not intended to replace advice given to you by your health care provider. Make sure you discuss any questions you have with your health care provider. Document Released: 08/27/2004 Document Revised: 02/28/2016 Document Reviewed: 05/17/2014 Elsevier Interactive Patient Education  [employer].

## 2017-12-20 DIAGNOSIS — R278 Other lack of coordination: Secondary | ICD-10-CM

## 2017-12-20 DIAGNOSIS — R2681 Unsteadiness on feet: Secondary | ICD-10-CM

## 2017-12-20 DIAGNOSIS — D72828 Other elevated white blood cell count: Secondary | ICD-10-CM

## 2017-12-20 DIAGNOSIS — M1990 Unspecified osteoarthritis, unspecified site: Secondary | ICD-10-CM | POA: Insufficient documentation

## 2017-12-20 DIAGNOSIS — M6281 Muscle weakness (generalized): Secondary | ICD-10-CM | POA: Insufficient documentation

## 2017-12-20 DIAGNOSIS — M4807 Spinal stenosis, lumbosacral region: Secondary | ICD-10-CM

## 2017-12-20 DIAGNOSIS — Z4789 Encounter for other orthopedic aftercare: Secondary | ICD-10-CM

## 2017-12-20 DIAGNOSIS — Z853 Personal history of malignant neoplasm of breast: Secondary | ICD-10-CM

## 2017-12-20 DIAGNOSIS — R110 Nausea: Secondary | ICD-10-CM

## 2017-12-20 HISTORY — DX: Unspecified osteoarthritis, unspecified site: M19.90

## 2017-12-20 HISTORY — DX: Other elevated white blood cell count: D72.828

## 2017-12-20 HISTORY — DX: Encounter for other orthopedic aftercare: Z47.89

## 2017-12-20 HISTORY — DX: Other lack of coordination: R27.8

## 2017-12-20 HISTORY — DX: Nausea: R11.0

## 2017-12-20 HISTORY — DX: Spinal stenosis, lumbosacral region: M48.07

## 2017-12-20 HISTORY — DX: Unsteadiness on feet: R26.81

## 2017-12-20 HISTORY — DX: Personal history of malignant neoplasm of breast: Z85.3

## 2017-12-20 HISTORY — DX: Muscle weakness (generalized): M62.81

## 2017-12-20 LAB — URINALYSIS, ROUTINE W REFLEX MICROSCOPIC
Bacteria, UA: NONE SEEN
Bilirubin Urine: NEGATIVE
Glucose, UA: NEGATIVE mg/dL
KETONES UR: 5 mg/dL — AB
Leukocytes, UA: NEGATIVE
NITRITE: NEGATIVE
PH: 6 (ref 5.0–8.0)
Protein, ur: 30 mg/dL — AB
SPECIFIC GRAVITY, URINE: 1.018 (ref 1.005–1.030)

## 2017-12-20 LAB — GLUCOSE, CAPILLARY: Glucose-Capillary: 168 mg/dL — ABNORMAL HIGH (ref 65–99)

## 2017-12-20 MED ORDER — ACETAMINOPHEN 500 MG PO TABS: 1000.0000 mg | ORAL_TABLET | Freq: Three times a day (TID) | ORAL | 0 refills | Status: DC | PRN

## 2017-12-20 NOTE — Progress Notes (Signed)
Subjective: 3 Days Post-Op Procedure(s) (LRB): LUMBAR L3-S1 DECOMPRESSION WITH IN SITU FUSION (N/A) Patient reports pain as 2 on 0-10 scale. Very confused but appears to have good memory of the past. We spoke with her husband and he feels it is best to return to the SNF.Her dressing is dry and he moves her lowers and has normal sensation.   Objective: Vital signs in last 24 hours: Temp:  [97.7 F (36.5 C)-99.6 F (37.6 C)] 98 F (36.7 C) (03/17 0811) Pulse Rate:  [80-114] 93 (03/17 0811) Resp:  [16-18] 16 (03/17 0811) BP: (99-148)/(38-72) 133/55 (03/17 0811) SpO2:  [89 %-94 %] 91 % (03/17 0811)  Intake/Output from previous day: 03/16 0701 - 03/17 0700 In: 390 [P.O.:390] Out: 180 [Urine:180] Intake/Output this shift: No intake/output data recorded.  No results for input(s): HGB in the last 72 hours. No results for input(s): WBC, RBC, HCT, PLT in the last 72 hours. No results for input(s): NA, K, CL, CO2, BUN, CREATININE, GLUCOSE, CALCIUM in the last 72 hours. No results for input(s): LABPT, INR in the last 72 hours.  Dorsiflexion/Plantar flexion intact  Assessment/Plan: 3 Days Post-Op Procedure(s) (LRB): LUMBAR L3-S1 DECOMPRESSION WITH IN SITU FUSION (N/A) Up with therapy  Latanya Maudlin 12/20/2017, 8:42 AM

## 2017-12-20 NOTE — Progress Notes (Signed)
Discharge to: [facility] Anticipated discharge date: 12/20/17 Family notified: Yes, [place], at bedside Transportation by: [facility]  Report #: [phone], Room 306  CSW signing off.  Laveda Abbe LCSW [phone]

## 2017-12-20 NOTE — Progress Notes (Signed)
Attempting to call report to [facility] with no success. [facility] transported Patient pending report.

## 2017-12-20 NOTE — Progress Notes (Signed)
Patient was alert and oriented  To person place and time during initial assessment. . During the course of the night pt became confused and attempted getting oob stating, she and Spouse [place] [facility] and going to have a cocktail dinner.. Pt reoriented and redirected,. nurse asked pt' spouse who   stated pt occasionally  gets confused and this is not new. However pt remained confused throughout the shift.  PA carmen  made aware of increased confusion and malodorous unrine.  Order for UA & U/S sent. No bacterial in U/A. Marland Kitchen Pt awake and quiet in bed this am. Needs met  No acute distress at this time. Husband at bedside.

## 2017-12-21 LAB — URINE CULTURE: Culture: NO GROWTH

## 2017-12-23 NOTE — Discharge Summary (Signed)
Physician Discharge Summary  Patient ID: Wendy Roth MRN: [id number] DOB/AGE: [birth year] 82 y.o.  Admit date: 12/17/2017 Discharge date: 12/20/17  Admission Diagnoses:  Lumbar spinal stenosis  Discharge Diagnoses:  Active Problems:   Status post lumbar spine operative procedure for decompression of spinal cord   Past Medical History:  Diagnosis Date  . Anxiety   . Arthritis   . Back pain   . Breast cancer ([place])   . Carpal tunnel syndrome   . Coronary artery disease 2005   stent placed  . Depression   . Diabetes mellitus without complication (HCC)    diet controlled.   . Fatigue   . GERD (gastroesophageal reflux disease)    "years ago" but still takes Prilosec  . Glaucoma    left eye  . History of kidney stones   . Hypertension   . Hypothyroidism   . Restless leg syndrome   . Seizures ([place])    as a baby    Surgeries: Procedure(s): LUMBAR L3-S1 DECOMPRESSION WITH IN SITU FUSION on 12/17/2017   Consultants (if any):   Discharged Condition: Improved  Hospital Course: VERNISHA BACOTE is an 84 y.o. female who was admitted 12/17/2017 with a diagnosis of Lumbar spinal stenosis and went to the operating room on 12/17/2017 and underwent the above named procedures.  Post op day one pt reports moderate pain. Pt is stable and no sign of CSF leak. Drain in place.  Post op day 2 pain level has decreased. Pt required assistance to mobilize. Pt recommended to return to SNF per PT pt will need to stay a 3rd night. Pt was moved to a different floor because 3C closes Saturday morning.  Post op day 3 pt reports low level of pain.  Pt exhibited some confusion.  UA negative.  No fever.  Pt is on Aricept for dementia.  Pt will return to SNF and receive rehab and partner with family in care.   She was given perioperative antibiotics:  Anti-infectives (From admission, onward)   Start     Dose/Rate Route Frequency Ordered Stop   12/17/17 1900  ceFAZolin (ANCEF) IVPB 2g/100 mL premix      2 g 200 mL/hr over 30 Minutes Intravenous Every 8 hours 12/17/17 1746 12/18/17 0248   12/17/17 0944  ceFAZolin (ANCEF) 2-4 GM/100ML-% IVPB    Comments:  [place], Samantha   : cabinet override      12/17/17 0944 12/17/17 1116   12/17/17 0942  ceFAZolin (ANCEF) IVPB 2g/100 mL premix     2 g 200 mL/hr over 30 Minutes Intravenous 30 min pre-op 12/17/17 0942 12/17/17 1116    .  She was given sequential compression devices, early ambulation, and TED for DVT prophylaxis.  She benefited maximally from the hospital stay and there were no complications.    Recent vital signs:  Vitals:   12/20/17 0422 12/20/17 0811  BP: (!) 131/56 (!) 133/55  Pulse: 98 93  Resp: 18 16  Temp: 97.7 F (36.5 C) 98 F (36.7 C)  SpO2: 93% 91%    Recent laboratory studies:  Lab Results  Component Value Date   HGB 14.3 12/14/2017   HGB 13.1 02/08/2017   HGB 14.1 02/07/2017   Lab Results  Component Value Date   WBC 7.0 12/14/2017   PLT 330 12/14/2017   Lab Results  Component Value Date   INR 1.09 02/04/2017   Lab Results  Component Value Date   NA 138 12/14/2017   K 4.2 12/14/2017  CL 105 12/14/2017   CO2 24 12/14/2017   BUN 21 (H) 12/14/2017   CREATININE 1.23 (H) 12/14/2017   GLUCOSE 122 (H) 12/14/2017    Discharge Medications:   Allergies as of 12/20/2017      Reactions   Cortisone Shortness Of Breath   She blames symptoms of SOB & palpitations on shot of cortisone.      Medication List    STOP taking these medications   aspirin EC 81 MG tablet   HYDROcodone-acetaminophen 5-325 MG tablet Commonly known as:  NORCO/VICODIN     TAKE these medications   acetaminophen 500 MG tablet Commonly known as:  TYLENOL Take 2 tablets (1,000 mg total) by mouth every 8 (eight) hours as needed for mild pain or moderate pain. And as needed every 6 hours. What changed:    how much to take  when to take this  reasons to take this   atorvastatin 20 MG tablet Commonly known as:   LIPITOR Take 20 mg by mouth at bedtime.   Baclofen 5 MG Tabs Take 5 mg by mouth 2 (two) times daily. (0800 & 2000)   buPROPion 300 MG 24 hr tablet Commonly known as:  WELLBUTRIN XL Take 300 mg by mouth daily.   cetirizine 10 MG tablet Commonly known as:  ZYRTEC Take 10 mg by mouth daily.   clonazePAM 1 MG tablet Commonly known as:  KLONOPIN Take 1 mg by mouth 2 (two) times daily as needed for anxiety (scheduled daily at 1100). 1100   diltiazem 120 MG 24 hr capsule Commonly known as:  CARDIZEM CD Take 1 capsule (120 mg total) by mouth daily. What changed:  when to take this   donepezil 5 MG tablet Commonly known as:  ARICEPT Take 5 mg by mouth every evening.   DULoxetine 60 MG capsule Commonly known as:  CYMBALTA Take 120 mg by mouth daily.   famotidine 20 MG tablet Commonly known as:  PEPCID Take 20 mg by mouth every evening.   fluticasone 50 MCG/ACT nasal spray Commonly known as:  FLONASE Place 1 spray into both nostrils daily. What changed:    when to take this  reasons to take this   hydrocortisone-pramoxine 2.5-1 % rectal cream Commonly known as:  ANALPRAM-HC Place 1 application rectally 2 (two) times daily as needed for hemorrhoids or anal itching.   latanoprost 0.005 % ophthalmic solution Commonly known as:  XALATAN Place 1 drop into both eyes at bedtime.   letrozole 2.5 MG tablet Commonly known as:  FEMARA TAKE 1 TABLET DAILY What changed:    how much to take  how to take this  when to take this   levothyroxine 75 MCG tablet Commonly known as:  SYNTHROID, LEVOTHROID Take 75 mcg by mouth daily before breakfast.   lisinopril 20 MG tablet Commonly known as:  PRINIVIL,ZESTRIL Take 20 mg by mouth daily.   magnesium oxide 400 (241.3 Mg) MG tablet Commonly known as:  MAG-OX Take 1 tablet (400 mg total) by mouth daily.   metoprolol succinate 50 MG 24 hr tablet Commonly known as:  TOPROL-XL Take 50 mg by mouth every evening. Take with or  immediately following a meal.   omeprazole 40 MG capsule Commonly known as:  PRILOSEC Take 40 mg by mouth daily.   ondansetron 4 MG disintegrating tablet Commonly known as:  ZOFRAN ODT Take 1 tablet (4 mg total) by mouth every 8 (eight) hours as needed for nausea or vomiting.   polyethylene glycol powder powder  Commonly known as:  GLYCOLAX/MIRALAX Take 17 g by mouth every other day. And daily as needed for constipation.   rOPINIRole 0.25 MG tablet Commonly known as:  REQUIP Take 0.5 mg by mouth at bedtime.   sennosides-docusate sodium 8.6-50 MG tablet Commonly known as:  SENOKOT-S Take 1 tablet by mouth daily.   vitamin B-12 1000 MCG tablet Commonly known as:  CYANOCOBALAMIN Take 1,000 mcg by mouth daily.       Diagnostic Studies: Dg Lumbar Spine 2-3 Views  Result Date: 12/17/2017 CLINICAL DATA:  Localization for L3-S1 decompression. EXAM: LUMBAR SPINE - 2-3 VIEW COMPARISON:  Abdominal CT 09/17/2017 FINDINGS: No preoperative spinal imaging. On comparison abdominal CT there are 5 lumbar type vertebral bodies below the small twelfth ribs. These are the true twelfth ribs based on a chest CT in 2018. Two films were submitted. The first shows spinal needles and the second shows a retractor with probe centered over the L5-S1 interspinous space. Advanced lower lumbar disc degeneration. L4-5 and L5-S1 anterolisthesis. These results were called by telephone at the time of interpretation on 12/17/2017 at 12:13 pm to OR 4 circulator who verbally acknowledged these results. IMPRESSION: A surgical probe projects over the L5-S1 interspinous space. Electronically Signed   By: Monte Fantasia M.D.   On: 12/17/2017 12:17    Disposition:  Post op medication provided Pt will present to clinic in 2 weeks  Discharge Instructions    Incentive spirometry RT   Complete by:  As directed        Contact information for follow-up providers    Melina Schools, MD Follow up in 2 week(s).   Specialty:   Orthopedic Surgery Contact information: [address] [place] [address] [phone]            Contact information for after-discharge care    Destination    [facility] SN[facility]ALF Follow up.   Service:  Skilled Nursing Contact information: [place] [phone]                   Signed: Valinda Hoar 12/23/2017, 8:16 AM

## 2017-12-25 ENCOUNTER — Encounter (HOSPITAL_COMMUNITY): Payer: Self-pay | Admitting: Orthopedic Surgery

## 2017-12-30 DIAGNOSIS — J301 Allergic rhinitis due to pollen: Secondary | ICD-10-CM | POA: Insufficient documentation

## 2017-12-30 DIAGNOSIS — F29 Unspecified psychosis not due to a substance or known physiological condition: Secondary | ICD-10-CM | POA: Insufficient documentation

## 2017-12-30 DIAGNOSIS — H04129 Dry eye syndrome of unspecified lacrimal gland: Secondary | ICD-10-CM

## 2017-12-30 DIAGNOSIS — G9009 Other idiopathic peripheral autonomic neuropathy: Secondary | ICD-10-CM

## 2017-12-30 DIAGNOSIS — J00 Acute nasopharyngitis [common cold]: Secondary | ICD-10-CM

## 2017-12-30 DIAGNOSIS — L600 Ingrowing nail: Secondary | ICD-10-CM

## 2017-12-30 DIAGNOSIS — R197 Diarrhea, unspecified: Secondary | ICD-10-CM

## 2017-12-30 DIAGNOSIS — M79669 Pain in unspecified lower leg: Secondary | ICD-10-CM

## 2017-12-30 DIAGNOSIS — B370 Candidal stomatitis: Secondary | ICD-10-CM

## 2017-12-30 DIAGNOSIS — R269 Unspecified abnormalities of gait and mobility: Secondary | ICD-10-CM

## 2017-12-30 DIAGNOSIS — H6123 Impacted cerumen, bilateral: Secondary | ICD-10-CM

## 2017-12-30 HISTORY — DX: Unspecified psychosis not due to a substance or known physiological condition: F29

## 2017-12-30 HISTORY — DX: Pain in unspecified lower leg: M79.669

## 2017-12-30 HISTORY — DX: Allergic rhinitis due to pollen: J30.1

## 2017-12-30 HISTORY — DX: Impacted cerumen, bilateral: H61.23

## 2017-12-30 HISTORY — DX: Candidal stomatitis: B37.0

## 2017-12-30 HISTORY — DX: Ingrowing nail: L60.0

## 2017-12-30 HISTORY — DX: Other idiopathic peripheral autonomic neuropathy: G90.09

## 2017-12-30 HISTORY — DX: Dry eye syndrome of unspecified lacrimal gland: H04.129

## 2017-12-30 HISTORY — DX: Unspecified abnormalities of gait and mobility: R26.9

## 2017-12-30 HISTORY — DX: Diarrhea, unspecified: R19.7

## 2017-12-30 HISTORY — DX: Acute nasopharyngitis (common cold): J00

## 2018-07-28 ENCOUNTER — Other Ambulatory Visit: Payer: Self-pay | Admitting: Hematology and Oncology

## 2018-07-28 DIAGNOSIS — Z9889 Other specified postprocedural states: Secondary | ICD-10-CM

## 2018-08-23 ENCOUNTER — Ambulatory Visit
Admission: RE | Admit: 2018-08-23 | Discharge: 2018-08-23 | Disposition: A | Discharge status: Home / Self Care | Payer: Medicare Other | Source: Ambulatory Visit | Attending: Hematology and Oncology | Admitting: Hematology and Oncology

## 2018-08-23 DIAGNOSIS — Z9889 Other specified postprocedural states: Secondary | ICD-10-CM

## 2018-08-24 ENCOUNTER — Inpatient Hospital Stay: Payer: Medicare Other | Attending: Hematology and Oncology | Admitting: Hematology and Oncology

## 2018-08-24 ENCOUNTER — Telehealth: Payer: Self-pay | Admitting: Hematology and Oncology

## 2018-08-24 DIAGNOSIS — Z9223 Personal history of estrogen therapy: Secondary | ICD-10-CM

## 2018-08-24 DIAGNOSIS — Z170 Estrogen receptor positive status [ER+]: Secondary | ICD-10-CM

## 2018-08-24 DIAGNOSIS — Z853 Personal history of malignant neoplasm of breast: Secondary | ICD-10-CM

## 2018-08-24 DIAGNOSIS — M25551 Pain in right hip: Secondary | ICD-10-CM

## 2018-08-24 DIAGNOSIS — C50312 Malignant neoplasm of lower-inner quadrant of left female breast: Secondary | ICD-10-CM

## 2018-08-24 DIAGNOSIS — Z79899 Other long term (current) drug therapy: Secondary | ICD-10-CM

## 2018-08-24 NOTE — Telephone Encounter (Signed)
Gave avs and calendar ° °

## 2018-08-24 NOTE — Progress Notes (Signed)
Patient Care Team: Javier Glazier, MD as PCP - General (Internal Medicine)  DIAGNOSIS:    ICD-10-CM   1. Malignant neoplasm of lower-inner quadrant of left breast in female, estrogen receptor positive ([place]) C50.312    Z17.0     SUMMARY OF ONCOLOGIC HISTORY:   Cancer of lower-inner quadrant of left female breast ([place])   10/12/2012 Initial Diagnosis    Right breast biopsy: Invasive ductal carcinoma with DCIS ER 100%, PR 100%, Ki-67 15%, HER-2 negative ratio 1.13    10/13/2012 - 07/18/2013 Anti-estrogen oral therapy    Neoadjuvant Antiestrogen therapy with Femara 2.5 mg daily    07/05/2013 Surgery    Right breast lumpectomy: Invasive ductal carcinoma grade 1-2.2 cm with low-grade DCIS, the excision margins negative    08/17/2013 -  Anti-estrogen oral therapy    Letrozole 2.5 mg daily for 5 years     CHIEF COMPLIANT: Follow up for completion of Letrozole therapy  INTERVAL HISTORY: Wendy Roth is a 84 y.o. with above-mentioned history of left breast cancer treated with neoadjuvant antiestrogen therapy followed by lumpectomy. She was last seen by me 1 year ago. In interim she had a lumbar fusion surgery in 12/2017.  She presents to the clinic today with her husband. She reviewed her medication list with me and requested to remove cortisol. She finished Letrozole last week and mammograms show no malignancy. She notes an improvement in back pain since her back surgery and knee pain, for which she is eligible for replacements in both knees. She notes right hip pain with intermittent pain radiating down her anterior right leg to a lump in her calf. She ambulates with a walker.   REVIEW OF SYSTEMS:   Constitutional: Denies fevers, chills or abnormal weight loss Eyes: Denies blurriness of vision Ears, nose, mouth, throat, and face: Denies mucositis or sore throat Respiratory: Denies cough, dyspnea or wheezes Cardiovascular: Denies palpitation, chest discomfort Gastrointestinal:   Denies nausea, heartburn or change in bowel habits Skin: Denies abnormal skin rashes MSK: (+) right hip pain, (+) bilateral knee pain, (+) back pain, improved, (+) left leg pain, with intermittent lump Lymphatics: Denies new lymphadenopathy or easy bruising Neurological:Denies numbness, tingling or new weaknesses Behavioral/Psych: Mood is stable, no new changes  Extremities: No lower extremity edema Breast: denies any pain or lumps or nodules in either breasts All other systems were reviewed with the patient and are negative.  I have reviewed the past medical history, past surgical history, social history and family history with the patient and they are unchanged from previous note.  ALLERGIES:  is allergic to cortisone.  MEDICATIONS:  Current Outpatient Medications  Medication Sig Dispense Refill  . acetaminophen (TYLENOL) 500 MG tablet Take 2 tablets (1,000 mg total) by mouth every 8 (eight) hours as needed for mild pain or moderate pain. And as needed every 6 hours. 30 tablet 0  . atorvastatin (LIPITOR) 20 MG tablet Take 20 mg by mouth at bedtime.    . Baclofen 5 MG TABS Take 5 mg by mouth 2 (two) times daily. (0800 & 2000)    . buPROPion (WELLBUTRIN XL) 300 MG 24 hr tablet Take 300 mg by mouth daily.    . cetirizine (ZYRTEC) 10 MG tablet Take 10 mg by mouth daily.    . clonazePAM (KLONOPIN) 1 MG tablet Take 1 mg by mouth 2 (two) times daily as needed for anxiety (scheduled daily at 1100). 1100    . diltiazem (CARDIZEM CD) 120 MG 24 hr capsule  Take 1 capsule (120 mg total) by mouth daily. (Patient taking differently: Take 120 mg by mouth every evening. ) 30 capsule 2  . donepezil (ARICEPT) 5 MG tablet Take 5 mg by mouth every evening.    . DULoxetine (CYMBALTA) 60 MG capsule Take 120 mg by mouth daily.     . famotidine (PEPCID) 20 MG tablet Take 20 mg by mouth every evening.     . fluticasone (FLONASE) 50 MCG/ACT nasal spray Place 1 spray into both nostrils daily. (Patient taking  differently: Place 1 spray into both nostrils daily as needed for allergies or rhinitis. ) 16 g 0  . latanoprost (XALATAN) 0.005 % ophthalmic solution Place 1 drop into both eyes at bedtime.    Marland Kitchen levothyroxine (SYNTHROID, LEVOTHROID) 75 MCG tablet Take 75 mcg by mouth daily before breakfast.    . lisinopril (PRINIVIL,ZESTRIL) 20 MG tablet Take 20 mg by mouth daily.    . magnesium oxide (MAG-OX) 400 (241.3 Mg) MG tablet Take 1 tablet (400 mg total) by mouth daily. 30 tablet 1  . metoprolol succinate (TOPROL-XL) 50 MG 24 hr tablet Take 50 mg by mouth every evening. Take with or immediately following a meal.     . omeprazole (PRILOSEC) 40 MG capsule Take 40 mg by mouth daily.    . ondansetron (ZOFRAN ODT) 4 MG disintegrating tablet Take 1 tablet (4 mg total) by mouth every 8 (eight) hours as needed for nausea or vomiting. 20 tablet 0  . polyethylene glycol powder (GLYCOLAX/MIRALAX) powder Take 17 g by mouth every other day. And daily as needed for constipation.    Marland Kitchen rOPINIRole (REQUIP) 0.25 MG tablet Take 0.5 mg by mouth at bedtime.    . sennosides-docusate sodium (SENOKOT-S) 8.6-50 MG tablet Take 1 tablet by mouth daily.    . vitamin B-12 (CYANOCOBALAMIN) 1000 MCG tablet Take 1,000 mcg by mouth daily.     No current facility-administered medications for this visit.     PHYSICAL EXAMINATION: ECOG PERFORMANCE STATUS: 1 - Symptomatic but completely ambulatory  Vitals:   08/24/18 0943  BP: (!) 152/67  Pulse: 64  Resp: 17  Temp: 98.2 F (36.8 C)  SpO2: 97%   Filed Weights   08/24/18 0943  Weight: 164 lb 14.4 oz (74.8 kg)    GENERAL:alert, no distress and comfortable SKIN: skin color, texture, turgor are normal, no rashes or significant lesions EYES: normal, Conjunctiva are pink and non-injected, sclera clear OROPHARYNX:no exudate, no erythema and lips, buccal mucosa, and tongue normal  NECK: supple, thyroid normal size, non-tender, without nodularity LYMPH:  no palpable lymphadenopathy  in the cervical, axillary or inguinal LUNGS: clear to auscultation and percussion with normal breathing effort HEART: regular rate & rhythm and no murmurs and no lower extremity edema ABDOMEN:abdomen soft, non-tender and normal bowel sounds MUSCULOSKELETAL:no cyanosis of digits and no clubbing  NEURO: alert & oriented x 3 with fluent speech, no focal motor/sensory deficits EXTREMITIES: No lower extremity edema  LABORATORY DATA:  I have reviewed the data as listed CMP Latest Ref Rng & Units 12/14/2017 09/07/2017 02/08/2017  Glucose 65 - 99 mg/dL 122(H) - 261(H)  BUN 6 - 20 mg/dL 21(H) - 25(H)  Creatinine 0.44 - 1.00 mg/dL 1.23(H) 1.10(H) 1.12(H)  Sodium 135 - 145 mmol/L 138 - 138  Potassium 3.5 - 5.1 mmol/L 4.2 - 4.3  Chloride 101 - 111 mmol/L 105 - 103  CO2 22 - 32 mmol/L 24 - 28  Calcium 8.9 - 10.3 mg/dL 9.0 - 8.8(L)  Total  Protein 6.5 - 8.1 g/dL - - 6.0(L)  Total Bilirubin 0.3 - 1.2 mg/dL - - 0.4  Alkaline Phos 38 - 126 U/L - - 56  AST 15 - 41 U/L - - 25  ALT 14 - 54 U/L - - 31    Lab Results  Component Value Date   WBC 7.0 12/14/2017   HGB 14.3 12/14/2017   HCT 43.5 12/14/2017   MCV 96.7 12/14/2017   PLT 330 12/14/2017   NEUTROABS 8.3 (H) 02/07/2017    ASSESSMENT & PLAN:  Cancer of lower-inner quadrant of left female breast ([place]) Right breast invasive ductal carcinomaT2, N0, M0 stage II A. with DCIS ER was n.p.o. 100% HER-2 negative Ki-67 15% treated with neoadjuvant antiestrogen therapy followed by left lumpectomy followed by antiestrogen therapy with letrozole since October 2014 completed October 2019.     Letrozole toxicities:   Arthritis and myalgias     Breast cancer surveillance: Breast exam 08/24/2018 is normal  Annual mammograms 08/23/2018: Benign breast density category C  Return to clinic in 1 year for followup with long-term survivorship   No orders of the defined types were placed in this encounter.  The patient has a good understanding of the  overall plan. she agrees with it. she will call with any problems that may develop before the next visit here.  Nicholas Lose, MD 08/24/2018   I, Cloyde Reams Dorshimer, am acting as scribe for Nicholas Lose, MD.  I have reviewed the above documentation for accuracy and completeness, and I agree with the above.

## 2018-08-24 NOTE — Assessment & Plan Note (Addendum)
Right breast invasive ductal carcinomaT2, N0, M0 stage II A. with DCIS ER was n.p.o. 100% HER-2 negative Ki-67 15% treated with neoadjuvant antiestrogen therapy followed by left lumpectomy followed by antiestrogen therapy with letrozole since October 2014 completed October 2019.  Stopped due to toxicities at 5-year point  Letrozole toxicities:   Arthritis and myalgias     Breast cancer surveillance: Breast exam 08/24/2018 is normal  Annual mammograms 08/23/2018: Benign breast density category C  Return to clinic in 1 year for followup with long-term survivorship

## 2018-11-18 ENCOUNTER — Emergency Department (HOSPITAL_BASED_OUTPATIENT_CLINIC_OR_DEPARTMENT_OTHER): Payer: Medicare Other

## 2018-11-18 ENCOUNTER — Encounter (HOSPITAL_BASED_OUTPATIENT_CLINIC_OR_DEPARTMENT_OTHER): Payer: Self-pay | Admitting: Emergency Medicine

## 2018-11-18 ENCOUNTER — Emergency Department (HOSPITAL_BASED_OUTPATIENT_CLINIC_OR_DEPARTMENT_OTHER)
Admission: EM | Admit: 2018-11-18 | Discharge: 2018-11-18 | Disposition: A | Discharge status: Home / Self Care | Payer: Medicare Other | Attending: Emergency Medicine | Admitting: Emergency Medicine

## 2018-11-18 ENCOUNTER — Other Ambulatory Visit: Payer: Self-pay

## 2018-11-18 DIAGNOSIS — M79604 Pain in right leg: Secondary | ICD-10-CM

## 2018-11-18 DIAGNOSIS — Z853 Personal history of malignant neoplasm of breast: Secondary | ICD-10-CM | POA: Diagnosis not present

## 2018-11-18 DIAGNOSIS — I2510 Atherosclerotic heart disease of native coronary artery without angina pectoris: Secondary | ICD-10-CM | POA: Diagnosis not present

## 2018-11-18 DIAGNOSIS — M79606 Pain in leg, unspecified: Secondary | ICD-10-CM

## 2018-11-18 DIAGNOSIS — E119 Type 2 diabetes mellitus without complications: Secondary | ICD-10-CM | POA: Diagnosis not present

## 2018-11-18 DIAGNOSIS — I10 Essential (primary) hypertension: Secondary | ICD-10-CM | POA: Insufficient documentation

## 2018-11-18 DIAGNOSIS — E039 Hypothyroidism, unspecified: Secondary | ICD-10-CM | POA: Insufficient documentation

## 2018-11-18 DIAGNOSIS — M7989 Other specified soft tissue disorders: Secondary | ICD-10-CM

## 2018-11-18 NOTE — Discharge Instructions (Signed)
Your ultrasound today shows no blood clot in the leg. There is some bruising in the muscle which is likely causing your pain. Apply heat and follow up with your PCP as needed.

## 2018-11-18 NOTE — ED Provider Notes (Signed)
Emergency Department Provider Note   I have reviewed the triage vital signs and the nursing notes.   HISTORY  Chief Complaint Leg Pain   HPI Wendy Roth is a 85 y.o. female presents to the emergency department for evaluation of right lateral leg pain extending into the thigh with appreciable swelling.  She denies any chest pain or shortness of breath.  No known injury.  The patient has circled an area on the right lower leg which is the location of most of her pain.  She has not noticed any bruising, redness.  No fevers or chills.  Pain worse with touching the area.  No difficulty with ambulation.  No recent falls.  Patient is not anticoagulated.  Past Medical History:  Diagnosis Date  . Anxiety   . Arthritis   . Back pain   . Breast cancer ([place])   . Carpal tunnel syndrome   . Coronary artery disease 2005   stent placed  . Depression   . Diabetes mellitus without complication ([facility])    diet controlled.   . Fatigue   . GERD (gastroesophageal reflux disease)    "years ago" but still takes Prilosec  . Glaucoma    left eye  . History of kidney stones   . Hypertension   . Hypothyroidism   . Restless leg syndrome   . Seizures ([place])    as a baby    Patient Active Problem List   Diagnosis Date Noted  . Status post lumbar spine operative procedure for decompression of spinal cord 12/17/2017  . Hypoxia 02/07/2017  . SVT (supraventricular tachycardia) ([place]) 02/04/2017  . HLD (hyperlipidemia) 02/04/2017  . Depression 02/04/2017  . Chronic back pain 02/04/2017  . Acute respiratory failure with hypoxia ([place]) 02/04/2017  . Hypothyroidism   . GERD (gastroesophageal reflux disease)   . Anxiety   . Umbilical hernia 74/16/3845  . Family history of breast cancer 02/18/2013  . Family history of malignant neoplasm of female breast 02/18/2013  . Cancer of lower-inner quadrant of left female breast ([place]) 10/18/2012  . Coronary artery disease 10/07/2003    Past Surgical  History:  Procedure Laterality Date  . ABDOMINAL HYSTERECTOMY    . ANGIOPLASTY     x2  . BREAST LUMPECTOMY Left 2002  . BREAST LUMPECTOMY Right 2017  . BREAST LUMPECTOMY WITH NEEDLE LOCALIZATION Right 07/05/2013   Procedure: RIGHT BREAST WIRE GUIDED LUMPECTOMY ;  Surgeon: Rolm Bookbinder, MD;  Location: [facility];  Service: General;  Laterality: Right;  . CARPAL TUNNEL RELEASE     bilateral  . COLONOSCOPY    . CORONARY ANGIOPLASTY  2005   stent  . EYE SURGERY Bilateral    cataracts  . HERNIA REPAIR    . INSERTION OF MESH N/A 06/11/2016   Procedure: INSERTION OF MESH;  Surgeon: Rolm Bookbinder, MD;  Location: [facility];  Service: General;  Laterality: N/A;  . LAPAROSCOPIC INCISIONAL / UMBILICAL / [place]  06/11/2016   UHR w/mesh  . LUMBAR LAMINECTOMY/DECOMPRESSION MICRODISCECTOMY N/A 12/17/2017   Procedure: LUMBAR L3-S1 DECOMPRESSION WITH IN SITU FUSION;  Surgeon: Melina Schools, MD;  Location: [facility];  Service: Orthopedics;  Laterality: N/A;  . MENISCUS REPAIR     left  . PARATHYROIDECTOMY    . RE-EXCISION OF BREAST CANCER,SUPERIOR MARGINS Right 08/01/2013   Procedure: RE-EXCISION OF BREAST CANCER MARGIN;  Surgeon: Rolm Bookbinder, MD;  Location: [facility];  Service: General;  Laterality: Right;  . TONSILLECTOMY    . UMBILICAL HERNIA  REPAIR N/A 06/11/2016   Procedure: LAPAROSCOPIC UMBILICAL HERNIA;  Surgeon: Rolm Bookbinder, MD;  [facility];  Service: General;  Laterality: N/A;    Allergies Cortisone  Family History  Problem Relation Age of Onset  . Cancer Mother 15       lung cancer   . Cancer Father 3       possible breast cancer  . Cancer Sister        lung cancer ? primary; paternal half sister  . Breast cancer Maternal Aunt 70       ? bilateral  . Cancer Maternal Grandmother 79       prostate cancer    Social History Social History   Tobacco Use  . Smoking status: Never Smoker  . Smokeless tobacco: Never Used  Substance Use Topics  . Alcohol use:  Yes    Comment: socially  . Drug use: No    Review of Systems  Constitutional: No fever/chills Eyes: No visual changes. ENT: No sore throat. Cardiovascular: Denies chest pain. Respiratory: Denies shortness of breath. Gastrointestinal: No abdominal pain.  No nausea, no vomiting.  No diarrhea.  No constipation. Genitourinary: Negative for dysuria. Musculoskeletal: Negative for back pain. Positive right leg pain and swelling.  Skin: Negative for rash. Neurological: Negative for headaches, focal weakness or numbness.  10-point ROS otherwise negative.  ____________________________________________   PHYSICAL EXAM:  VITAL SIGNS: ED Triage Vitals  Enc Vitals Group     BP 11/18/18 2000 131/64     Pulse Rate 11/18/18 2000 74     Resp 11/18/18 2000 18     Temp 11/18/18 2000 98.7 F (37.1 C)     Temp Source 11/18/18 2000 Oral     SpO2 11/18/18 2000 96 %     Weight 11/18/18 1956 165 lb 12.8 oz (75.2 kg)     Height 11/18/18 1956 5' (1.524 m)     Pain Score 11/18/18 1956 3   Constitutional: Alert and oriented. Well appearing and in no acute distress. Eyes: Conjunctivae are normal.  Head: Atraumatic. Nose: No congestion/rhinnorhea. Mouth/Throat: Mucous membranes are moist.  Oropharynx non-erythematous. Neck: No stridor. Cardiovascular: Normal rate, regular rhythm. Good peripheral circulation. Grossly normal heart sounds.   Respiratory: Normal respiratory effort.  No retractions. Lungs CTAB. Gastrointestinal: Soft and nontender. No distention.  Musculoskeletal: Mild right lower leg tenderness to palpation with area of focal swelling. No gross deformities of extremities. Neurologic:  Normal speech and language. No gross focal neurologic deficits are appreciated.  Skin:  Skin is warm, dry and intact. No rash noted.  ____________________________________________  RADIOLOGY  Dg Tibia/fibula Right  Result Date: 11/18/2018 CLINICAL DATA:  Right lower leg pain for several months, no  known injury, initial encounter EXAM: RIGHT TIBIA AND FIBULA - 2 VIEW COMPARISON:  None. FINDINGS: Degenerative changes about the knee joint are seen. No acute fracture or dislocation is noted. No focal hematoma is seen. No other focal abnormality is noted. IMPRESSION: Degenerative changes of the knee joint are seen. No acute bony or soft tissue abnormality is noted. Electronically Signed   By: Inez Catalina M.D.   On: 11/18/2018 20:39   US Venous Img Lower Right (dvt Study)  Result Date: 11/18/2018 CLINICAL DATA:  Right lower extremity pain and swelling for several weeks, initial encounter EXAM: RIGHT LOWER EXTREMITY VENOUS DOPPLER ULTRASOUND TECHNIQUE: Gray-scale sonography with graded compression, as well as color Doppler and duplex ultrasound were performed to evaluate the lower extremity deep venous systems from the level of  the common femoral vein and including the common femoral, femoral, profunda femoral, popliteal and calf veins including the posterior tibial, peroneal and gastrocnemius veins when visible. The superficial great saphenous vein was also interrogated. Spectral Doppler was utilized to evaluate flow at rest and with distal augmentation maneuvers in the common femoral, femoral and popliteal veins. COMPARISON:  None. FINDINGS: Contralateral Common Femoral Vein: Respiratory phasicity is normal and symmetric with the symptomatic side. No evidence of thrombus. Normal compressibility. Common Femoral Vein: No evidence of thrombus. Normal compressibility, respiratory phasicity and response to augmentation. Saphenofemoral Junction: No evidence of thrombus. Normal compressibility and flow on color Doppler imaging. Profunda Femoral Vein: No evidence of thrombus. Normal compressibility and flow on color Doppler imaging. Femoral Vein: No evidence of thrombus. Normal compressibility, respiratory phasicity and response to augmentation. Popliteal Vein: No evidence of thrombus. Normal compressibility,  respiratory phasicity and response to augmentation. Calf Veins: No evidence of thrombus. Normal compressibility and flow on color Doppler imaging. Superficial Great Saphenous Vein: No evidence of thrombus. Normal compressibility. Venous Reflux:  None. Other Findings: Mild subcutaneous edema is noted. Mild muscular prominence is noted laterally in the area of clinical concern which may represent some muscular hemorrhage. No organized hematoma is seen. IMPRESSION: No evidence of deep venous thrombosis. Mild prominence of the musculature laterally in the mid calf consistent with palpable abnormality. This may represent some intramuscular hemorrhage. No focal hematoma is noted. Electronically Signed   By: Inez Catalina M.D.   On: 11/18/2018 22:42   Dg Femur, Min 2 Views Right  Result Date: 11/18/2018 CLINICAL DATA:  Right leg pain, no known injury, initial encounter EXAM: RIGHT FEMUR 2 VIEWS COMPARISON:  None. FINDINGS: Mild degenerative changes of the knee joint are seen. No acute fracture or dislocation is seen. No soft tissue abnormality is noted. IMPRESSION: Degenerative change without acute abnormality. Electronically Signed   By: Inez Catalina M.D.   On: 11/18/2018 20:40    ____________________________________________   PROCEDURES  Procedure(s) performed:   Procedures   ____________________________________________   INITIAL IMPRESSION / ASSESSMENT AND PLAN / ED COURSE  Pertinent labs & imaging results that were available during my care of the patient were reviewed by me and considered in my medical decision making (see chart for details).  Patient presents to the emergency department for evaluation of right leg swelling.  There is an area of focal swelling over the right lower leg.  Pain does extend into the right thigh with some very mild right leg swelling diffusely when compared to the left.  Plain films ordered from triage reviewed with no acute bony abnormality.  Ultrasound obtained to  evaluate for DVT given leg swelling are normal.  There is a likely small hematoma in the musculature over the area of tenderness.  Advised warm compress and PCP follow-up as needed if symptoms worsen.   ____________________________________________  FINAL CLINICAL IMPRESSION(S) / ED DIAGNOSES  Final diagnoses:  Leg pain    Note:  This document was prepared using Dragon voice recognition software and may include unintentional dictation errors.  Nanda Quinton, MD Emergency Medicine    Jurnei Latini, [facility], MD 11/19/18 [phone]

## 2018-11-18 NOTE — ED Notes (Signed)
Patient transported to Ultrasound 

## 2018-11-18 NOTE — ED Triage Notes (Signed)
Pt c/o right leg pain for almost a week pt states she has a lump that is getting bigger on her right side with swollen going down all over to her feet, pt denies any injury.

## 2018-12-02 ENCOUNTER — Ambulatory Visit: Payer: Federal, State, Local not specified - PPO | Admitting: Cardiology

## 2018-12-06 ENCOUNTER — Ambulatory Visit (INDEPENDENT_AMBULATORY_CARE_PROVIDER_SITE_OTHER): Payer: Medicare Other | Admitting: Cardiology

## 2018-12-06 ENCOUNTER — Encounter: Payer: Self-pay | Admitting: Cardiology

## 2018-12-06 VITALS — BP 122/76 | HR 59 | Ht 60.0 in | Wt 167.0 lb

## 2018-12-06 DIAGNOSIS — E139 Other specified diabetes mellitus without complications: Secondary | ICD-10-CM

## 2018-12-06 DIAGNOSIS — E782 Mixed hyperlipidemia: Secondary | ICD-10-CM

## 2018-12-06 DIAGNOSIS — E088 Diabetes mellitus due to underlying condition with unspecified complications: Secondary | ICD-10-CM

## 2018-12-06 DIAGNOSIS — I10 Essential (primary) hypertension: Secondary | ICD-10-CM | POA: Insufficient documentation

## 2018-12-06 DIAGNOSIS — I440 Atrioventricular block, first degree: Secondary | ICD-10-CM | POA: Insufficient documentation

## 2018-12-06 DIAGNOSIS — E785 Hyperlipidemia, unspecified: Secondary | ICD-10-CM | POA: Insufficient documentation

## 2018-12-06 DIAGNOSIS — I2510 Atherosclerotic heart disease of native coronary artery without angina pectoris: Secondary | ICD-10-CM

## 2018-12-06 HISTORY — DX: Mixed hyperlipidemia: E78.2

## 2018-12-06 HISTORY — DX: Atrioventricular block, first degree: I44.0

## 2018-12-06 HISTORY — DX: Diabetes mellitus due to underlying condition with unspecified complications: E08.8

## 2018-12-06 HISTORY — DX: Other specified diabetes mellitus without complications: E13.9

## 2018-12-06 HISTORY — DX: Hyperlipidemia, unspecified: E78.5

## 2018-12-06 MED ORDER — NITROGLYCERIN 0.4 MG SL SUBL: 0.4000 mg | SUBLINGUAL_TABLET | SUBLINGUAL | 11 refills | Status: DC | PRN

## 2018-12-06 NOTE — Patient Instructions (Addendum)
Medication Instructions:   Your physician has recommended you make the following change in your medication:   START: NITROGLYCERIN 0.4 mg sublingual (under the tongue) AS NEEDED FOR CHEST PAIN ONLY!  When having chest pain, stop what you are doing and sit down. Take 1 nitro, wait 5 minutes. Still having chest pain, take 1 nitro, wait 5 minutes. Still having chest pain, take 1 nitro, dial 911. Total of 3 nitro in 15 minutes.   If you need a refill on your cardiac medications before your next appointment, please call your pharmacy.   Lab work:  Your physician recommends that you return for lab work today: BMP, CBC, TSH, LFT, LIPID.    If you have labs (blood work) drawn today and your tests are completely normal, you will receive your results only by: Marland Kitchen MyChart Message (if you have MyChart) OR . A paper copy in the mail If you have any lab test that is abnormal or we need to change your treatment, we will call you to review the results.  Testing/Procedures:  NONE  Follow-Up: At [facility], you and your health needs are our priority.  As part of our continuing mission to provide you with exceptional heart care, we have created designated Provider Care Teams.  These Care Teams include your primary Cardiologist (physician) and Advanced Practice Providers (APPs -  Physician Assistants and Nurse Practitioners) who all work together to provide you with the care you need, when you need it.  . You will need a follow up appointment in 6 months.  Please call our office 2 months in advance to schedule this appointment.   Any Other Special Instructions Will Be Listed Below (If Applicable).    Nitroglycerin sublingual tablets What is this medicine? NITROGLYCERIN (nye troe GLI ser in) is a type of vasodilator. It relaxes blood vessels, increasing the blood and oxygen supply to your heart. This medicine is used to relieve chest pain caused by angina. It is also used to prevent chest pain  before activities like climbing stairs, going outdoors in cold weather, or sexual activity. This medicine may be used for other purposes; ask your health care provider or pharmacist if you have questions. COMMON BRAND NAME(S): Nitroquick, Nitrostat, Nitrotab What should I tell my health care provider before I take this medicine? They need to know if you have any of these conditions: -anemia -head injury, recent stroke, or bleeding in the brain -liver disease -previous heart attack -an unusual or allergic reaction to nitroglycerin, other medicines, foods, dyes, or preservatives -pregnant or trying to get pregnant -breast-feeding How should I use this medicine? Take this medicine by mouth as needed. At the first sign of an angina attack (chest pain or tightness) place one tablet under your tongue. You can also take this medicine 5 to 10 minutes before an event likely to produce chest pain. Follow the directions on the prescription label. Let the tablet dissolve under the tongue. Do not swallow whole. Replace the dose if you accidentally swallow it. It will help if your mouth is not dry. Saliva around the tablet will help it to dissolve more quickly. Do not eat or drink, smoke or chew tobacco while a tablet is dissolving. If you are not better within 5 minutes after taking ONE dose of nitroglycerin, call 9-1-1 immediately to seek emergency medical care. Do not take more than 3 nitroglycerin tablets over 15 minutes. If you take this medicine often to relieve symptoms of angina, your doctor or health care  professional may provide you with different instructions to manage your symptoms. If symptoms do not go away after following these instructions, it is important to call 9-1-1 immediately. Do not take more than 3 nitroglycerin tablets over 15 minutes. Talk to your pediatrician regarding the use of this medicine in children. Special care may be needed. Overdosage: If you think you have taken too much of  this medicine contact a poison control center or emergency room at once. NOTE: This medicine is only for you. Do not share this medicine with others. What if I miss a dose? This does not apply. This medicine is only used as needed. What may interact with this medicine? Do not take this medicine with any of the following medications: -certain migraine medicines like ergotamine and dihydroergotamine (DHE) -medicines used to treat erectile dysfunction like sildenafil, tadalafil, and vardenafil -riociguat This medicine may also interact with the following medications: -alteplase -aspirin -heparin -medicines for high blood pressure -medicines for mental depression -other medicines used to treat angina -phenothiazines like chlorpromazine, mesoridazine, prochlorperazine, thioridazine This list may not describe all possible interactions. Give your health care provider a list of all the medicines, herbs, non-prescription drugs, or dietary supplements you use. Also tell them if you smoke, drink alcohol, or use illegal drugs. Some items may interact with your medicine. What should I watch for while using this medicine? Tell your doctor or health care professional if you feel your medicine is no longer working. Keep this medicine with you at all times. Sit or lie down when you take your medicine to prevent falling if you feel dizzy or faint after using it. Try to remain calm. This will help you to feel better faster. If you feel dizzy, take several deep breaths and lie down with your feet propped up, or bend forward with your head resting between your knees. You may get drowsy or dizzy. Do not drive, use machinery, or do anything that needs mental alertness until you know how this drug affects you. Do not stand or sit up quickly, especially if you are an older patient. This reduces the risk of dizzy or fainting spells. Alcohol can make you more drowsy and dizzy. Avoid alcoholic drinks. Do not treat  yourself for coughs, colds, or pain while you are taking this medicine without asking your doctor or health care professional for advice. Some ingredients may increase your blood pressure. What side effects may I notice from receiving this medicine? Side effects that you should report to your doctor or health care professional as soon as possible: -blurred vision -dry mouth -skin rash -sweating -the feeling of extreme pressure in the head -unusually weak or tired Side effects that usually do not require medical attention (report to your doctor or health care professional if they continue or are bothersome): -flushing of the face or neck -headache -irregular heartbeat, palpitations -nausea, vomiting This list may not describe all possible side effects. Call your doctor for medical advice about side effects. You may report side effects to FDA at 1-800-FDA-1088. Where should I keep my medicine? Keep out of the reach of children. Store at room temperature between 20 and 25 degrees C (68 and 77 degrees F). Store in [facility]. Protect from light and moisture. Keep tightly closed. Throw away any unused medicine after the expiration date. NOTE: This sheet is a summary. It may not cover all possible information. If you have questions about this medicine, talk to your doctor, pharmacist, or health care provider.  2019 Elsevier/Gold  Standard (2013-07-21 17:57:36)

## 2018-12-06 NOTE — Progress Notes (Signed)
Cardiology Office Note:    Date:  12/06/2018   ID:  PAISLEI DORVAL, DOB 1932-03-26, MRN [id number]  PCP:  Javier Glazier, MD  Cardiologist:  Jenean Lindau, MD   Referring MD: Javier Glazier, MD    ASSESSMENT:    1. Coronary artery disease involving native coronary artery of native heart without angina pectoris   2. Essential hypertension   3. Diabetes mellitus due to underlying condition with unspecified complications ([place])   4. Mixed dyslipidemia    PLAN:    In order of problems listed above:  1. The patient is previously unknown to me and she has seen Dr. Wynonia Lawman in the past.  Secondary prevention stressed with the patient.  Importance of compliance with diet and medication stressed and she vocalized understanding.  Her blood pressure is stable.  Diet was discussed for dyslipidemia and diabetes mellitus.  She tells me that it has been a long time since she has had blood work.  She is fasting this morning and will have blood work including fasting lipids 2. Sublingual nitroglycerin prescription was sent, its protocol and 911 protocol explained and the patient vocalized understanding questions were answered to the patient's satisfaction 3. Patient will be seen in follow-up appointment in 6 months or earlier if the patient has any concerns    Medication Adjustments/Labs and Tests Ordered: Current medicines are reviewed at length with the patient today.  Concerns regarding medicines are outlined above.  No orders of the defined types were placed in this encounter.  No orders of the defined types were placed in this encounter.    No chief complaint on file.    History of Present Illness:    KAMILLE TOOMEY is a 85 y.o. female.  Patient has past medical history of coronary artery disease, essential hypertension, dyslipidemia and diabetes mellitus.  She denies any problems at this time and takes care of activities of daily living.  No chest pain orthopnea or PND.   Her ambulation is limited because of orthopedic issues.  At the time of my evaluation, the patient is alert awake oriented and in no distress.  Past Medical History:  Diagnosis Date  . Anxiety   . Arthritis   . Back pain   . Breast cancer ([place])   . Carpal tunnel syndrome   . Coronary artery disease 2005   stent placed  . Depression   . Diabetes mellitus without complication (HCC)    diet controlled.   . Fatigue   . GERD (gastroesophageal reflux disease)    "years ago" but still takes Prilosec  . Glaucoma    left eye  . History of kidney stones   . Hypertension   . Hypothyroidism   . Restless leg syndrome   . Seizures ([place])    as a baby    Past Surgical History:  Procedure Laterality Date  . ABDOMINAL HYSTERECTOMY    . ANGIOPLASTY     x2  . BREAST LUMPECTOMY Left 2002  . BREAST LUMPECTOMY Right 2017  . BREAST LUMPECTOMY WITH NEEDLE LOCALIZATION Right 07/05/2013   Procedure: RIGHT BREAST WIRE GUIDED LUMPECTOMY ;  Surgeon: Rolm Bookbinder, MD;  Location: [place];  Service: General;  Laterality: Right;  . CARPAL TUNNEL RELEASE     bilateral  . COLONOSCOPY    . CORONARY ANGIOPLASTY  2005   stent  . EYE SURGERY Bilateral    cataracts  . HERNIA REPAIR    . INSERTION OF MESH N/A 06/11/2016  Procedure: INSERTION OF MESH;  Surgeon: Rolm Bookbinder, MD;  Location: [facility];  Service: General;  Laterality: N/A;  . LAPAROSCOPIC INCISIONAL / UMBILICAL / [place]  06/11/2016   UHR w/mesh  . LUMBAR LAMINECTOMY/DECOMPRESSION MICRODISCECTOMY N/A 12/17/2017   Procedure: LUMBAR L3-S1 DECOMPRESSION WITH IN SITU FUSION;  Surgeon: Melina Schools, MD;  Location: [place];  Service: Orthopedics;  Laterality: N/A;  . MENISCUS REPAIR     left  . PARATHYROIDECTOMY    . RE-EXCISION OF BREAST CANCER,SUPERIOR MARGINS Right 08/01/2013   Procedure: RE-EXCISION OF BREAST CANCER MARGIN;  Surgeon: Rolm Bookbinder, MD;  Location: [facility];  Service: General;  Laterality: Right;  .  TONSILLECTOMY    . UMBILICAL HERNIA REPAIR N/A 06/11/2016   Procedure: LAPAROSCOPIC UMBILICAL HERNIA;  Surgeon: Rolm Bookbinder, MD;  Location: [place];  Service: General;  Laterality: N/A;    Current Medications: Current Meds  Medication Sig  . acetaminophen (TYLENOL) 500 MG tablet Take 2 tablets (1,000 mg total) by mouth every 8 (eight) hours as needed for mild pain or moderate pain. And as needed every 6 hours.  Marland Kitchen atorvastatin (LIPITOR) 20 MG tablet Take 20 mg by mouth at bedtime.  . Baclofen 5 MG TABS Take 5 mg by mouth 2 (two) times daily. (0800 & 2000)  . buPROPion (WELLBUTRIN XL) 300 MG 24 hr tablet Take 300 mg by mouth daily.  . cetirizine (ZYRTEC) 10 MG tablet Take 10 mg by mouth daily.  . clonazePAM (KLONOPIN) 1 MG tablet Take 1 mg by mouth 2 (two) times daily as needed for anxiety (scheduled daily at 1100). 1100  . diltiazem (CARDIZEM CD) 120 MG 24 hr capsule Take 1 capsule (120 mg total) by mouth daily. (Patient taking differently: Take 120 mg by mouth every evening. )  . donepezil (ARICEPT) 5 MG tablet Take 5 mg by mouth every evening.  . DULoxetine (CYMBALTA) 60 MG capsule Take 120 mg by mouth daily.   . famotidine (PEPCID) 20 MG tablet Take 20 mg by mouth every evening.   . latanoprost (XALATAN) 0.005 % ophthalmic solution Place 1 drop into both eyes at bedtime.  Marland Kitchen levothyroxine (SYNTHROID, LEVOTHROID) 75 MCG tablet Take 75 mcg by mouth daily before breakfast.  . lisinopril (PRINIVIL,ZESTRIL) 20 MG tablet Take 20 mg by mouth daily.  . magnesium oxide (MAG-OX) 400 (241.3 Mg) MG tablet Take 1 tablet (400 mg total) by mouth daily.  . metoprolol succinate (TOPROL-XL) 50 MG 24 hr tablet Take 50 mg by mouth every evening. Take with or immediately following a meal.   . omeprazole (PRILOSEC) 40 MG capsule Take 40 mg by mouth daily.  . primidone (MYSOLINE) 50 MG tablet Take 1 tablet by mouth daily.  Marland Kitchen rOPINIRole (REQUIP) 0.25 MG tablet Take 0.5 mg by mouth at bedtime.  . vitamin B-12  (CYANOCOBALAMIN) 1000 MCG tablet Take 1,000 mcg by mouth daily.     Allergies:   Cortisone   Social History   Socioeconomic History  . Marital status: Married    Spouse name: Not on file  . Number of children: Not on file  . Years of education: Not on file  . Highest education level: Not on file  Occupational History  . Not on file  Social Needs  . Financial resource strain: Not on file  . Food insecurity:    Worry: Not on file    Inability: Not on file  . Transportation needs:    Medical: Not on file    Non-medical: Not on  file  Tobacco Use  . Smoking status: Never Smoker  . Smokeless tobacco: Never Used  Substance and Sexual Activity  . Alcohol use: Yes    Comment: socially  . Drug use: No  . Sexual activity: Not Currently  Lifestyle  . Physical activity:    Days per week: Not on file    Minutes per session: Not on file  . Stress: Not on file  Relationships  . Social connections:    Talks on phone: Not on file    Gets together: Not on file    Attends religious service: Not on file    Active member of club or organization: Not on file    Attends meetings of clubs or organizations: Not on file    Relationship status: Not on file  Other Topics Concern  . Not on file  Social History Narrative  . Not on file     Family History: The patient's family history includes Breast cancer (age of onset: 62) in her maternal aunt; Cancer in her sister; Cancer (age of onset: 74) in her father; Cancer (age of onset: 23) in her mother; Cancer (age of onset: 31) in her maternal grandmother.  ROS:   Please see the history of present illness.    All other systems reviewed and are negative.  EKGs/Labs/Other Studies Reviewed:    The following studies were reviewed today: EKG reveals sinus rhythm and nonspecific ST-T changes.   Recent Labs: 12/14/2017: BUN 21; Creatinine, Ser 1.23; Hemoglobin 14.3; Platelets 330; Potassium 4.2; Sodium 138  Recent Lipid Panel    Component  Value Date/Time   CHOL 119 02/04/2017 0235   TRIG 69 02/04/2017 0235   HDL 55 02/04/2017 0235   CHOLHDL 2.2 02/04/2017 0235   VLDL 14 02/04/2017 0235   LDLCALC 50 02/04/2017 0235    Physical Exam:    VS:  BP 122/76 (BP Location: Right Arm, Patient Position: Sitting, Cuff Size: Normal)   Pulse (!) 59   Ht 5' (1.524 m)   Wt 167 lb (75.8 kg)   SpO2 98%   BMI 32.61 kg/m     Wt Readings from Last 3 Encounters:  12/06/18 167 lb (75.8 kg)  11/18/18 165 lb 12.8 oz (75.2 kg)  08/24/18 164 lb 14.4 oz (74.8 kg)     GEN: Patient is in no acute distress HEENT: Normal NECK: No JVD; No carotid bruits LYMPHATICS: No lymphadenopathy CARDIAC: Hear sounds regular, 2/6 systolic murmur at the apex. RESPIRATORY:  Clear to auscultation without rales, wheezing or rhonchi  ABDOMEN: Soft, non-tender, non-distended MUSCULOSKELETAL:  No edema; No deformity  SKIN: Warm and dry NEUROLOGIC:  Alert and oriented x 3 PSYCHIATRIC:  Normal affect   Signed, Jenean Lindau, MD  12/06/2018 8:43 AM    [place]

## 2018-12-07 ENCOUNTER — Telehealth: Payer: Self-pay | Admitting: Emergency Medicine

## 2018-12-07 LAB — TSH: TSH: 2.42 u[IU]/mL (ref 0.450–4.500)

## 2018-12-07 LAB — CBC
HEMATOCRIT: 37.9 % (ref 34.0–46.6)
Hemoglobin: 13.2 g/dL (ref 11.1–15.9)
MCH: 32.5 pg (ref 26.6–33.0)
MCHC: 34.8 g/dL (ref 31.5–35.7)
MCV: 93 fL (ref 79–97)
Platelets: 332 10*3/uL (ref 150–450)
RBC: 4.06 x10E6/uL (ref 3.77–5.28)
RDW: 13.5 % (ref 11.7–15.4)
WBC: 6.3 10*3/uL (ref 3.4–10.8)

## 2018-12-07 LAB — LIPID PANEL
Chol/HDL Ratio: 2.9 ratio (ref 0.0–4.4)
Cholesterol, Total: 113 mg/dL (ref 100–199)
HDL: 39 mg/dL — ABNORMAL LOW (ref >39)
LDL CALC: 24 mg/dL (ref 0–99)
Triglycerides: 251 mg/dL — ABNORMAL HIGH (ref 0–149)
VLDL Cholesterol Cal: 50 mg/dL — ABNORMAL HIGH (ref 5–40)

## 2018-12-07 LAB — BASIC METABOLIC PANEL
BUN / CREAT RATIO: 21 (ref 12–28)
BUN: 19 mg/dL (ref 8–27)
CHLORIDE: 103 mmol/L (ref 96–106)
CO2: 26 mmol/L (ref 20–29)
Calcium: 8.9 mg/dL (ref 8.7–10.3)
Creatinine, Ser: 0.91 mg/dL (ref 0.57–1.00)
GFR calc Af Amer: 67 mL/min/{1.73_m2} (ref >59)
GFR calc non Af Amer: 58 mL/min/{1.73_m2} — ABNORMAL LOW (ref >59)
GLUCOSE: 113 mg/dL — AB (ref 65–99)
POTASSIUM: 5 mmol/L (ref 3.5–5.2)
SODIUM: 142 mmol/L (ref 134–144)

## 2018-12-07 LAB — HEPATIC FUNCTION PANEL
ALT: 15 IU/L (ref 0–32)
AST: 13 IU/L (ref 0–40)
Albumin: 4.2 g/dL (ref 3.6–4.6)
Alkaline Phosphatase: 80 IU/L (ref 39–117)
Bilirubin Total: 0.3 mg/dL (ref 0.0–1.2)
Bilirubin, Direct: 0.13 mg/dL (ref 0.00–0.40)
Total Protein: 6.1 g/dL (ref 6.0–8.5)

## 2018-12-07 NOTE — Telephone Encounter (Signed)
Called patient to inform her of lab results and medication changes she asked that I speak with a nurse at [facility] to inform them of this change, the have requested that I fax this information. Information will be faxed

## 2018-12-28 ENCOUNTER — Telehealth: Payer: Self-pay

## 2018-12-28 DIAGNOSIS — Z01810 Encounter for preprocedural cardiovascular examination: Secondary | ICD-10-CM

## 2018-12-28 NOTE — Telephone Encounter (Signed)
Pt needs pre op clearance, 1 day lexiscan required when covid precautions are lifted

## 2018-12-28 NOTE — Telephone Encounter (Signed)
Patient needs cardiology clearance for left total knee arthroplasty. Dr.RRR would like patient to have lexiscan performed prior to approval. Sharyn Lull at [facility] was notified that she will be added to schedule later in April due to [place] [facility].

## 2019-03-16 IMAGING — CR DG CHEST 1V PORT
1 series · 1 of 1 positions shown · non-contrast
Comparison: 06/12/2016

CLINICAL DATA: Tachycardia and dyspnea this evening.

EXAM:
PORTABLE CHEST 1 VIEW

[AP]
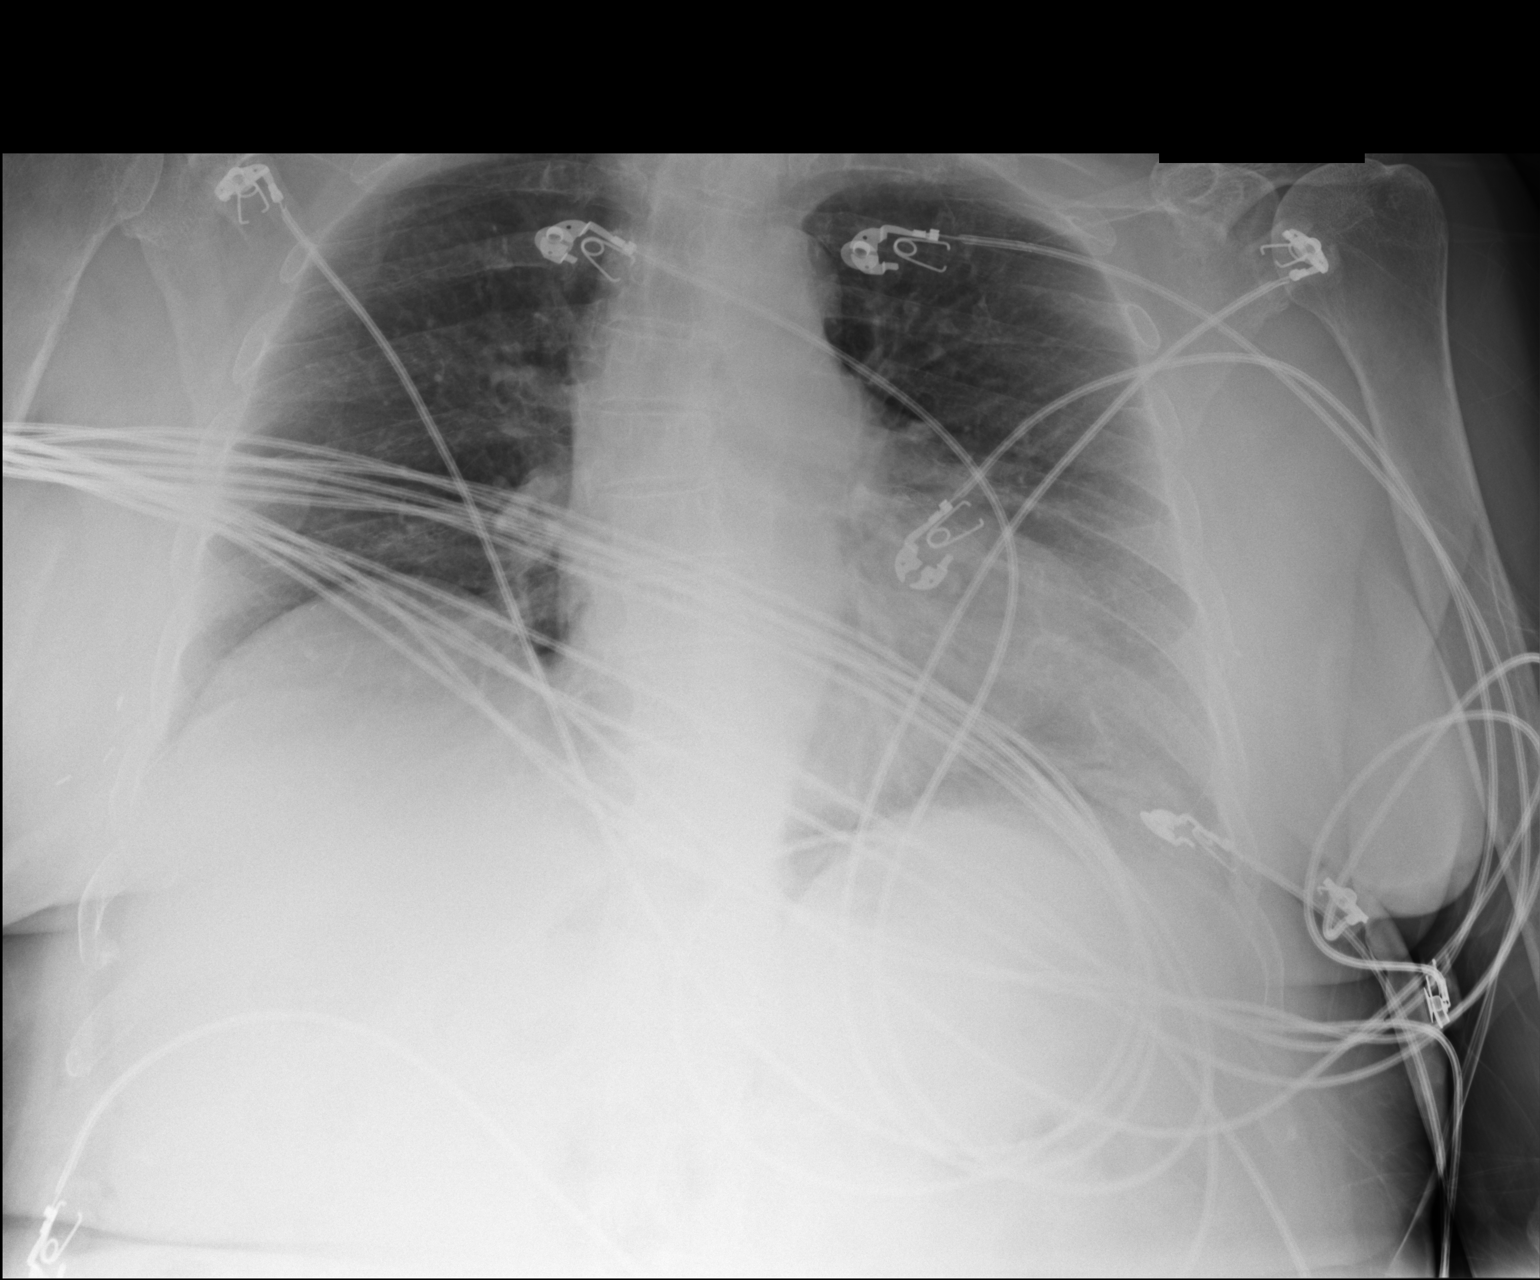

[1 of 1 positions shown; findings below may reference images not displayed]

FINDINGS: Mild chronic right hemidiaphragm elevation. No airspace
consolidation. No large effusion. Normal pulmonary vasculature.
Normal hilar and mediastinal contours, unchanged.
IMPRESSION: No acute cardiopulmonary findings.

## 2019-05-09 ENCOUNTER — Telehealth (HOSPITAL_COMMUNITY): Payer: Self-pay | Admitting: *Deleted

## 2019-05-09 NOTE — Telephone Encounter (Signed)
Patient is confused as to whom this test is scheduled.  She thought it was for her husband but it is actually for her. Patient given detailed instructions per Myocardial Perfusion Study Information Sheet for the test on 05/11/19 at 10:15. Patient notified to arrive 15 minutes early and that it is imperative to arrive on time for appointment to keep from having the test rescheduled.  If you need to cancel or reschedule your appointment, please call the office within 24 hours of your appointment. . Patient verbalized understanding.Wendy Roth

## 2019-05-11 ENCOUNTER — Ambulatory Visit (HOSPITAL_COMMUNITY): Payer: Medicare Other | Attending: Cardiology

## 2019-05-11 ENCOUNTER — Other Ambulatory Visit: Payer: Self-pay

## 2019-05-11 VITALS — Ht 60.0 in | Wt 167.0 lb

## 2019-05-11 DIAGNOSIS — I2510 Atherosclerotic heart disease of native coronary artery without angina pectoris: Secondary | ICD-10-CM

## 2019-05-11 DIAGNOSIS — Z01810 Encounter for preprocedural cardiovascular examination: Secondary | ICD-10-CM | POA: Insufficient documentation

## 2019-05-11 MED ORDER — TECHNETIUM TC 99M TETROFOSMIN IV KIT
32.2000 | PACK | Freq: Once | INTRAVENOUS | Status: AC | PRN
  Administered 2019-05-11: 32.2 via INTRAVENOUS
  Filled 2019-05-11: qty 33

## 2019-05-13 ENCOUNTER — Ambulatory Visit (HOSPITAL_COMMUNITY): Payer: Medicare Other | Attending: Cardiology

## 2019-05-13 ENCOUNTER — Other Ambulatory Visit: Payer: Self-pay

## 2019-05-13 DIAGNOSIS — I2510 Atherosclerotic heart disease of native coronary artery without angina pectoris: Secondary | ICD-10-CM | POA: Diagnosis present

## 2019-05-13 DIAGNOSIS — Z01810 Encounter for preprocedural cardiovascular examination: Secondary | ICD-10-CM | POA: Diagnosis present

## 2019-05-13 LAB — MYOCARDIAL PERFUSION IMAGING
LV dias vol: 77 mL (ref 46–106)
LV sys vol: 26 mL
Peak HR: 83 {beats}/min
Rest HR: 57 {beats}/min
SDS: 0
SRS: 0
SSS: 0
TID: 1.25

## 2019-05-13 MED ORDER — TECHNETIUM TC 99M TETROFOSMIN IV KIT
31.7000 | PACK | Freq: Once | INTRAVENOUS | Status: AC | PRN
  Administered 2019-05-13: 31.7 via INTRAVENOUS
  Filled 2019-05-13: qty 32

## 2019-05-13 MED ORDER — REGADENOSON 0.4 MG/5ML IV SOLN
0.4000 mg | Freq: Once | INTRAVENOUS | Status: AC
  Administered 2019-05-13: 0.4 mg via INTRAVENOUS

## 2019-05-18 ENCOUNTER — Telehealth: Payer: Self-pay

## 2019-05-18 NOTE — Telephone Encounter (Signed)
Attempted to call pt with lexi scan results, no answer, no voice mail. Will call again later.

## 2019-05-24 ENCOUNTER — Encounter: Payer: Self-pay | Admitting: *Deleted

## 2019-06-07 ENCOUNTER — Ambulatory Visit: Payer: Medicare Other | Admitting: Cardiology

## 2019-06-29 ENCOUNTER — Ambulatory Visit (INDEPENDENT_AMBULATORY_CARE_PROVIDER_SITE_OTHER): Payer: Medicare Other | Admitting: Cardiology

## 2019-06-29 ENCOUNTER — Other Ambulatory Visit: Payer: Self-pay

## 2019-06-29 ENCOUNTER — Encounter: Payer: Self-pay | Admitting: Cardiology

## 2019-06-29 VITALS — BP 120/70 | HR 62 | Temp 97.0°F | Ht 60.0 in | Wt 169.0 lb

## 2019-06-29 DIAGNOSIS — I10 Essential (primary) hypertension: Secondary | ICD-10-CM | POA: Diagnosis not present

## 2019-06-29 DIAGNOSIS — E785 Hyperlipidemia, unspecified: Secondary | ICD-10-CM | POA: Diagnosis not present

## 2019-06-29 DIAGNOSIS — Z1329 Encounter for screening for other suspected endocrine disorder: Secondary | ICD-10-CM

## 2019-06-29 DIAGNOSIS — I2510 Atherosclerotic heart disease of native coronary artery without angina pectoris: Secondary | ICD-10-CM | POA: Diagnosis not present

## 2019-06-29 DIAGNOSIS — E088 Diabetes mellitus due to underlying condition with unspecified complications: Secondary | ICD-10-CM | POA: Diagnosis not present

## 2019-06-29 NOTE — Progress Notes (Signed)
Cardiology Office Note:    Date:  06/29/2019   ID:  Wendy Roth, DOB [birth year], MRN [id number]  PCP:  Javier Glazier, MD  Cardiologist:  Jenean Lindau, MD   Referring MD: Javier Glazier, MD    ASSESSMENT:    1. Coronary artery disease involving native coronary artery of native heart without angina pectoris   2. Essential hypertension   3. Diabetes mellitus due to underlying condition with unspecified complications ([place])    PLAN:    In order of problems listed above:  1. Coronary artery disease: Secondary prevention stressed to the patient.  Importance of compliance with diet and medication stressed and she vocalized understanding. 2. Essential hypertension: Her blood pressure stable 3. Mixed dyslipidemia: Lipids will be reviewed today.  I would like to get blood work on her including fasting lipids 4. Patient will be seen in follow-up appointment in 6 months or earlier if the patient has any concerns    Medication Adjustments/Labs and Tests Ordered: Current medicines are reviewed at length with the patient today.  Concerns regarding medicines are outlined above.  No orders of the defined types were placed in this encounter.  No orders of the defined types were placed in this encounter.    Chief Complaint  Patient presents with  . Follow-up     History of Present Illness:    Wendy Roth is a 85 y.o. female.  Patient has known coronary artery disease, essential hypertension, diabetes mellitus and dyslipidemia.  She denies any problems at this time and takes care of activities of daily living.  No chest pain orthopnea or PND.  She leads a sedentary lifestyle and ambulates with a walker.  Past Medical History:  Diagnosis Date  . Anxiety   . Arthritis   . Back pain   . Breast cancer ([place])   . Carpal tunnel syndrome   . Coronary artery disease 2005   stent placed  . Depression   . Diabetes mellitus without complication (HCC)    diet  controlled.   . Fatigue   . GERD (gastroesophageal reflux disease)    "years ago" but still takes Prilosec  . Glaucoma    left eye  . History of kidney stones   . Hypertension   . Hypothyroidism   . Restless leg syndrome   . Seizures ([place])    as a baby    Past Surgical History:  Procedure Laterality Date  . ABDOMINAL HYSTERECTOMY    . ANGIOPLASTY     x2  . BREAST LUMPECTOMY Left 2002  . BREAST LUMPECTOMY Right 2017  . BREAST LUMPECTOMY WITH NEEDLE LOCALIZATION Right 07/05/2013   Procedure: RIGHT BREAST WIRE GUIDED LUMPECTOMY ;  Surgeon: Rolm Bookbinder, MD;  Location: [place];  Service: General;  Laterality: Right;  . CARPAL TUNNEL RELEASE     bilateral  . COLONOSCOPY    . CORONARY ANGIOPLASTY  2005   stent  . EYE SURGERY Bilateral    cataracts  . HERNIA REPAIR    . INSERTION OF MESH N/A 06/11/2016   Procedure: INSERTION OF MESH;  Surgeon: Rolm Bookbinder, MD;  Location: [place];  Service: General;  Laterality: N/A;  . LAPAROSCOPIC INCISIONAL / UMBILICAL / [place]  06/11/2016   UHR w/mesh  . LUMBAR LAMINECTOMY/DECOMPRESSION MICRODISCECTOMY N/A 12/17/2017   Procedure: LUMBAR L3-S1 DECOMPRESSION WITH IN SITU FUSION;  Surgeon: Melina Schools, MD;  Location: [place];  Service: Orthopedics;  Laterality: N/A;  . MENISCUS REPAIR  left  . PARATHYROIDECTOMY    . RE-EXCISION OF BREAST CANCER,SUPERIOR MARGINS Right 08/01/2013   Procedure: RE-EXCISION OF BREAST CANCER MARGIN;  Surgeon: Rolm Bookbinder, MD;  Location: [place];  Service: General;  Laterality: Right;  . TONSILLECTOMY    . UMBILICAL HERNIA REPAIR N/A 06/11/2016   Procedure: LAPAROSCOPIC UMBILICAL HERNIA;  Surgeon: Rolm Bookbinder, MD;  Location: [place];  Service: General;  Laterality: N/A;    Current Medications: Current Meds  Medication Sig  . acetaminophen (TYLENOL) 500 MG tablet Take 2 tablets (1,000 mg total) by mouth every 8 (eight) hours as needed for mild pain or moderate pain. And as needed every 6  hours.  Marland Kitchen atorvastatin (LIPITOR) 10 MG tablet 10 mg daily.  Marland Kitchen buPROPion (WELLBUTRIN XL) 150 MG 24 hr tablet 150 mg daily.  . clonazePAM (KLONOPIN) 1 MG tablet Take 1 mg by mouth 2 (two) times daily as needed for anxiety (scheduled daily at 1100). 1100  . diltiazem (CARDIZEM CD) 120 MG 24 hr capsule Take 1 capsule (120 mg total) by mouth daily. (Patient taking differently: Take 120 mg by mouth every evening. )  . donepezil (ARICEPT) 5 MG tablet Take 5 mg by mouth every evening.  Marland Kitchen HYDROcodone-acetaminophen (NORCO/VICODIN) 5-325 MG tablet Take 1 tablet by mouth every 6 (six) hours as needed for moderate pain.  . hydroxypropyl methylcellulose / hypromellose (ISOPTO TEARS / GONIOVISC) 2.5 % ophthalmic solution Place 1 drop into both eyes 2 (two) times daily.  Marland Kitchen latanoprost (XALATAN) 0.005 % ophthalmic solution Place 1 drop into both eyes at bedtime.  Marland Kitchen levothyroxine (SYNTHROID, LEVOTHROID) 75 MCG tablet Take 75 mcg by mouth daily before breakfast.  . lisinopril (PRINIVIL,ZESTRIL) 20 MG tablet Take 20 mg by mouth daily.  . magnesium oxide (MAG-OX) 400 (241.3 Mg) MG tablet Take 1 tablet (400 mg total) by mouth daily.  . metoprolol succinate (TOPROL-XL) 50 MG 24 hr tablet Take 50 mg by mouth every evening. Take with or immediately following a meal.   . omeprazole (PRILOSEC) 40 MG capsule Take 40 mg by mouth daily.  . polyethylene glycol (MIRALAX / GLYCOLAX) 17 g packet Take 17 g by mouth daily.  . primidone (MYSOLINE) 50 MG tablet Take 1 tablet by mouth daily.  . risperiDONE (RISPERDAL) 0.25 MG tablet 0.125 mg 2 (two) times daily.  Marland Kitchen rOPINIRole (REQUIP) 0.25 MG tablet Take 0.5 mg by mouth at bedtime.  . sertraline (ZOLOFT) 100 MG tablet 100 mg at bedtime.  . [DISCONTINUED] gabapentin (NEURONTIN) 300 MG capsule 300 mg 3 (three) times daily.     Allergies:   Cortisone   Social History   Socioeconomic History  . Marital status: Married    Spouse name: Not on file  . Number of children: Not on file   . Years of education: Not on file  . Highest education level: Not on file  Occupational History  . Not on file  Social Needs  . Financial resource strain: Not on file  . Food insecurity    Worry: Not on file    Inability: Not on file  . Transportation needs    Medical: Not on file    Non-medical: Not on file  Tobacco Use  . Smoking status: Never Smoker  . Smokeless tobacco: Never Used  Substance and Sexual Activity  . Alcohol use: Yes    Comment: socially  . Drug use: No  . Sexual activity: Not Currently  Lifestyle  . Physical activity    Days per week: Not on file  Minutes per session: Not on file  . Stress: Not on file  Relationships  . Social Herbalist on phone: Not on file    Gets together: Not on file    Attends religious service: Not on file    Active member of club or organization: Not on file    Attends meetings of clubs or organizations: Not on file    Relationship status: Not on file  Other Topics Concern  . Not on file  Social History Narrative  . Not on file     Family History: The patient's family history includes Breast cancer (age of onset: 68) in her maternal aunt; Cancer in her sister; Cancer (age of onset: 37) in her father; Cancer (age of onset: 68) in her mother; Cancer (age of onset: 101) in her maternal grandmother.  ROS:   Please see the history of present illness.    All other systems reviewed and are negative.  EKGs/Labs/Other Studies Reviewed:    The following studies were reviewed today: I reviewed records from previous visit   Recent Labs: 12/06/2018: ALT 15; BUN 19; Creatinine, Ser 0.91; Hemoglobin 13.2; Platelets 332; Potassium 5.0; Sodium 142; TSH 2.420  Recent Lipid Panel    Component Value Date/Time   CHOL 113 12/06/2018 0901   TRIG 251 (H) 12/06/2018 0901   HDL 39 (L) 12/06/2018 0901   CHOLHDL 2.9 12/06/2018 0901   CHOLHDL 2.2 02/04/2017 0235   VLDL 14 02/04/2017 0235   LDLCALC 24 12/06/2018 0901     Physical Exam:    VS:  Temp (!) 97 F (36.1 C)   Ht 5' (1.524 m)   Wt 169 lb (76.7 kg)   BMI 33.01 kg/m     Wt Readings from Last 3 Encounters:  06/29/19 169 lb (76.7 kg)  05/11/19 167 lb (75.8 kg)  12/06/18 167 lb (75.8 kg)     GEN: Patient is in no acute distress HEENT: Normal NECK: No JVD; No carotid bruits LYMPHATICS: No lymphadenopathy CARDIAC: Hear sounds regular, 2/6 systolic murmur at the apex. RESPIRATORY:  Clear to auscultation without rales, wheezing or rhonchi  ABDOMEN: Soft, non-tender, non-distended MUSCULOSKELETAL:  No edema; No deformity  SKIN: Warm and dry NEUROLOGIC:  Alert and oriented x 3 PSYCHIATRIC:  Normal affect   Signed, Jenean Lindau, MD  06/29/2019 11:29 AM    [facility]

## 2019-06-29 NOTE — Patient Instructions (Signed)
Medication Instructions:  Your physician recommends that you continue on your current medications as directed. Please refer to the Current Medication list given to you today.  If you need a refill on your cardiac medications before your next appointment, please call your pharmacy.   Lab work: Your physician recommends that you have a BMP, CBC, TSH, hepatic and lipid drawn today  If you have labs (blood work) drawn today and your tests are completely normal, you will receive your results only by: Marland Kitchen MyChart Message (if you have MyChart) OR . A paper copy in the mail If you have any lab test that is abnormal or we need to change your treatment, we will call you to review the results.  Testing/Procedures: NONE  Follow-Up: At [facility], you and your health needs are our priority.  As part of our continuing mission to provide you with exceptional heart care, we have created designated Provider Care Teams.  These Care Teams include your primary Cardiologist (physician) and Advanced Practice Providers (APPs -  Physician Assistants and Nurse Practitioners) who all work together to provide you with the care you need, when you need it. You will need a follow up appointment in 6 months.

## 2019-06-29 NOTE — Addendum Note (Signed)
Addended by: Beckey Rutter on: 06/29/2019 11:51 AM   Modules accepted: Orders

## 2019-06-30 LAB — CBC
Hematocrit: 39.4 % (ref 34.0–46.6)
Hemoglobin: 13.1 g/dL (ref 11.1–15.9)
MCH: 32.4 pg (ref 26.6–33.0)
MCHC: 33.2 g/dL (ref 31.5–35.7)
MCV: 98 fL — ABNORMAL HIGH (ref 79–97)
Platelets: 317 10*3/uL (ref 150–450)
RBC: 4.04 x10E6/uL (ref 3.77–5.28)
RDW: 14 % (ref 11.7–15.4)
WBC: 6.4 10*3/uL (ref 3.4–10.8)

## 2019-06-30 LAB — HEPATIC FUNCTION PANEL
ALT: 11 IU/L (ref 0–32)
AST: 14 IU/L (ref 0–40)
Albumin: 4.1 g/dL (ref 3.6–4.6)
Alkaline Phosphatase: 75 IU/L (ref 39–117)
Bilirubin Total: 0.3 mg/dL (ref 0.0–1.2)
Bilirubin, Direct: 0.09 mg/dL (ref 0.00–0.40)
Total Protein: 6.2 g/dL (ref 6.0–8.5)

## 2019-06-30 LAB — TSH: TSH: 4.01 u[IU]/mL (ref 0.450–4.500)

## 2019-06-30 LAB — BASIC METABOLIC PANEL
BUN/Creatinine Ratio: 17 (ref 12–28)
BUN: 20 mg/dL (ref 8–27)
CO2: 27 mmol/L (ref 20–29)
Calcium: 9.1 mg/dL (ref 8.7–10.3)
Chloride: 101 mmol/L (ref 96–106)
Creatinine, Ser: 1.21 mg/dL — ABNORMAL HIGH (ref 0.57–1.00)
GFR calc Af Amer: 47 mL/min/{1.73_m2} — ABNORMAL LOW (ref >59)
GFR calc non Af Amer: 41 mL/min/{1.73_m2} — ABNORMAL LOW (ref >59)
Glucose: 103 mg/dL — ABNORMAL HIGH (ref 65–99)
Potassium: 4.5 mmol/L (ref 3.5–5.2)
Sodium: 141 mmol/L (ref 134–144)

## 2019-06-30 LAB — LIPID PANEL
Chol/HDL Ratio: 2.9 ratio (ref 0.0–4.4)
Cholesterol, Total: 134 mg/dL (ref 100–199)
HDL: 46 mg/dL (ref >39)
LDL Chol Calc (NIH): 49 mg/dL (ref 0–99)
Triglycerides: 252 mg/dL — ABNORMAL HIGH (ref 0–149)
VLDL Cholesterol Cal: 39 mg/dL (ref 5–40)

## 2019-07-19 ENCOUNTER — Telehealth: Payer: Self-pay

## 2019-07-19 NOTE — Telephone Encounter (Signed)
-----   Message from Jenean Lindau, MD sent at 06/30/2019  8:28 AM EDT ----- Triglycerides are elevated and she needs to get her diet better and lower in carbohydrates and fatty foods.  Recheck in 4 months.  She needs to keep herself well-hydrated and follow-up with her Chem-7 with her primary care physician. Jenean Lindau, MD 06/30/2019 8:27 AM

## 2019-07-19 NOTE — Telephone Encounter (Signed)
Results relayed, patient states she will try to work on diet but its kinda hard considering her food is served by facility. She will try eat less of the carbs that are offered during meals. Copy sent to PCP.

## 2019-07-26 ENCOUNTER — Other Ambulatory Visit: Payer: Self-pay | Admitting: Hematology and Oncology

## 2019-07-26 DIAGNOSIS — Z853 Personal history of malignant neoplasm of breast: Secondary | ICD-10-CM

## 2019-08-16 ENCOUNTER — Ambulatory Visit: Payer: Self-pay | Admitting: Orthopedic Surgery

## 2019-08-19 ENCOUNTER — Telehealth: Payer: Self-pay | Admitting: Adult Health

## 2019-08-19 NOTE — Telephone Encounter (Signed)
Returned patient's phone call regarding cancelling an appointment, per 11/13 scheduling message appointment has been cancelled.

## 2019-08-22 ENCOUNTER — Encounter (HOSPITAL_COMMUNITY): Payer: Self-pay

## 2019-08-22 NOTE — Patient Instructions (Addendum)
DUE TO COVID-19 ONLY ONE VISITOR IS ALLOWED TO COME WITH YOU AND STAY IN THE WAITING ROOM ONLY DURING PRE OP AND PROCEDURE. THE ONE VISITOR MAY VISIT WITH YOU IN YOUR PRIVATE ROOM DURING VISITING HOURS ONLY!!   COVID SWAB TESTING COMPLETED ON:  Wednesday, Nov. 18, 2020  (Must self quarantine after testing. Follow instructions on handout.)             Your procedure is scheduled on: Thursday, Nov. 19, 2020   Report to [facility] Main  Entrance   Report to Short Stay at 5:30 AM   Call this number if you have problems the morning of surgery [phone]   Do not eat food:After Midnight.   May have liquids until 4:30 AM day of surgery   CLEAR LIQUID DIET  Foods Allowed                                                                     Foods Excluded  Water, Black Coffee and tea, regular and decaf                             liquids that you cannot  Plain Jell-O in any flavor  (No red)                                           see through such as: Fruit ices (not with fruit pulp)                                     milk, soups, orange juice  Iced Popsicles (No red)                                    All solid food Carbonated beverages, regular and diet                                    Apple juices Sports drinks like Gatorade (No red) Lightly seasoned clear broth or consume(fat free) Sugar, honey syrup  Sample Menu Breakfast                                Lunch                                     Supper Cranberry juice                    Beef broth                            Chicken broth Jell-O  Grape juice                           Apple juice Coffee or tea                        Jell-O                                      Popsicle                                                Coffee or tea                        Coffee or tea   Complete one G2 drink the morning of surgery at 4:30 AM the day of surgery.   Brush your teeth the  morning of surgery.   Do NOT smoke after Midnight   Take these medicines the morning of surgery with A SIP OF WATER: Bupropion, Clonazepam, Gabapentin, Levothyroxine, Omeprazole, Primidone, Risperidone  DO NOT TAKE ANY DIABETIC MEDICATIONS DAY OF YOUR SURGERY                               You may not have any metal on your body including hair pins, jewelry, and body piercings             Do not wear make-up, lotions, powders, perfumes/cologne, or deodorant             Do not wear nail polish.  Do not shave  48 hours prior to surgery.                Do not bring valuables to the hospital. [place].   Contacts, dentures or bridgework may not be worn into surgery.   Bring small overnight bag day of surgery.    Special Instructions: Bring a copy of your healthcare power of attorney and living will documents         the day of surgery if you haven't scanned them in before.              Please read over the following fact sheets you were given:  [facility] - Preparing for Surgery Before surgery, you can play an important role.  Because skin is not sterile, your skin needs to be as free of germs as possible.  You can reduce the number of germs on your skin by washing with CHG (chlorahexidine gluconate) soap before surgery.  CHG is an antiseptic cleaner which kills germs and bonds with the skin to continue killing germs even after washing. Please DO NOT use if you have an allergy to CHG or antibacterial soaps.  If your skin becomes reddened/irritated stop using the CHG and inform your nurse when you arrive at Short Stay. Do not shave (including legs and underarms) for at least 48 hours prior to the first CHG shower.  You may shave your face/neck.  Please follow these instructions carefully:  1.  Shower with CHG Soap the  night before surgery and the  morning of surgery.  2.  If you choose to wash your hair, wash your hair first as usual with  your normal  shampoo.  3.  After you shampoo, rinse your hair and body thoroughly to remove the shampoo.                             4.  Use CHG as you would any other liquid soap.  You can apply chg directly to the skin and wash.  Gently with a scrungie or clean washcloth.  5.  Apply the CHG Soap to your body ONLY FROM THE NECK DOWN.   Do   not use on face/ open                           Wound or open sores. Avoid contact with eyes, ears mouth and   genitals (private parts).                       Wash face,  Genitals (private parts) with your normal soap.             6.  Wash thoroughly, paying special attention to the area where your    surgery  will be performed.  7.  Thoroughly rinse your body with warm water from the neck down.  8.  DO NOT shower/wash with your normal soap after using and rinsing off the CHG Soap.                9.  Pat yourself dry with a clean towel.            10.  Wear clean pajamas.            11.  Place clean sheets on your bed the night of your first shower and do not  sleep with pets. Day of Surgery : Do not apply any lotions/deodorants the morning of surgery.  Please wear clean clothes to the hospital/surgery center.  FAILURE TO FOLLOW THESE INSTRUCTIONS MAY RESULT IN THE CANCELLATION OF YOUR SURGERY  PATIENT SIGNATURE_________________________________  NURSE SIGNATURE__________________________________  ________________________________________________________________________   Adam Phenix  An incentive spirometer is a tool that can help keep your lungs clear and active. This tool measures how well you are filling your lungs with each breath. Taking long deep breaths may help reverse or decrease the chance of developing breathing (pulmonary) problems (especially infection) following:  A long period of time when you are unable to move or be active. BEFORE THE PROCEDURE   If the spirometer includes an indicator to show your best effort, your nurse or  respiratory therapist will set it to a desired goal.  If possible, sit up straight or lean slightly forward. Try not to slouch.  Hold the incentive spirometer in an upright position. INSTRUCTIONS FOR USE  1. Sit on the edge of your bed if possible, or sit up as far as you can in bed or on a chair. 2. Hold the incentive spirometer in an upright position. 3. Breathe out normally. 4. Place the mouthpiece in your mouth and seal your lips tightly around it. 5. Breathe in slowly and as deeply as possible, raising the piston or the ball toward the top of the column. 6. Hold your breath for 3-5 seconds or for as long as possible. Allow the piston or ball to fall to  the bottom of the column. 7. Remove the mouthpiece from your mouth and breathe out normally. 8. Rest for a few seconds and repeat Steps 1 through 7 at least 10 times every 1-2 hours when you are awake. Take your time and take a few normal breaths between deep breaths. 9. The spirometer may include an indicator to show your best effort. Use the indicator as a goal to work toward during each repetition. 10. After each set of 10 deep breaths, practice coughing to be sure your lungs are clear. If you have an incision (the cut made at the time of surgery), support your incision when coughing by placing a pillow or rolled up towels firmly against it. Once you are able to get out of bed, walk around indoors and cough well. You may stop using the incentive spirometer when instructed by your caregiver.  RISKS AND COMPLICATIONS  Take your time so you do not get dizzy or light-headed.  If you are in pain, you may need to take or ask for pain medication before doing incentive spirometry. It is harder to take a deep breath if you are having pain. AFTER USE  Rest and breathe slowly and easily.  It can be helpful to keep track of a log of your progress. Your caregiver can provide you with a simple table to help with this. If you are using the  spirometer at home, follow these instructions: [place] IF:   You are having difficultly using the spirometer.  You have trouble using the spirometer as often as instructed.  Your pain medication is not giving enough relief while using the spirometer.  You develop fever of 100.5 F (38.1 C) or higher. SEEK IMMEDIATE MEDICAL CARE IF:   You cough up bloody sputum that had not been present before.  You develop fever of 102 F (38.9 C) or greater.  You develop worsening pain at or near the incision site. MAKE SURE YOU:   Understand these instructions.  Will watch your condition.  Will get help right away if you are not doing well or get worse. Document Released: 02/02/2007 Document Revised: 12/15/2011 Document Reviewed: 04/05/2007 ExitCare Patient Information 2014 Exit[employer], [place].   ________________________________________________________________________  WHAT IS A BLOOD TRANSFUSION? Blood Transfusion Information  A transfusion is the replacement of blood or some of its parts. Blood is made up of multiple cells which provide different functions.  Red blood cells carry oxygen and are used for blood loss replacement.  White blood cells fight against infection.  Platelets control bleeding.  Plasma helps clot blood.  Other blood products are available for specialized needs, such as hemophilia or other clotting disorders. BEFORE THE TRANSFUSION  Who gives blood for transfusions?   Healthy volunteers who are fully evaluated to make sure their blood is safe. This is blood bank blood. Transfusion therapy is the safest it has ever been in the practice of medicine. Before blood is taken from a donor, a complete history is taken to make sure that person has no history of diseases nor engages in risky social behavior (examples are intravenous drug use or sexual activity with multiple partners). The donor's travel history is screened to minimize risk of transmitting  infections, such as malaria. The donated blood is tested for signs of infectious diseases, such as HIV and hepatitis. The blood is then tested to be sure it is compatible with you in order to minimize the chance of a transfusion reaction. If you or a relative donates blood,  this is often done in anticipation of surgery and is not appropriate for emergency situations. It takes many days to process the donated blood. RISKS AND COMPLICATIONS Although transfusion therapy is very safe and saves many lives, the main dangers of transfusion include:   Getting an infectious disease.  Developing a transfusion reaction. This is an allergic reaction to something in the blood you were given. Every precaution is taken to prevent this. The decision to have a blood transfusion has been considered carefully by your caregiver before blood is given. Blood is not given unless the benefits outweigh the risks. AFTER THE TRANSFUSION  Right after receiving a blood transfusion, you will usually feel much better and more energetic. This is especially true if your red blood cells have gotten low (anemic). The transfusion raises the level of the red blood cells which carry oxygen, and this usually causes an energy increase.  The nurse administering the transfusion will monitor you carefully for complications. HOME CARE INSTRUCTIONS  No special instructions are needed after a transfusion. You may find your energy is better. Speak with your caregiver about any limitations on activity for underlying diseases you may have. SEEK MEDICAL CARE IF:   Your condition is not improving after your transfusion.  You develop redness or irritation at the intravenous (IV) site. SEEK IMMEDIATE MEDICAL CARE IF:  Any of the following symptoms occur over the next 12 hours:  Shaking chills.  You have a temperature by mouth above 102 F (38.9 C), not controlled by medicine.  Chest, back, or muscle pain.  People around you feel you are  not acting correctly or are confused.  Shortness of breath or difficulty breathing.  Dizziness and fainting.  You get a rash or develop hives.  You have a decrease in urine output.  Your urine turns a dark color or changes to pink, red, or brown. Any of the following symptoms occur over the next 10 days:  You have a temperature by mouth above 102 F (38.9 C), not controlled by medicine.  Shortness of breath.  Weakness after normal activity.  The white part of the eye turns yellow (jaundice).  You have a decrease in the amount of urine or are urinating less often.  Your urine turns a dark color or changes to pink, red, or brown. Document Released: 09/19/2000 Document Revised: 12/15/2011 Document Reviewed: 05/08/2008 [facility] Patient Information 2014 [place], [place].  _______________________________________________________________________

## 2019-08-23 ENCOUNTER — Other Ambulatory Visit (HOSPITAL_COMMUNITY)
Admission: RE | Admit: 2019-08-23 | Discharge: 2019-08-23 | Disposition: A | Discharge status: Home / Self Care | Payer: Medicare Other | Source: Ambulatory Visit | Attending: Orthopedic Surgery | Admitting: Orthopedic Surgery

## 2019-08-23 DIAGNOSIS — Z20828 Contact with and (suspected) exposure to other viral communicable diseases: Secondary | ICD-10-CM | POA: Insufficient documentation

## 2019-08-23 DIAGNOSIS — Z01812 Encounter for preprocedural laboratory examination: Secondary | ICD-10-CM | POA: Insufficient documentation

## 2019-08-23 LAB — SARS CORONAVIRUS 2 (TAT 6-24 HRS): SARS Coronavirus 2: NEGATIVE

## 2019-08-24 ENCOUNTER — Encounter (HOSPITAL_COMMUNITY)
Admission: RE | Admit: 2019-08-24 | Discharge: 2019-08-24 | Disposition: A | Discharge status: Home / Self Care | Payer: Medicare Other | Source: Ambulatory Visit | Attending: Orthopedic Surgery | Admitting: Orthopedic Surgery

## 2019-08-24 ENCOUNTER — Other Ambulatory Visit: Payer: Self-pay

## 2019-08-24 ENCOUNTER — Ambulatory Visit: Payer: Self-pay | Admitting: Orthopedic Surgery

## 2019-08-24 ENCOUNTER — Encounter (HOSPITAL_COMMUNITY): Payer: Self-pay | Admitting: Anesthesiology

## 2019-08-24 ENCOUNTER — Encounter (HOSPITAL_COMMUNITY): Payer: Self-pay

## 2019-08-24 DIAGNOSIS — H409 Unspecified glaucoma: Secondary | ICD-10-CM | POA: Insufficient documentation

## 2019-08-24 DIAGNOSIS — R569 Unspecified convulsions: Secondary | ICD-10-CM | POA: Insufficient documentation

## 2019-08-24 DIAGNOSIS — Z9013 Acquired absence of bilateral breasts and nipples: Secondary | ICD-10-CM | POA: Insufficient documentation

## 2019-08-24 DIAGNOSIS — E119 Type 2 diabetes mellitus without complications: Secondary | ICD-10-CM | POA: Insufficient documentation

## 2019-08-24 DIAGNOSIS — M1712 Unilateral primary osteoarthritis, left knee: Secondary | ICD-10-CM | POA: Insufficient documentation

## 2019-08-24 DIAGNOSIS — I471 Supraventricular tachycardia: Secondary | ICD-10-CM | POA: Insufficient documentation

## 2019-08-24 DIAGNOSIS — F419 Anxiety disorder, unspecified: Secondary | ICD-10-CM | POA: Insufficient documentation

## 2019-08-24 DIAGNOSIS — F329 Major depressive disorder, single episode, unspecified: Secondary | ICD-10-CM | POA: Insufficient documentation

## 2019-08-24 DIAGNOSIS — Z9861 Coronary angioplasty status: Secondary | ICD-10-CM | POA: Insufficient documentation

## 2019-08-24 DIAGNOSIS — I2510 Atherosclerotic heart disease of native coronary artery without angina pectoris: Secondary | ICD-10-CM | POA: Insufficient documentation

## 2019-08-24 DIAGNOSIS — E7800 Pure hypercholesterolemia, unspecified: Secondary | ICD-10-CM | POA: Insufficient documentation

## 2019-08-24 DIAGNOSIS — I10 Essential (primary) hypertension: Secondary | ICD-10-CM | POA: Insufficient documentation

## 2019-08-24 DIAGNOSIS — Z01812 Encounter for preprocedural laboratory examination: Secondary | ICD-10-CM | POA: Insufficient documentation

## 2019-08-24 DIAGNOSIS — K219 Gastro-esophageal reflux disease without esophagitis: Secondary | ICD-10-CM | POA: Insufficient documentation

## 2019-08-24 DIAGNOSIS — Z853 Personal history of malignant neoplasm of breast: Secondary | ICD-10-CM | POA: Insufficient documentation

## 2019-08-24 DIAGNOSIS — G2581 Restless legs syndrome: Secondary | ICD-10-CM | POA: Insufficient documentation

## 2019-08-24 DIAGNOSIS — Z79899 Other long term (current) drug therapy: Secondary | ICD-10-CM | POA: Insufficient documentation

## 2019-08-24 DIAGNOSIS — Z90710 Acquired absence of both cervix and uterus: Secondary | ICD-10-CM | POA: Insufficient documentation

## 2019-08-24 DIAGNOSIS — Z79890 Hormone replacement therapy (postmenopausal): Secondary | ICD-10-CM | POA: Insufficient documentation

## 2019-08-24 DIAGNOSIS — E039 Hypothyroidism, unspecified: Secondary | ICD-10-CM | POA: Insufficient documentation

## 2019-08-24 DIAGNOSIS — I440 Atrioventricular block, first degree: Secondary | ICD-10-CM | POA: Insufficient documentation

## 2019-08-24 HISTORY — DX: Pure hypercholesterolemia, unspecified: E78.00

## 2019-08-24 HISTORY — DX: Supraventricular tachycardia: I47.1

## 2019-08-24 HISTORY — DX: Other complications of anesthesia, initial encounter: T88.59XA

## 2019-08-24 HISTORY — DX: Hypertrophy of nasal turbinates: J34.3

## 2019-08-24 HISTORY — DX: Umbilical hernia without obstruction or gangrene: K42.9

## 2019-08-24 HISTORY — DX: Supraventricular tachycardia, unspecified: I47.10

## 2019-08-24 HISTORY — DX: Diverticulosis of intestine, part unspecified, without perforation or abscess without bleeding: K57.90

## 2019-08-24 LAB — CBC
HCT: 42 % (ref 36.0–46.0)
Hemoglobin: 13.3 g/dL (ref 12.0–15.0)
MCH: 32 pg (ref 26.0–34.0)
MCHC: 31.7 g/dL (ref 30.0–36.0)
MCV: 101 fL — ABNORMAL HIGH (ref 80.0–100.0)
Platelets: 303 10*3/uL (ref 150–400)
RBC: 4.16 MIL/uL (ref 3.87–5.11)
RDW: 14.2 % (ref 11.5–15.5)
WBC: 7.1 10*3/uL (ref 4.0–10.5)
nRBC: 0 % (ref 0.0–0.2)

## 2019-08-24 LAB — URINALYSIS, ROUTINE W REFLEX MICROSCOPIC
Bilirubin Urine: NEGATIVE
Glucose, UA: NEGATIVE mg/dL
Ketones, ur: NEGATIVE mg/dL
Leukocytes,Ua: NEGATIVE
Nitrite: NEGATIVE
Protein, ur: NEGATIVE mg/dL
Specific Gravity, Urine: 1.012 (ref 1.005–1.030)
pH: 6 (ref 5.0–8.0)

## 2019-08-24 LAB — ABO/RH: ABO/RH(D): A NEG

## 2019-08-24 LAB — COMPREHENSIVE METABOLIC PANEL
ALT: 16 U/L (ref 0–44)
AST: 19 U/L (ref 15–41)
Albumin: 4.2 g/dL (ref 3.5–5.0)
Alkaline Phosphatase: 69 U/L (ref 38–126)
Anion gap: 8 (ref 5–15)
BUN: 21 mg/dL (ref 8–23)
CO2: 28 mmol/L (ref 22–32)
Calcium: 8.6 mg/dL — ABNORMAL LOW (ref 8.9–10.3)
Chloride: 100 mmol/L (ref 98–111)
Creatinine, Ser: 1.19 mg/dL — ABNORMAL HIGH (ref 0.44–1.00)
GFR calc Af Amer: 48 mL/min — ABNORMAL LOW (ref >60)
GFR calc non Af Amer: 42 mL/min — ABNORMAL LOW (ref >60)
Glucose, Bld: 104 mg/dL — ABNORMAL HIGH (ref 70–99)
Potassium: 3.8 mmol/L (ref 3.5–5.1)
Sodium: 136 mmol/L (ref 135–145)
Total Bilirubin: 0.7 mg/dL (ref 0.3–1.2)
Total Protein: 6.9 g/dL (ref 6.5–8.1)

## 2019-08-24 LAB — PROTIME-INR
INR: 1 (ref 0.8–1.2)
Prothrombin Time: 13.1 seconds (ref 11.4–15.2)

## 2019-08-24 LAB — SURGICAL PCR SCREEN
MRSA, PCR: NEGATIVE
Staphylococcus aureus: NEGATIVE

## 2019-08-24 LAB — HEMOGLOBIN A1C
Hgb A1c MFr Bld: 6 % — ABNORMAL HIGH (ref 4.8–5.6)
Mean Plasma Glucose: 125.5 mg/dL

## 2019-08-24 NOTE — Anesthesia Preprocedure Evaluation (Addendum)
Anesthesia Evaluation  Patient identified by MRN, date of birth, ID band Patient awake    Reviewed: Allergy & Precautions, NPO status , Patient's Chart, lab work & pertinent test results  Airway Mallampati: I  TM Distance: >3 FB Neck ROM: Full    Dental  (+) Partial Upper   Pulmonary    breath sounds clear to auscultation       Cardiovascular hypertension, Pt. on medications and Pt. on home beta blockers + CAD and + Cardiac Stents  + dysrhythmias  Rhythm:Regular Rate:Normal     Neuro/Psych Seizures -,  PSYCHIATRIC DISORDERS Anxiety Depression Dementia    GI/Hepatic Neg liver ROS, GERD  Medicated,  Endo/Other  diabetesHypothyroidism   Renal/GU negative Renal ROS     Musculoskeletal  (+) Arthritis ,   Abdominal Normal abdominal exam  (+)   Peds  Hematology   Anesthesia Other Findings   Reproductive/Obstetrics                           Lab Results  Component Value Date   WBC 7.1 08/24/2019   HGB 13.3 08/24/2019   HCT 42.0 08/24/2019   MCV 101.0 (H) 08/24/2019   PLT 303 08/24/2019   Lab Results  Component Value Date   INR 1.0 08/24/2019   INR 1.09 02/04/2017   Myocardial Perfusion Test:  Nuclear stress EF: 66%.  There was no ST segment deviation noted during stress.  The study is normal.  This is a low risk study.  The left ventricular ejection fraction is hyperdynamic (>65%).  No change from prior study.  Anesthesia Physical Anesthesia Plan  ASA: III  Anesthesia Plan: Spinal   Post-op Pain Management:  Regional for Post-op pain   Induction: Intravenous  PONV Risk Score and Plan: 3 and Ondansetron, Dexamethasone and Treatment may vary due to age or medical condition  Airway Management Planned: Natural Airway and Simple Face Mask  Additional Equipment: None  Intra-op Plan:   Post-operative Plan:   Informed Consent: I have reviewed the patients History and  Physical, chart, labs and discussed the procedure including the risks, benefits and alternatives for the proposed anesthesia with the patient or authorized representative who has indicated his/her understanding and acceptance.       Plan Discussed with: CRNA  Anesthesia Plan Comments:        Anesthesia Quick Evaluation

## 2019-08-24 NOTE — Progress Notes (Signed)
Anesthesia Chart Review   Case: [id number] Date/Time: 08/25/19 0715   Procedure: COMPUTER ASSISTED TOTAL KNEE ARTHROPLASTY (Left Knee)   Anesthesia type: Spinal   Pre-op diagnosis: degenerative joint disease left knee   Location: [facility] ROOM 08 / [facility]   Surgeon: Rod Can, MD      DISCUSSION:85 y.o. never smoker with h/o HTN, hypothyroidism, GERD, CAD (stent 2005), left knee djd scheduled for above procedure 08/25/2019 with Dr. Rod Can.   Low risk stress test 05/13/2019.  Pt last seen by cardiologist, Dr. Jyl Heinz, 06/29/2019.  Stable at this visit with 4 month follow up recommended to monitor triglycerides.    Anticipate pt can proceed with planned procedure barring acute status change.   VS: BP (!) 149/79   Pulse 75   Temp 36.7 C (Oral)   Resp 16   Ht 5' (1.524 m)   Wt 78.5 kg   SpO2 95%   BMI 33.79 kg/m   PROVIDERS: Javier Glazier, MD is PCP   Jyl Heinz, MD is Cardiologist  LABS: Labs reviewed: Acceptable for surgery. (all labs ordered are listed, but only abnormal results are displayed)  Labs Reviewed  CBC - Abnormal; Notable for the following components:      Result Value   MCV 101.0 (*)    All other components within normal limits  COMPREHENSIVE METABOLIC PANEL - Abnormal; Notable for the following components:   Glucose, Bld 104 (*)    Creatinine, Ser 1.19 (*)    Calcium 8.6 (*)    GFR calc non Af Amer 42 (*)    GFR calc Af Amer 48 (*)    All other components within normal limits  URINALYSIS, ROUTINE W REFLEX MICROSCOPIC - Abnormal; Notable for the following components:   Hgb urine dipstick SMALL (*)    Bacteria, UA RARE (*)    All other components within normal limits  HEMOGLOBIN A1C - Abnormal; Notable for the following components:   Hgb A1c MFr Bld 6.0 (*)    All other components within normal limits  SURGICAL PCR SCREEN  PROTIME-INR  TYPE AND SCREEN  ABO/RH     IMAGES:   EKG: 12/06/2018 Rate 59 bpm Sinus bradycardia with  1st degree AB block   CV: Myocardial Perfusion Imaging 05/13/2019   Nuclear stress EF: 66%.  There was no ST segment deviation noted during stress.  The study is normal.  This is a low risk study.  The left ventricular ejection fraction is hyperdynamic (>65%).  No change from prior study.   Normal study, low risk. TID was 1.25 so cannot exclude balanced ischemia.  Echo 02/05/2017 Study Conclusions  - Left ventricle: The cavity size was normal. Systolic function was   normal. The estimated ejection fraction was in the range of 60%   to 65%. Doppler parameters are consistent with abnormal left   ventricular relaxation (grade 1 diastolic dysfunction). - Left atrium: The atrium was mildly to moderately dilated. Past Medical History:  Diagnosis Date  . 1st degree AV block 12/06/2018   Noted on EKG  . Anxiety   . Arthritis   . Back pain   . Breast cancer ([place])   . Carpal tunnel syndrome   . Complication of anesthesia    Delirium post anesthesia  . Coronary artery disease 2005   stent placed  . Depression   . Diabetes mellitus without complication (HCC)    diet controlled.   . Diverticulosis   . Fatigue   . GERD (gastroesophageal reflux  disease)    "years ago" but still takes Prilosec  . Glaucoma    left eye  . History of kidney stones   . Hypercholesteremia   . Hypertension   . Hypothyroidism   . Nasal turbinate hypertrophy   . Restless leg syndrome   . Seizures ([place])    as a baby  . SVT (supraventricular tachycardia) ([place])   . Umbilical hernia     Past Surgical History:  Procedure Laterality Date  . ABDOMINAL HYSTERECTOMY    . ANGIOPLASTY     x2  . BREAST LUMPECTOMY Left 2002  . BREAST LUMPECTOMY Right 2017  . BREAST LUMPECTOMY WITH NEEDLE LOCALIZATION Right 07/05/2013   Procedure: RIGHT BREAST WIRE GUIDED LUMPECTOMY ;  Surgeon: Rolm Bookbinder, MD;  Location: [facility];  Service: General;  Laterality: Right;  . CARPAL TUNNEL RELEASE     bilateral  .  COLONOSCOPY    . CORONARY ANGIOPLASTY  2005   stent  . EYE SURGERY Bilateral    cataracts  . HERNIA REPAIR    . INSERTION OF MESH N/A 06/11/2016   Procedure: INSERTION OF MESH;  Surgeon: Rolm Bookbinder, MD;  Location: [place];  Service: General;  Laterality: N/A;  . LAPAROSCOPIC INCISIONAL / UMBILICAL / [place]  06/11/2016   UHR w/mesh  . LUMBAR LAMINECTOMY/DECOMPRESSION MICRODISCECTOMY N/A 12/17/2017   Procedure: LUMBAR L3-S1 DECOMPRESSION WITH IN SITU FUSION;  Surgeon: Melina Schools, MD;  Location: [place] [facility];  Service: Orthopedics;  Laterality: N/A;  . MENISCUS REPAIR     left  . PARATHYROIDECTOMY    . RE-EXCISION OF BREAST CANCER,SUPERIOR MARGINS Right 08/01/2013   Procedure: RE-EXCISION OF BREAST CANCER MARGIN;  Surgeon: Rolm Bookbinder, MD;  Location: [facility];  Service: General;  Laterality: Right;  . TONSILLECTOMY    . UMBILICAL HERNIA REPAIR N/A 06/11/2016   Procedure: LAPAROSCOPIC UMBILICAL HERNIA;  Surgeon: Rolm Bookbinder, MD;  Location: [facility];  Service: General;  Laterality: N/A;    MEDICATIONS: . acetaminophen (TYLENOL) 500 MG tablet  . acetaminophen (TYLENOL) 500 MG tablet  . atorvastatin (LIPITOR) 10 MG tablet  . buPROPion (WELLBUTRIN XL) 150 MG 24 hr tablet  . clonazePAM (KLONOPIN) 1 MG tablet  . clonazePAM (KLONOPIN) 1 MG tablet  . diltiazem (CARDIZEM CD) 120 MG 24 hr capsule  . donepezil (ARICEPT) 5 MG tablet  . gabapentin (NEURONTIN) 300 MG capsule  . HYDROcodone-acetaminophen (NORCO/VICODIN) 5-325 MG tablet  . hydroxypropyl methylcellulose / hypromellose (ISOPTO TEARS / GONIOVISC) 2.5 % ophthalmic solution  . latanoprost (XALATAN) 0.005 % ophthalmic solution  . levothyroxine (SYNTHROID, LEVOTHROID) 75 MCG tablet  . lisinopril (PRINIVIL,ZESTRIL) 20 MG tablet  . magnesium oxide (MAG-OX) 400 (241.3 Mg) MG tablet  . metoprolol succinate (TOPROL-XL) 50 MG 24 hr tablet  . omeprazole (PRILOSEC) 40 MG capsule  . polyethylene glycol (MIRALAX / GLYCOLAX) 17 g  packet  . primidone (MYSOLINE) 50 MG tablet  . risperiDONE (RISPERDAL) 0.25 MG tablet  . rOPINIRole (REQUIP) 0.25 MG tablet  . sertraline (ZOLOFT) 100 MG tablet  . Vitamin D, Ergocalciferol, (DRISDOL) 1.25 MG (50000 UT) CAPS capsule   No current facility-administered medications for this encounter.      Maia Plan [facility] Pre-Surgical Testing [phone] 08/24/19  2:42 PM

## 2019-08-24 NOTE — Pre-Procedure Instructions (Signed)
PCP - Dr. Sima Matas Cardiologist - Dr. Reuel Derby. Last office visit on 06/29/2019  Chest x-ray -  EKG - 12/06/2018. Epic Stress Test - 05/11/2019 Epic ECHO -  More than two years Cardiac Cath -   Sleep Study - NA CPAP -   Fasting Blood Sugar - NA Checks Blood Sugar _____ times a day  Blood Thinner Instructions: Aspirin Instructions:NA Last Dose:  Anesthesia review:   Patient denies shortness of breath, fever, cough and chest pain at PAT appointment   Patient verbalized understanding of instructions that were given to them at the PAT appointment. Patient was also instructed that they will need to review over the PAT instructions again at home before surgery.

## 2019-08-24 NOTE — H&P (View-Only) (Signed)
TOTAL KNEE ADMISSION H&P  Patient is being admitted for left total knee arthroplasty.  Subjective:  Chief Complaint:left knee pain.  HPI: Wendy Roth, 85 y.o. female, has a history of pain and functional disability in the left knee due to arthritis and has failed non-surgical conservative treatments for greater than 12 weeks to includeviscosupplementation injections, supervised PT with diminished ADL's post treatment, use of assistive devices and bracing.  Onset of symptoms was gradual, starting 2 years ago with gradually worsening course since that time. The patient noted no past surgery on the bilaterally knee(s).  Patient currently rates pain in the left knee(s) at 3 out of 10 with activity. Patient has worsening of pain with activity and weight bearing, pain that interferes with activities of daily living, pain with passive range of motion, crepitus and joint swelling.  Patient has evidence of joint space narrowing by imaging studies. There is no active infection.  Patient Active Problem List   Diagnosis Date Noted  . Essential hypertension 12/06/2018  . Diabetes mellitus due to underlying condition with unspecified complications ([place]) 123456  . Mixed dyslipidemia 12/06/2018  . Status post lumbar spine operative procedure for decompression of spinal cord 12/17/2017  . Pain in left knee 11/20/2017  . Degenerative spondylolisthesis 11/18/2017  . Degeneration of lumbar intervertebral disc 10/30/2017  . Spinal stenosis of lumbar region 10/30/2017  . Risk for falls 06/03/2017  . Hypoxia 02/07/2017  . SVT (supraventricular tachycardia) ([place]) 02/04/2017  . HLD (hyperlipidemia) 02/04/2017  . Depression 02/04/2017  . Chronic back pain 02/04/2017  . Acute respiratory failure with hypoxia ([place]) 02/04/2017  . Hypothyroidism   . GERD (gastroesophageal reflux disease)   . Anxiety   . Memory loss of 01/27/2017  . Umbilical hernia [id number]  . Hypertrophy of nasal turbinates  09/15/2013  . Type II diabetes mellitus ([place]) 09/15/2013  . Arthropathy 04/13/2013  . Hypertension, benign 04/13/2013  . Restless legs syndrome 04/13/2013  . Family history of breast cancer 02/18/2013  . Family history of malignant neoplasm of female breast 02/18/2013  . Cancer of lower-inner quadrant of left female breast ([place]) 10/18/2012  . Coronary artery disease 10/07/2003   Past Medical History:  Diagnosis Date  . 1st degree AV block 12/06/2018   Noted on EKG  . Anxiety   . Arthritis   . Back pain   . Breast cancer ([place])   . Carpal tunnel syndrome   . Coronary artery disease 2005   stent placed  . Depression   . Diabetes mellitus without complication (HCC)    diet controlled.   . Diverticulosis   . Fatigue   . GERD (gastroesophageal reflux disease)    "years ago" but still takes Prilosec  . Glaucoma    left eye  . History of kidney stones   . Hypercholesteremia   . Hypertension   . Hypothyroidism   . Nasal turbinate hypertrophy   . Restless leg syndrome   . Seizures ([place])    as a baby  . SVT (supraventricular tachycardia) ([place])   . Umbilical hernia     Past Surgical History:  Procedure Laterality Date  . ABDOMINAL HYSTERECTOMY    . ANGIOPLASTY     x2  . BREAST LUMPECTOMY Left 2002  . BREAST LUMPECTOMY Right 2017  . BREAST LUMPECTOMY WITH NEEDLE LOCALIZATION Right 07/05/2013   Procedure: RIGHT BREAST WIRE GUIDED LUMPECTOMY ;  Surgeon: Rolm Bookbinder, MD;  Location: [facility];  Service: General;  Laterality: Right;  . CARPAL TUNNEL RELEASE  bilateral  . COLONOSCOPY    . CORONARY ANGIOPLASTY  2005   stent  . EYE SURGERY Bilateral    cataracts  . HERNIA REPAIR    . INSERTION OF MESH N/A 06/11/2016   Procedure: INSERTION OF MESH;  Surgeon: Rolm Bookbinder, MD;  Location: [facility];  Service: General;  Laterality: N/A;  . LAPAROSCOPIC INCISIONAL / UMBILICAL / [place]  06/11/2016   UHR w/mesh  . LUMBAR LAMINECTOMY/DECOMPRESSION MICRODISCECTOMY N/A  12/17/2017   Procedure: LUMBAR L3-S1 DECOMPRESSION WITH IN SITU FUSION;  Surgeon: Melina Schools, MD;  Location: [facility];  Service: Orthopedics;  Laterality: N/A;  . MENISCUS REPAIR     left  . PARATHYROIDECTOMY    . RE-EXCISION OF BREAST CANCER,SUPERIOR MARGINS Right 08/01/2013   Procedure: RE-EXCISION OF BREAST CANCER MARGIN;  Surgeon: Rolm Bookbinder, MD;  Location: [facility];  Service: General;  Laterality: Right;  . TONSILLECTOMY    . UMBILICAL HERNIA REPAIR N/A 06/11/2016   Procedure: LAPAROSCOPIC UMBILICAL HERNIA;  Surgeon: Rolm Bookbinder, MD;  Location: [facility];  Service: General;  Laterality: N/A;    Current Outpatient Medications  Medication Sig Dispense Refill Last Dose  . acetaminophen (TYLENOL) 500 MG tablet Take 2 tablets (1,000 mg total) by mouth every 8 (eight) hours as needed for mild pain or moderate pain. And as needed every 6 hours. (Patient taking differently: Take 1,000 mg by mouth 3 (three) times daily. ) 30 tablet 0 Taking  . acetaminophen (TYLENOL) 500 MG tablet Take 500 mg by mouth as needed for mild pain or moderate pain (Lower leg).     Marland Kitchen atorvastatin (LIPITOR) 10 MG tablet Take 10 mg by mouth at bedtime.    Taking  . buPROPion (WELLBUTRIN XL) 150 MG 24 hr tablet Take 150 mg by mouth daily.    Taking  . clonazePAM (KLONOPIN) 1 MG tablet Take 1 mg by mouth 2 (two) times daily.    Taking  . clonazePAM (KLONOPIN) 1 MG tablet Take 1 mg by mouth as needed (Major Depression).     Marland Kitchen diltiazem (CARDIZEM CD) 120 MG 24 hr capsule Take 1 capsule (120 mg total) by mouth daily. (Patient taking differently: Take 120 mg by mouth every evening. ) 30 capsule 2 Taking  . donepezil (ARICEPT) 5 MG tablet Take 5 mg by mouth every evening.   Taking  . gabapentin (NEURONTIN) 300 MG capsule Take 300 mg by mouth 3 (three) times daily.     Marland Kitchen HYDROcodone-acetaminophen (NORCO/VICODIN) 5-325 MG tablet Take 1 tablet by mouth every 6 (six) hours as needed for moderate pain.   Taking  . hydroxypropyl  methylcellulose / hypromellose (ISOPTO TEARS / GONIOVISC) 2.5 % ophthalmic solution Place 1 drop into both eyes 3 (three) times daily.    Taking  . latanoprost (XALATAN) 0.005 % ophthalmic solution Place 1 drop into both eyes at bedtime.   Taking  . levothyroxine (SYNTHROID, LEVOTHROID) 75 MCG tablet Take 75 mcg by mouth daily before breakfast.   Taking  . lisinopril (PRINIVIL,ZESTRIL) 20 MG tablet Take 20 mg by mouth daily.   Taking  . magnesium oxide (MAG-OX) 400 (241.3 Mg) MG tablet Take 1 tablet (400 mg total) by mouth daily. 30 tablet 1 Taking  . metoprolol succinate (TOPROL-XL) 50 MG 24 hr tablet Take 50 mg by mouth every evening. Take with or immediately following a meal.    Taking  . omeprazole (PRILOSEC) 40 MG capsule Take 40 mg by mouth daily.   Taking  . polyethylene glycol (MIRALAX /  GLYCOLAX) 17 g packet Take 17 g by mouth daily as needed for mild constipation.    Taking  . primidone (MYSOLINE) 50 MG tablet Take 50 mg by mouth 4 (four) times daily.    Taking  . risperiDONE (RISPERDAL) 0.25 MG tablet Take 0.125 mg by mouth 2 (two) times daily.    Taking  . rOPINIRole (REQUIP) 0.25 MG tablet Take 0.5 mg by mouth at bedtime.   Taking  . sertraline (ZOLOFT) 100 MG tablet Take 100 mg by mouth at bedtime.    Taking  . Vitamin D, Ergocalciferol, (DRISDOL) 1.25 MG (50000 UT) CAPS capsule Take 50,000 Units by mouth once a week.      No current facility-administered medications for this visit.    Allergies  Allergen Reactions  . Cortisone Shortness Of Breath    She blames symptoms of SOB & palpitations on shot of cortisone.    Social History   Tobacco Use  . Smoking status: Never Smoker  . Smokeless tobacco: Never Used  Substance Use Topics  . Alcohol use: Yes    Comment: socially    Family History  Problem Relation Age of Onset  . Cancer Mother 53       lung cancer   . Cancer Father 64       possible breast cancer  . Cancer Sister        lung cancer ? primary; paternal half  sister  . Breast cancer Maternal Aunt 70       ? bilateral  . Cancer Maternal Grandmother 79       prostate cancer     Review of Systems  Constitutional: Negative.   HENT: Negative.   Eyes: Negative.   Respiratory: Negative.   Cardiovascular: Negative.   Gastrointestinal: Negative.   Genitourinary: Negative.   Musculoskeletal: Positive for joint pain.  Skin: Negative.   Neurological: Negative.   Psychiatric/Behavioral: Negative.     Objective:  Physical Exam  Constitutional: She appears well-developed and well-nourished.  HENT:  Head: Normocephalic.  Eyes: Pupils are equal, round, and reactive to light.  Neck: Normal range of motion.  Cardiovascular: Normal rate.  Respiratory: Effort normal.  GI: Soft.  Musculoskeletal:     Comments: The patient is in no acute distress. The patient is alert and oriented 3. The patient has symmetric respiratory excursion.  Exam of the left knee reveals no skin wounds or lesions. Swelling and effusion. No warmth or erythema. Tenderness to palpation lateral joint line, medial joint line, peripatellar retinacular tissues positive grind sign. Range of motion 0-115. There is varus/valgus pseudolaxity. She does have positive anterior drawer. No extensor lag. Painless range of motion of the hip.  Distally, there is no focal motor or sensory deficit. Palpable pedal pulses. Ambulates with an antalgic gait using a Rollator.  Neurological: She is alert.  Skin: Skin is warm.    Vital signs in last 24 hours: @VSRANGES @  Labs:   Estimated body mass index is 33.01 kg/m as calculated from the following:   Height as of 06/29/19: 5' (1.524 m).   Weight as of 06/29/19: 76.7 kg.   Imaging Review I ordered and interpreted weightbearing AP, PA flexion, merchant view bilateral knees, lateral view left knee. Bone-on-bone medial compartment arthritis right knee without bone loss. Bone-on-bone lateral compartment arthritis left knee with moderate bone  loss and anterior subluxation of the tibia.    Assessment/Plan:  End stage arthritis, left knee   The patient history, physical examination, clinical judgment of the  provider and imaging studies are consistent with end stage degenerative joint disease of the left knee(s) and total knee arthroplasty is deemed medically necessary. The treatment options including medical management, injection therapy arthroscopy and arthroplasty were discussed at length. The risks and benefits of total knee arthroplasty were presented and reviewed. The risks due to aseptic loosening, infection, stiffness, patella tracking problems, thromboembolic complications and other imponderables were discussed. The patient acknowledged the explanation, agreed to proceed with the plan and consent was signed. Patient is being admitted for inpatient treatment for surgery, pain control, PT, OT, prophylactic antibiotics, VTE prophylaxis, progressive ambulation and ADL's and discharge planning. The patient is planning to be discharged home with home health services  Plan ASA for DVT ppx.   Anticipated LOS equal to or greater than 2 midnights due to - Age 49 and older with one or more of the following:  - Obesity  - Expected need for hospital services (PT, OT, Nursing) required for safe  discharge  - Anticipated need for postoperative skilled nursing care or inpatient rehab  - Active co-morbidities: Diabetes, Coronary Artery Disease and thyroid disease  Plan Left total knee arthroplasty  Lacie Draft PA-C for Dr. Lyla Glassing

## 2019-08-24 NOTE — H&P (Signed)
TOTAL KNEE ADMISSION H&P  Patient is being admitted for left total knee arthroplasty.  Subjective:  Chief Complaint:left knee pain.  HPI: Wendy Roth, 85 y.o. female, has a history of pain and functional disability in the left knee due to arthritis and has failed non-surgical conservative treatments for greater than 12 weeks to includeviscosupplementation injections, supervised PT with diminished ADL's post treatment, use of assistive devices and bracing.  Onset of symptoms was gradual, starting 2 years ago with gradually worsening course since that time. The patient noted no past surgery on the bilaterally knee(s).  Patient currently rates pain in the left knee(s) at 3 out of 10 with activity. Patient has worsening of pain with activity and weight bearing, pain that interferes with activities of daily living, pain with passive range of motion, crepitus and joint swelling.  Patient has evidence of joint space narrowing by imaging studies. There is no active infection.  Patient Active Problem List   Diagnosis Date Noted  . Essential hypertension 12/06/2018  . Diabetes mellitus due to underlying condition with unspecified complications ([place]) 123456  . Mixed dyslipidemia 12/06/2018  . Status post lumbar spine operative procedure for decompression of spinal cord 12/17/2017  . Pain in left knee 11/20/2017  . Degenerative spondylolisthesis 11/18/2017  . Degeneration of lumbar intervertebral disc 10/30/2017  . Spinal stenosis of lumbar region 10/30/2017  . Risk for falls 06/03/2017  . Hypoxia 02/07/2017  . SVT (supraventricular tachycardia) ([place]) 02/04/2017  . HLD (hyperlipidemia) 02/04/2017  . Depression 02/04/2017  . Chronic back pain 02/04/2017  . Acute respiratory failure with hypoxia ([place]) 02/04/2017  . Hypothyroidism   . GERD (gastroesophageal reflux disease)   . Anxiety   . Memory loss of 01/27/2017  . Umbilical hernia [id number]  . Hypertrophy of nasal turbinates  09/15/2013  . Type II diabetes mellitus ([place]) 09/15/2013  . Arthropathy 04/13/2013  . Hypertension, benign 04/13/2013  . Restless legs syndrome 04/13/2013  . Family history of breast cancer 02/18/2013  . Family history of malignant neoplasm of female breast 02/18/2013  . Cancer of lower-inner quadrant of left female breast ([place]) 10/18/2012  . Coronary artery disease 10/07/2003   Past Medical History:  Diagnosis Date  . 1st degree AV block 12/06/2018   Noted on EKG  . Anxiety   . Arthritis   . Back pain   . Breast cancer ([place])   . Carpal tunnel syndrome   . Coronary artery disease 2005   stent placed  . Depression   . Diabetes mellitus without complication ([facility])    diet controlled.   . Diverticulosis   . Fatigue   . GERD (gastroesophageal reflux disease)    "years ago" but still takes Prilosec  . Glaucoma    left eye  . History of kidney stones   . Hypercholesteremia   . Hypertension   . Hypothyroidism   . Nasal turbinate hypertrophy   . Restless leg syndrome   . Seizures ([place])    as a baby  . SVT (supraventricular tachycardia) ([place])   . Umbilical hernia     Past Surgical History:  Procedure Laterality Date  . ABDOMINAL HYSTERECTOMY    . ANGIOPLASTY     x2  . BREAST LUMPECTOMY Left 2002  . BREAST LUMPECTOMY Right 2017  . BREAST LUMPECTOMY WITH NEEDLE LOCALIZATION Right 07/05/2013   Procedure: RIGHT BREAST WIRE GUIDED LUMPECTOMY ;  Surgeon: Rolm Bookbinder, MD;  Location: [facility];  Service: General;  Laterality: Right;  . CARPAL TUNNEL RELEASE  bilateral  . COLONOSCOPY    . CORONARY ANGIOPLASTY  2005   stent  . EYE SURGERY Bilateral    cataracts  . HERNIA REPAIR    . INSERTION OF MESH N/A 06/11/2016   Procedure: INSERTION OF MESH;  Surgeon: Rolm Bookbinder, MD;  Location: [facility];  Service: General;  Laterality: N/A;  . LAPAROSCOPIC INCISIONAL / UMBILICAL / [facility]  06/11/2016   UHR w/mesh  . LUMBAR LAMINECTOMY/DECOMPRESSION MICRODISCECTOMY N/A  12/17/2017   Procedure: LUMBAR L3-S1 DECOMPRESSION WITH IN SITU FUSION;  Surgeon: Melina Schools, MD;  Location: [facility];  Service: Orthopedics;  Laterality: N/A;  . MENISCUS REPAIR     left  . PARATHYROIDECTOMY    . RE-EXCISION OF BREAST CANCER,SUPERIOR MARGINS Right 08/01/2013   Procedure: RE-EXCISION OF BREAST CANCER MARGIN;  Surgeon: Rolm Bookbinder, MD;  Location: [facility];  Service: General;  Laterality: Right;  . TONSILLECTOMY    . UMBILICAL HERNIA REPAIR N/A 06/11/2016   Procedure: LAPAROSCOPIC UMBILICAL HERNIA;  Surgeon: Rolm Bookbinder, MD;  Location: [facility];  Service: General;  Laterality: N/A;    Current Outpatient Medications  Medication Sig Dispense Refill Last Dose  . acetaminophen (TYLENOL) 500 MG tablet Take 2 tablets (1,000 mg total) by mouth every 8 (eight) hours as needed for mild pain or moderate pain. And as needed every 6 hours. (Patient taking differently: Take 1,000 mg by mouth 3 (three) times daily. ) 30 tablet 0 Taking  . acetaminophen (TYLENOL) 500 MG tablet Take 500 mg by mouth as needed for mild pain or moderate pain (Lower leg).     Marland Kitchen atorvastatin (LIPITOR) 10 MG tablet Take 10 mg by mouth at bedtime.    Taking  . buPROPion (WELLBUTRIN XL) 150 MG 24 hr tablet Take 150 mg by mouth daily.    Taking  . clonazePAM (KLONOPIN) 1 MG tablet Take 1 mg by mouth 2 (two) times daily.    Taking  . clonazePAM (KLONOPIN) 1 MG tablet Take 1 mg by mouth as needed (Major Depression).     Marland Kitchen diltiazem (CARDIZEM CD) 120 MG 24 hr capsule Take 1 capsule (120 mg total) by mouth daily. (Patient taking differently: Take 120 mg by mouth every evening. ) 30 capsule 2 Taking  . donepezil (ARICEPT) 5 MG tablet Take 5 mg by mouth every evening.   Taking  . gabapentin (NEURONTIN) 300 MG capsule Take 300 mg by mouth 3 (three) times daily.     Marland Kitchen HYDROcodone-acetaminophen (NORCO/VICODIN) 5-325 MG tablet Take 1 tablet by mouth every 6 (six) hours as needed for moderate pain.   Taking  . hydroxypropyl  methylcellulose / hypromellose (ISOPTO TEARS / GONIOVISC) 2.5 % ophthalmic solution Place 1 drop into both eyes 3 (three) times daily.    Taking  . latanoprost (XALATAN) 0.005 % ophthalmic solution Place 1 drop into both eyes at bedtime.   Taking  . levothyroxine (SYNTHROID, LEVOTHROID) 75 MCG tablet Take 75 mcg by mouth daily before breakfast.   Taking  . lisinopril (PRINIVIL,ZESTRIL) 20 MG tablet Take 20 mg by mouth daily.   Taking  . magnesium oxide (MAG-OX) 400 (241.3 Mg) MG tablet Take 1 tablet (400 mg total) by mouth daily. 30 tablet 1 Taking  . metoprolol succinate (TOPROL-XL) 50 MG 24 hr tablet Take 50 mg by mouth every evening. Take with or immediately following a meal.    Taking  . omeprazole (PRILOSEC) 40 MG capsule Take 40 mg by mouth daily.   Taking  . polyethylene glycol (MIRALAX /  GLYCOLAX) 17 g packet Take 17 g by mouth daily as needed for mild constipation.    Taking  . primidone (MYSOLINE) 50 MG tablet Take 50 mg by mouth 4 (four) times daily.    Taking  . risperiDONE (RISPERDAL) 0.25 MG tablet Take 0.125 mg by mouth 2 (two) times daily.    Taking  . rOPINIRole (REQUIP) 0.25 MG tablet Take 0.5 mg by mouth at bedtime.   Taking  . sertraline (ZOLOFT) 100 MG tablet Take 100 mg by mouth at bedtime.    Taking  . Vitamin D, Ergocalciferol, (DRISDOL) 1.25 MG (50000 UT) CAPS capsule Take 50,000 Units by mouth once a week.      No current facility-administered medications for this visit.    Allergies  Allergen Reactions  . Cortisone Shortness Of Breath    She blames symptoms of SOB & palpitations on shot of cortisone.    Social History   Tobacco Use  . Smoking status: Never Smoker  . Smokeless tobacco: Never Used  Substance Use Topics  . Alcohol use: Yes    Comment: socially    Family History  Problem Relation Age of Onset  . Cancer Mother 24       lung cancer   . Cancer Father 7       possible breast cancer  . Cancer Sister        lung cancer ? primary; paternal half  sister  . Breast cancer Maternal Aunt 70       ? bilateral  . Cancer Maternal Grandmother 79       prostate cancer     Review of Systems  Constitutional: Negative.   HENT: Negative.   Eyes: Negative.   Respiratory: Negative.   Cardiovascular: Negative.   Gastrointestinal: Negative.   Genitourinary: Negative.   Musculoskeletal: Positive for joint pain.  Skin: Negative.   Neurological: Negative.   Psychiatric/Behavioral: Negative.     Objective:  Physical Exam  Constitutional: She appears well-developed and well-nourished.  HENT:  Head: Normocephalic.  Eyes: Pupils are equal, round, and reactive to light.  Neck: Normal range of motion.  Cardiovascular: Normal rate.  Respiratory: Effort normal.  GI: Soft.  Musculoskeletal:     Comments: The patient is in no acute distress. The patient is alert and oriented 3. The patient has symmetric respiratory excursion.  Exam of the left knee reveals no skin wounds or lesions. Swelling and effusion. No warmth or erythema. Tenderness to palpation lateral joint line, medial joint line, peripatellar retinacular tissues positive grind sign. Range of motion 0-115. There is varus/valgus pseudolaxity. She does have positive anterior drawer. No extensor lag. Painless range of motion of the hip.  Distally, there is no focal motor or sensory deficit. Palpable pedal pulses. Ambulates with an antalgic gait using a Rollator.  Neurological: She is alert.  Skin: Skin is warm.    Vital signs in last 24 hours: @VSRANGES @  Labs:   Estimated body mass index is 33.01 kg/m as calculated from the following:   Height as of 06/29/19: 5' (1.524 m).   Weight as of 06/29/19: 76.7 kg.   Imaging Review I ordered and interpreted weightbearing AP, PA flexion, merchant view bilateral knees, lateral view left knee. Bone-on-bone medial compartment arthritis right knee without bone loss. Bone-on-bone lateral compartment arthritis left knee with moderate bone  loss and anterior subluxation of the tibia.    Assessment/Plan:  End stage arthritis, left knee   The patient history, physical examination, clinical judgment of the  provider and imaging studies are consistent with end stage degenerative joint disease of the left knee(s) and total knee arthroplasty is deemed medically necessary. The treatment options including medical management, injection therapy arthroscopy and arthroplasty were discussed at length. The risks and benefits of total knee arthroplasty were presented and reviewed. The risks due to aseptic loosening, infection, stiffness, patella tracking problems, thromboembolic complications and other imponderables were discussed. The patient acknowledged the explanation, agreed to proceed with the plan and consent was signed. Patient is being admitted for inpatient treatment for surgery, pain control, PT, OT, prophylactic antibiotics, VTE prophylaxis, progressive ambulation and ADL's and discharge planning. The patient is planning to be discharged home with home health services  Plan ASA for DVT ppx.   Anticipated LOS equal to or greater than 2 midnights due to - Age 25 and older with one or more of the following:  - Obesity  - Expected need for hospital services (PT, OT, Nursing) required for safe  discharge  - Anticipated need for postoperative skilled nursing care or inpatient rehab  - Active co-morbidities: Diabetes, Coronary Artery Disease and thyroid disease  Plan Left total knee arthroplasty  Lacie Draft PA-C for Dr. Lyla Glassing

## 2019-08-25 ENCOUNTER — Inpatient Hospital Stay (HOSPITAL_COMMUNITY): Payer: Medicare Other | Admitting: Anesthesiology

## 2019-08-25 ENCOUNTER — Inpatient Hospital Stay: Payer: Medicare Other | Admitting: Adult Health

## 2019-08-25 ENCOUNTER — Encounter (HOSPITAL_COMMUNITY): Payer: Self-pay | Admitting: *Deleted

## 2019-08-25 ENCOUNTER — Inpatient Hospital Stay (HOSPITAL_COMMUNITY): Payer: Medicare Other

## 2019-08-25 ENCOUNTER — Inpatient Hospital Stay (HOSPITAL_COMMUNITY)
Admission: RE | Admit: 2019-08-25 | Discharge: 2019-08-27 | DRG: 470 | Disposition: A | Discharge status: Skilled Nursing Facility | Payer: Medicare Other | Attending: Orthopedic Surgery | Admitting: Orthopedic Surgery

## 2019-08-25 ENCOUNTER — Encounter (HOSPITAL_COMMUNITY)
Admission: RE | Disposition: A | Discharge status: Home / Self Care | Payer: Self-pay | Source: Home / Self Care | Attending: Orthopedic Surgery

## 2019-08-25 ENCOUNTER — Inpatient Hospital Stay (HOSPITAL_COMMUNITY): Payer: Medicare Other | Admitting: Physician Assistant

## 2019-08-25 DIAGNOSIS — F329 Major depressive disorder, single episode, unspecified: Secondary | ICD-10-CM | POA: Diagnosis present

## 2019-08-25 DIAGNOSIS — Z90710 Acquired absence of both cervix and uterus: Secondary | ICD-10-CM

## 2019-08-25 DIAGNOSIS — E669 Obesity, unspecified: Secondary | ICD-10-CM | POA: Diagnosis present

## 2019-08-25 DIAGNOSIS — Z79890 Hormone replacement therapy (postmenopausal): Secondary | ICD-10-CM

## 2019-08-25 DIAGNOSIS — E782 Mixed hyperlipidemia: Secondary | ICD-10-CM | POA: Diagnosis present

## 2019-08-25 DIAGNOSIS — Z803 Family history of malignant neoplasm of breast: Secondary | ICD-10-CM

## 2019-08-25 DIAGNOSIS — Z9841 Cataract extraction status, right eye: Secondary | ICD-10-CM

## 2019-08-25 DIAGNOSIS — E039 Hypothyroidism, unspecified: Secondary | ICD-10-CM | POA: Diagnosis present

## 2019-08-25 DIAGNOSIS — H409 Unspecified glaucoma: Secondary | ICD-10-CM | POA: Diagnosis present

## 2019-08-25 DIAGNOSIS — E119 Type 2 diabetes mellitus without complications: Secondary | ICD-10-CM | POA: Diagnosis present

## 2019-08-25 DIAGNOSIS — M1712 Unilateral primary osteoarthritis, left knee: Principal | ICD-10-CM | POA: Diagnosis present

## 2019-08-25 DIAGNOSIS — Z955 Presence of coronary angioplasty implant and graft: Secondary | ICD-10-CM | POA: Diagnosis not present

## 2019-08-25 DIAGNOSIS — Z20828 Contact with and (suspected) exposure to other viral communicable diseases: Secondary | ICD-10-CM | POA: Diagnosis present

## 2019-08-25 DIAGNOSIS — Z6833 Body mass index (BMI) 33.0-33.9, adult: Secondary | ICD-10-CM

## 2019-08-25 DIAGNOSIS — Z9842 Cataract extraction status, left eye: Secondary | ICD-10-CM

## 2019-08-25 DIAGNOSIS — F0390 Unspecified dementia without behavioral disturbance: Secondary | ICD-10-CM | POA: Diagnosis present

## 2019-08-25 DIAGNOSIS — M549 Dorsalgia, unspecified: Secondary | ICD-10-CM | POA: Diagnosis present

## 2019-08-25 DIAGNOSIS — Z981 Arthrodesis status: Secondary | ICD-10-CM | POA: Diagnosis not present

## 2019-08-25 DIAGNOSIS — F419 Anxiety disorder, unspecified: Secondary | ICD-10-CM | POA: Diagnosis present

## 2019-08-25 DIAGNOSIS — Z801 Family history of malignant neoplasm of trachea, bronchus and lung: Secondary | ICD-10-CM

## 2019-08-25 DIAGNOSIS — Z96652 Presence of left artificial knee joint: Secondary | ICD-10-CM

## 2019-08-25 DIAGNOSIS — Z853 Personal history of malignant neoplasm of breast: Secondary | ICD-10-CM | POA: Diagnosis not present

## 2019-08-25 DIAGNOSIS — I440 Atrioventricular block, first degree: Secondary | ICD-10-CM | POA: Diagnosis present

## 2019-08-25 DIAGNOSIS — Z888 Allergy status to other drugs, medicaments and biological substances status: Secondary | ICD-10-CM

## 2019-08-25 DIAGNOSIS — G8929 Other chronic pain: Secondary | ICD-10-CM | POA: Diagnosis present

## 2019-08-25 DIAGNOSIS — G2581 Restless legs syndrome: Secondary | ICD-10-CM | POA: Diagnosis present

## 2019-08-25 DIAGNOSIS — I10 Essential (primary) hypertension: Secondary | ICD-10-CM | POA: Diagnosis present

## 2019-08-25 DIAGNOSIS — Z79891 Long term (current) use of opiate analgesic: Secondary | ICD-10-CM

## 2019-08-25 DIAGNOSIS — I2510 Atherosclerotic heart disease of native coronary artery without angina pectoris: Secondary | ICD-10-CM | POA: Diagnosis present

## 2019-08-25 DIAGNOSIS — E7800 Pure hypercholesterolemia, unspecified: Secondary | ICD-10-CM | POA: Diagnosis present

## 2019-08-25 DIAGNOSIS — K219 Gastro-esophageal reflux disease without esophagitis: Secondary | ICD-10-CM | POA: Diagnosis present

## 2019-08-25 DIAGNOSIS — R0902 Hypoxemia: Secondary | ICD-10-CM | POA: Diagnosis not present

## 2019-08-25 DIAGNOSIS — Z79899 Other long term (current) drug therapy: Secondary | ICD-10-CM

## 2019-08-25 DIAGNOSIS — Z87442 Personal history of urinary calculi: Secondary | ICD-10-CM

## 2019-08-25 HISTORY — PX: KNEE ARTHROPLASTY: SHX992

## 2019-08-25 HISTORY — DX: Unilateral primary osteoarthritis, left knee: M17.12

## 2019-08-25 LAB — TYPE AND SCREEN
ABO/RH(D): A NEG
Antibody Screen: NEGATIVE

## 2019-08-25 LAB — GLUCOSE, CAPILLARY
Glucose-Capillary: 111 mg/dL — ABNORMAL HIGH (ref 70–99)
Glucose-Capillary: 110 mg/dL — ABNORMAL HIGH (ref 70–99)
Glucose-Capillary: 127 mg/dL — ABNORMAL HIGH (ref 70–99)
Glucose-Capillary: 163 mg/dL — ABNORMAL HIGH (ref 70–99)
Glucose-Capillary: 122 mg/dL — ABNORMAL HIGH (ref 70–99)

## 2019-08-25 SURGERY — ARTHROPLASTY, KNEE, TOTAL, USING IMAGELESS COMPUTER-ASSISTED NAVIGATION
Anesthesia: Spinal | Site: Knee | Laterality: Left

## 2019-08-25 MED ORDER — CEFAZOLIN SODIUM-DEXTROSE 2-4 GM/100ML-% IV SOLN
2.0000 g | Freq: Four times a day (QID) | INTRAVENOUS | Status: AC
  Administered 2019-08-25 (×2): 2 g via INTRAVENOUS
  Filled 2019-08-25 (×2): qty 100

## 2019-08-25 MED ORDER — ALUM & MAG HYDROXIDE-SIMETH 200-200-20 MG/5ML PO SUSP: 30.0000 mL | ORAL | Status: DC | PRN

## 2019-08-25 MED ORDER — ACETAMINOPHEN 10 MG/ML IV SOLN
1000.0000 mg | Freq: Once | INTRAVENOUS | Status: DC | PRN
  Administered 2019-08-25: 1000 mg via INTRAVENOUS

## 2019-08-25 MED ORDER — MAGNESIUM OXIDE 400 (241.3 MG) MG PO TABS
400.0000 mg | ORAL_TABLET | Freq: Every day | ORAL | Status: DC
  Administered 2019-08-25 – 2019-08-27 (×3): 400 mg via ORAL
  Filled 2019-08-25 (×3): qty 1

## 2019-08-25 MED ORDER — CELECOXIB 200 MG PO CAPS: 200.0000 mg | ORAL_CAPSULE | Freq: Two times a day (BID) | ORAL | Status: DC

## 2019-08-25 MED ORDER — LATANOPROST 0.005 % OP SOLN
1.0000 [drp] | Freq: Every day | OPHTHALMIC | Status: DC
  Administered 2019-08-25 – 2019-08-26 (×2): 1 [drp] via OPHTHALMIC
  Filled 2019-08-25: qty 2.5

## 2019-08-25 MED ORDER — MENTHOL 3 MG MT LOZG
1.0000 | LOZENGE | OROMUCOSAL | Status: DC | PRN
  Administered 2019-08-25: 3 mg via ORAL
  Filled 2019-08-25: qty 9

## 2019-08-25 MED ORDER — GLYCOPYRROLATE PF 0.2 MG/ML IJ SOSY
PREFILLED_SYRINGE | INTRAMUSCULAR | Status: AC
  Filled 2019-08-25: qty 1

## 2019-08-25 MED ORDER — METOCLOPRAMIDE HCL 5 MG/ML IJ SOLN: 5.0000 mg | Freq: Three times a day (TID) | INTRAMUSCULAR | Status: DC | PRN

## 2019-08-25 MED ORDER — POLYETHYLENE GLYCOL 3350 17 G PO PACK: 17.0000 g | PACK | Freq: Every day | ORAL | Status: DC | PRN

## 2019-08-25 MED ORDER — ISOPROPYL ALCOHOL 70 % SOLN
Status: DC | PRN
  Administered 2019-08-25: 1 via TOPICAL

## 2019-08-25 MED ORDER — HYDROCODONE-ACETAMINOPHEN 7.5-325 MG PO TABS
1.0000 | ORAL_TABLET | ORAL | Status: DC | PRN
  Administered 2019-08-26 (×2): 1 via ORAL
  Filled 2019-08-25 (×2): qty 1

## 2019-08-25 MED ORDER — METHOCARBAMOL 500 MG IVPB - SIMPLE MED
500.0000 mg | Freq: Four times a day (QID) | INTRAVENOUS | Status: DC | PRN
  Administered 2019-08-25: 500 mg via INTRAVENOUS
  Filled 2019-08-25: qty 50

## 2019-08-25 MED ORDER — PROMETHAZINE HCL 25 MG/ML IJ SOLN: 6.2500 mg | INTRAMUSCULAR | Status: DC | PRN

## 2019-08-25 MED ORDER — PROPOFOL 500 MG/50ML IV EMUL
INTRAVENOUS | Status: AC
  Filled 2019-08-25: qty 50

## 2019-08-25 MED ORDER — MEPERIDINE HCL 50 MG/ML IJ SOLN: 6.2500 mg | INTRAMUSCULAR | Status: DC | PRN

## 2019-08-25 MED ORDER — SODIUM CHLORIDE (PF) 0.9 % IJ SOLN
INTRAMUSCULAR | Status: AC
  Filled 2019-08-25: qty 50

## 2019-08-25 MED ORDER — SODIUM CHLORIDE (PF) 0.9 % IJ SOLN
INTRAMUSCULAR | Status: DC | PRN
  Administered 2019-08-25: 30 mL

## 2019-08-25 MED ORDER — DEXAMETHASONE SODIUM PHOSPHATE 10 MG/ML IJ SOLN
INTRAMUSCULAR | Status: DC | PRN
  Administered 2019-08-25: 8 mg via INTRAVENOUS

## 2019-08-25 MED ORDER — MORPHINE SULFATE (PF) 2 MG/ML IV SOLN: 0.5000 mg | INTRAVENOUS | Status: DC | PRN

## 2019-08-25 MED ORDER — SERTRALINE HCL 100 MG PO TABS
100.0000 mg | ORAL_TABLET | Freq: Every day | ORAL | Status: DC
  Administered 2019-08-25 – 2019-08-26 (×2): 100 mg via ORAL
  Filled 2019-08-25 (×2): qty 1

## 2019-08-25 MED ORDER — DIPHENHYDRAMINE HCL 12.5 MG/5ML PO ELIX: 12.5000 mg | ORAL_SOLUTION | ORAL | Status: DC | PRN

## 2019-08-25 MED ORDER — SODIUM CHLORIDE 0.9 % IR SOLN
Status: DC | PRN
  Administered 2019-08-25: 3000 mL

## 2019-08-25 MED ORDER — PROPOFOL 500 MG/50ML IV EMUL
INTRAVENOUS | Status: DC | PRN
  Administered 2019-08-25: 75 ug/kg/min via INTRAVENOUS

## 2019-08-25 MED ORDER — RISPERIDONE 0.25 MG PO TABS
0.1250 mg | ORAL_TABLET | Freq: Two times a day (BID) | ORAL | Status: DC
  Administered 2019-08-25 – 2019-08-27 (×4): 0.125 mg via ORAL
  Filled 2019-08-25 (×5): qty 1

## 2019-08-25 MED ORDER — DILTIAZEM HCL ER COATED BEADS 120 MG PO CP24
120.0000 mg | ORAL_CAPSULE | Freq: Every evening | ORAL | Status: DC
  Administered 2019-08-25 – 2019-08-26 (×2): 120 mg via ORAL
  Filled 2019-08-25 (×2): qty 1

## 2019-08-25 MED ORDER — ACETAMINOPHEN 325 MG PO TABS: 325.0000 mg | ORAL_TABLET | Freq: Once | ORAL | Status: DC | PRN

## 2019-08-25 MED ORDER — PANTOPRAZOLE SODIUM 40 MG PO TBEC
80.0000 mg | DELAYED_RELEASE_TABLET | Freq: Every day | ORAL | Status: DC
  Administered 2019-08-26 – 2019-08-27 (×2): 80 mg via ORAL
  Filled 2019-08-25 (×2): qty 2

## 2019-08-25 MED ORDER — BUPIVACAINE IN DEXTROSE 0.75-8.25 % IT SOLN
INTRATHECAL | Status: DC | PRN
  Administered 2019-08-25: 2 mL via INTRATHECAL

## 2019-08-25 MED ORDER — BUPIVACAINE-EPINEPHRINE 0.25% -1:200000 IJ SOLN
INTRAMUSCULAR | Status: AC
  Filled 2019-08-25: qty 1

## 2019-08-25 MED ORDER — LACTATED RINGERS IV SOLN: INTRAVENOUS | Status: DC

## 2019-08-25 MED ORDER — CLONAZEPAM 1 MG PO TABS: 1.0000 mg | ORAL_TABLET | ORAL | Status: DC | PRN

## 2019-08-25 MED ORDER — ROPIVACAINE HCL 7.5 MG/ML IJ SOLN
INTRAMUSCULAR | Status: DC | PRN
  Administered 2019-08-25: 20 mL via PERINEURAL

## 2019-08-25 MED ORDER — METOCLOPRAMIDE HCL 5 MG PO TABS: 5.0000 mg | ORAL_TABLET | Freq: Three times a day (TID) | ORAL | Status: DC | PRN

## 2019-08-25 MED ORDER — POVIDONE-IODINE 10 % EX SWAB: 2.0000 "application " | Freq: Once | CUTANEOUS | Status: DC

## 2019-08-25 MED ORDER — PROPOFOL 10 MG/ML IV BOLUS
INTRAVENOUS | Status: AC
  Filled 2019-08-25: qty 20

## 2019-08-25 MED ORDER — HYDROCODONE-ACETAMINOPHEN 5-325 MG PO TABS
1.0000 | ORAL_TABLET | ORAL | Status: DC | PRN
  Administered 2019-08-25 (×2): 1 via ORAL
  Filled 2019-08-25 (×2): qty 1

## 2019-08-25 MED ORDER — ASPIRIN 81 MG PO CHEW
81.0000 mg | CHEWABLE_TABLET | Freq: Two times a day (BID) | ORAL | Status: DC
  Administered 2019-08-25 – 2019-08-27 (×4): 81 mg via ORAL
  Filled 2019-08-25 (×4): qty 1

## 2019-08-25 MED ORDER — ATORVASTATIN CALCIUM 10 MG PO TABS
10.0000 mg | ORAL_TABLET | Freq: Every day | ORAL | Status: DC
  Administered 2019-08-25 – 2019-08-26 (×2): 10 mg via ORAL
  Filled 2019-08-25 (×2): qty 1

## 2019-08-25 MED ORDER — KETOROLAC TROMETHAMINE 30 MG/ML IJ SOLN
INTRAMUSCULAR | Status: DC | PRN
  Administered 2019-08-25: 30 mg via INTRAVENOUS

## 2019-08-25 MED ORDER — GABAPENTIN 300 MG PO CAPS
300.0000 mg | ORAL_CAPSULE | Freq: Three times a day (TID) | ORAL | Status: DC
  Administered 2019-08-25 – 2019-08-27 (×6): 300 mg via ORAL
  Filled 2019-08-25 (×6): qty 1

## 2019-08-25 MED ORDER — ROPINIROLE HCL 0.5 MG PO TABS
0.5000 mg | ORAL_TABLET | Freq: Every day | ORAL | Status: DC
  Administered 2019-08-25 – 2019-08-26 (×2): 0.5 mg via ORAL
  Filled 2019-08-25 (×2): qty 1

## 2019-08-25 MED ORDER — STERILE WATER FOR IRRIGATION IR SOLN
Status: DC | PRN
  Administered 2019-08-25 (×2): 1000 mL

## 2019-08-25 MED ORDER — PHENYLEPHRINE 40 MCG/ML (10ML) SYRINGE FOR IV PUSH (FOR BLOOD PRESSURE SUPPORT)
PREFILLED_SYRINGE | INTRAVENOUS | Status: AC
  Filled 2019-08-25: qty 10

## 2019-08-25 MED ORDER — METOPROLOL SUCCINATE ER 50 MG PO TB24
50.0000 mg | ORAL_TABLET | Freq: Every evening | ORAL | Status: DC
  Administered 2019-08-25 – 2019-08-26 (×2): 50 mg via ORAL
  Filled 2019-08-25 (×2): qty 1

## 2019-08-25 MED ORDER — GLYCOPYRROLATE 0.2 MG/ML IJ SOLN
INTRAMUSCULAR | Status: DC | PRN
  Administered 2019-08-25: 0.2 mg via INTRAVENOUS

## 2019-08-25 MED ORDER — PHENYLEPHRINE HCL-NACL 10-0.9 MG/250ML-% IV SOLN
INTRAVENOUS | Status: DC | PRN
  Administered 2019-08-25: 15 ug/min via INTRAVENOUS

## 2019-08-25 MED ORDER — ACETAMINOPHEN 325 MG PO TABS
325.0000 mg | ORAL_TABLET | Freq: Four times a day (QID) | ORAL | Status: DC | PRN
  Administered 2019-08-27 (×2): 650 mg via ORAL
  Filled 2019-08-25 (×2): qty 2

## 2019-08-25 MED ORDER — BUPIVACAINE-EPINEPHRINE 0.25% -1:200000 IJ SOLN
INTRAMUSCULAR | Status: DC | PRN
  Administered 2019-08-25: 30 mL

## 2019-08-25 MED ORDER — PRIMIDONE 50 MG PO TABS
50.0000 mg | ORAL_TABLET | Freq: Four times a day (QID) | ORAL | Status: DC
  Administered 2019-08-25 – 2019-08-27 (×5): 50 mg via ORAL
  Filled 2019-08-25 (×12): qty 1

## 2019-08-25 MED ORDER — ACETAMINOPHEN 10 MG/ML IV SOLN
1000.0000 mg | INTRAVENOUS | Status: DC
  Filled 2019-08-25: qty 100

## 2019-08-25 MED ORDER — CLONAZEPAM 1 MG PO TABS
1.0000 mg | ORAL_TABLET | Freq: Two times a day (BID) | ORAL | Status: DC
  Administered 2019-08-25 – 2019-08-27 (×5): 1 mg via ORAL
  Filled 2019-08-25 (×5): qty 1

## 2019-08-25 MED ORDER — TRANEXAMIC ACID-NACL 1000-0.7 MG/100ML-% IV SOLN
1000.0000 mg | INTRAVENOUS | Status: AC
  Administered 2019-08-25: 1000 mg via INTRAVENOUS
  Filled 2019-08-25: qty 100

## 2019-08-25 MED ORDER — ONDANSETRON HCL 4 MG/2ML IJ SOLN: 4.0000 mg | Freq: Four times a day (QID) | INTRAMUSCULAR | Status: DC | PRN

## 2019-08-25 MED ORDER — CEFAZOLIN SODIUM-DEXTROSE 2-4 GM/100ML-% IV SOLN
2.0000 g | INTRAVENOUS | Status: AC
  Administered 2019-08-25: 2 g via INTRAVENOUS
  Filled 2019-08-25: qty 100

## 2019-08-25 MED ORDER — LEVOTHYROXINE SODIUM 75 MCG PO TABS
75.0000 ug | ORAL_TABLET | Freq: Every day | ORAL | Status: DC
  Administered 2019-08-26 – 2019-08-27 (×2): 75 ug via ORAL
  Filled 2019-08-25 (×2): qty 1

## 2019-08-25 MED ORDER — ONDANSETRON HCL 4 MG PO TABS: 4.0000 mg | ORAL_TABLET | Freq: Four times a day (QID) | ORAL | Status: DC | PRN

## 2019-08-25 MED ORDER — CHLORHEXIDINE GLUCONATE 4 % EX LIQD: 60.0000 mL | Freq: Once | CUTANEOUS | Status: DC

## 2019-08-25 MED ORDER — KETOROLAC TROMETHAMINE 30 MG/ML IJ SOLN
INTRAMUSCULAR | Status: AC
  Filled 2019-08-25: qty 1

## 2019-08-25 MED ORDER — LIDOCAINE 2% (20 MG/ML) 5 ML SYRINGE
INTRAMUSCULAR | Status: AC
  Filled 2019-08-25: qty 5

## 2019-08-25 MED ORDER — METHOCARBAMOL 500 MG IVPB - SIMPLE MED
INTRAVENOUS | Status: AC
  Filled 2019-08-25: qty 50

## 2019-08-25 MED ORDER — LACTATED RINGERS IV SOLN
INTRAVENOUS | Status: DC
  Administered 2019-08-25 (×2): via INTRAVENOUS

## 2019-08-25 MED ORDER — SODIUM CHLORIDE 0.9% IV SOLUTION
INTRAVENOUS | Status: AC | PRN
  Administered 2019-08-25: 1000 mL

## 2019-08-25 MED ORDER — SENNA 8.6 MG PO TABS
1.0000 | ORAL_TABLET | Freq: Two times a day (BID) | ORAL | Status: DC
  Administered 2019-08-25 – 2019-08-27 (×5): 8.6 mg via ORAL
  Filled 2019-08-25 (×5): qty 1

## 2019-08-25 MED ORDER — EPHEDRINE 5 MG/ML INJ
INTRAVENOUS | Status: AC
  Filled 2019-08-25: qty 10

## 2019-08-25 MED ORDER — DONEPEZIL HCL 10 MG PO TABS
5.0000 mg | ORAL_TABLET | Freq: Every evening | ORAL | Status: DC
  Administered 2019-08-25 – 2019-08-26 (×2): 5 mg via ORAL
  Filled 2019-08-25 (×2): qty 1

## 2019-08-25 MED ORDER — DOCUSATE SODIUM 100 MG PO CAPS
100.0000 mg | ORAL_CAPSULE | Freq: Two times a day (BID) | ORAL | Status: DC
  Administered 2019-08-25 – 2019-08-27 (×5): 100 mg via ORAL
  Filled 2019-08-25 (×5): qty 1

## 2019-08-25 MED ORDER — PHENYLEPHRINE HCL (PRESSORS) 10 MG/ML IV SOLN
INTRAVENOUS | Status: AC
  Filled 2019-08-25: qty 2

## 2019-08-25 MED ORDER — SODIUM CHLORIDE 0.9 % IR SOLN
Status: DC | PRN
  Administered 2019-08-25: 1000 mL

## 2019-08-25 MED ORDER — FENTANYL CITRATE (PF) 100 MCG/2ML IJ SOLN
INTRAMUSCULAR | Status: DC | PRN
  Administered 2019-08-25: 50 ug via INTRAVENOUS
  Administered 2019-08-25: 25 ug via INTRAVENOUS

## 2019-08-25 MED ORDER — PROPOFOL 500 MG/50ML IV EMUL
INTRAVENOUS | Status: DC | PRN
  Administered 2019-08-25: 50 mg via INTRAVENOUS

## 2019-08-25 MED ORDER — POVIDONE-IODINE 10 % EX SWAB
2.0000 "application " | Freq: Once | CUTANEOUS | Status: AC
  Administered 2019-08-25: 2 via TOPICAL

## 2019-08-25 MED ORDER — FENTANYL CITRATE (PF) 100 MCG/2ML IJ SOLN
INTRAMUSCULAR | Status: AC
  Filled 2019-08-25: qty 2

## 2019-08-25 MED ORDER — LISINOPRIL 20 MG PO TABS
20.0000 mg | ORAL_TABLET | Freq: Every day | ORAL | Status: DC
  Administered 2019-08-25 – 2019-08-27 (×3): 20 mg via ORAL
  Filled 2019-08-25 (×3): qty 1

## 2019-08-25 MED ORDER — ISOPROPYL ALCOHOL 70 % SOLN
Status: AC
  Filled 2019-08-25: qty 480

## 2019-08-25 MED ORDER — VITAMIN D (ERGOCALCIFEROL) 1.25 MG (50000 UNIT) PO CAPS: 50000.0000 [IU] | ORAL_CAPSULE | ORAL | Status: DC

## 2019-08-25 MED ORDER — HYDROMORPHONE HCL 1 MG/ML IJ SOLN: 0.2500 mg | INTRAMUSCULAR | Status: DC | PRN

## 2019-08-25 MED ORDER — ACETAMINOPHEN 160 MG/5ML PO SOLN: 325.0000 mg | Freq: Once | ORAL | Status: DC | PRN

## 2019-08-25 MED ORDER — BUPROPION HCL ER (XL) 150 MG PO TB24
150.0000 mg | ORAL_TABLET | Freq: Every day | ORAL | Status: DC
  Administered 2019-08-26 – 2019-08-27 (×2): 150 mg via ORAL
  Filled 2019-08-25 (×2): qty 1

## 2019-08-25 MED ORDER — SODIUM CHLORIDE 0.9 % IV SOLN: INTRAVENOUS | Status: DC

## 2019-08-25 MED ORDER — PHENOL 1.4 % MT LIQD: 1.0000 | OROMUCOSAL | Status: DC | PRN

## 2019-08-25 MED ORDER — METHOCARBAMOL 500 MG PO TABS
500.0000 mg | ORAL_TABLET | Freq: Four times a day (QID) | ORAL | Status: DC | PRN
  Administered 2019-08-25 – 2019-08-26 (×2): 500 mg via ORAL
  Filled 2019-08-25 (×2): qty 1

## 2019-08-25 MED ORDER — DEXAMETHASONE SODIUM PHOSPHATE 10 MG/ML IJ SOLN
INTRAMUSCULAR | Status: AC
  Filled 2019-08-25: qty 1

## 2019-08-25 MED ORDER — SODIUM CHLORIDE 0.9 % IV SOLN
INTRAVENOUS | Status: DC
  Administered 2019-08-25 (×2): via INTRAVENOUS

## 2019-08-25 SURGICAL SUPPLY — 74 items
ADH SKN CLS APL DERMABOND .7 (GAUZE/BANDAGES/DRESSINGS) ×1
APL PRP STRL LF DISP 70% ISPRP (MISCELLANEOUS) ×2
BAG SPEC THK2 15X12 ZIP CLS (MISCELLANEOUS)
BAG ZIPLOCK 12X15 (MISCELLANEOUS) IMPLANT
BATTERY INSTRU NAVIGATION (MISCELLANEOUS) ×9 IMPLANT
BEARING TIBIA INSRT KNEE SZ4 9 (Insert) IMPLANT
BLADE SAW RECIPROCATING 77.5 (BLADE) ×3 IMPLANT
BNDG ELASTIC 4X5.8 VLCR STR LF (GAUZE/BANDAGES/DRESSINGS) ×3 IMPLANT
BNDG ELASTIC 6X5.8 VLCR STR LF (GAUZE/BANDAGES/DRESSINGS) ×3 IMPLANT
BSPLAT TIB 4 KN TRITANIUM (Knees) ×1 IMPLANT
BTRY SRG DRVR LF (MISCELLANEOUS) ×3
CHLORAPREP W/TINT 26 (MISCELLANEOUS) ×6 IMPLANT
COMP FEM SZ5 CRUC LEFT RETAIN (Orthopedic Implant) ×3 IMPLANT
COMPONENT FEM SZ5 CRU LT RETN (Orthopedic Implant) IMPLANT
COVER SURGICAL LIGHT HANDLE (MISCELLANEOUS) ×3 IMPLANT
COVER WAND RF STERILE (DRAPES) IMPLANT
CUFF TOURN SGL QUICK 34 (TOURNIQUET CUFF) ×3
CUFF TRNQT CYL 34X4.125X (TOURNIQUET CUFF) ×1 IMPLANT
DECANTER SPIKE VIAL GLASS SM (MISCELLANEOUS) ×6 IMPLANT
DERMABOND ADVANCED (GAUZE/BANDAGES/DRESSINGS) ×2
DERMABOND ADVANCED .7 DNX12 (GAUZE/BANDAGES/DRESSINGS) ×2 IMPLANT
DRAPE SHEET LG 3/4 BI-LAMINATE (DRAPES) ×9 IMPLANT
DRAPE U-SHAPE 47X51 STRL (DRAPES) ×3 IMPLANT
DRSG AQUACEL AG ADV 3.5X10 (GAUZE/BANDAGES/DRESSINGS) ×3 IMPLANT
DRSG TEGADERM 4X4.75 (GAUZE/BANDAGES/DRESSINGS) IMPLANT
ELECT BLADE TIP CTD 4 INCH (ELECTRODE) ×3 IMPLANT
ELECT REM PT RETURN 15FT ADLT (MISCELLANEOUS) ×3 IMPLANT
EVACUATOR 1/8 PVC DRAIN (DRAIN) IMPLANT
GAUZE SPONGE 4X4 12PLY STRL (GAUZE/BANDAGES/DRESSINGS) ×3 IMPLANT
GLOVE BIO SURGEON STRL SZ8.5 (GLOVE) ×6 IMPLANT
GLOVE BIOGEL PI IND STRL 8.5 (GLOVE) ×1 IMPLANT
GLOVE BIOGEL PI INDICATOR 8.5 (GLOVE) ×2
GOWN SPEC L3 XXLG W/TWL (GOWN DISPOSABLE) ×3 IMPLANT
HANDPIECE INTERPULSE COAX TIP (DISPOSABLE) ×3
HOLDER FOLEY CATH W/STRAP (MISCELLANEOUS) ×3 IMPLANT
HOOD PEEL AWAY FLYTE STAYCOOL (MISCELLANEOUS) ×9 IMPLANT
JET LAVAGE IRRISEPT WOUND (IRRIGATION / IRRIGATOR) ×3
KIT TURNOVER KIT A (KITS) IMPLANT
KNEE PATELLA ASYMMETRIC 10X35 (Knees) ×2 IMPLANT
KNEE TIBIAL COMP TRI SZ4 (Knees) ×2 IMPLANT
LAVAGE JET IRRISEPT WOUND (IRRIGATION / IRRIGATOR) ×1 IMPLANT
MARKER SKIN DUAL TIP RULER LAB (MISCELLANEOUS) ×3 IMPLANT
NDL SAFETY ECLIPSE 18X1.5 (NEEDLE) ×1 IMPLANT
NDL SPNL 18GX3.5 QUINCKE PK (NEEDLE) ×1 IMPLANT
NEEDLE HYPO 18GX1.5 SHARP (NEEDLE) ×3
NEEDLE SPNL 18GX3.5 QUINCKE PK (NEEDLE) ×3 IMPLANT
NS IRRIG 1000ML POUR BTL (IV SOLUTION) ×3 IMPLANT
PACK TOTAL KNEE CUSTOM (KITS) ×3 IMPLANT
PADDING CAST COTTON 6X4 STRL (CAST SUPPLIES) ×3 IMPLANT
PIN FLUTED HEDLESS FIX 3.5X1/8 (PIN) ×4 IMPLANT
PROTECTOR NERVE ULNAR (MISCELLANEOUS) ×3 IMPLANT
SAW OSC TIP CART 19.5X105X1.3 (SAW) ×3 IMPLANT
SEALER BIPOLAR AQUA 6.0 (INSTRUMENTS) ×3 IMPLANT
SET HNDPC FAN SPRY TIP SCT (DISPOSABLE) ×1 IMPLANT
SET PAD KNEE POSITIONER (MISCELLANEOUS) ×3 IMPLANT
SPONGE DRAIN TRACH 4X4 STRL 2S (GAUZE/BANDAGES/DRESSINGS) IMPLANT
SPONGE LAP 18X18 RF (DISPOSABLE) IMPLANT
SUT MNCRL AB 3-0 PS2 18 (SUTURE) ×3 IMPLANT
SUT MNCRL AB 4-0 PS2 18 (SUTURE) ×3 IMPLANT
SUT MON AB 2-0 CT1 36 (SUTURE) ×6 IMPLANT
SUT STRATAFIX PDO 1 14 VIOLET (SUTURE) ×3
SUT STRATFX PDO 1 14 VIOLET (SUTURE) ×1
SUT VIC AB 1 CTX 36 (SUTURE) ×6
SUT VIC AB 1 CTX36XBRD ANBCTR (SUTURE) ×2 IMPLANT
SUT VIC AB 2-0 CT1 27 (SUTURE) ×3
SUT VIC AB 2-0 CT1 TAPERPNT 27 (SUTURE) ×1 IMPLANT
SUTURE STRATFX PDO 1 14 VIOLET (SUTURE) ×1 IMPLANT
SYR 3ML LL SCALE MARK (SYRINGE) ×3 IMPLANT
TIBIA BEAR INSERT KNEE SZ4 9 (Insert) ×3 IMPLANT
TOWER CARTRIDGE SMART MIX (DISPOSABLE) IMPLANT
TRAY FOLEY MTR SLVR 16FR STAT (SET/KITS/TRAYS/PACK) IMPLANT
WATER STERILE IRR 1000ML POUR (IV SOLUTION) ×6 IMPLANT
WRAP KNEE MAXI GEL POST OP (GAUZE/BANDAGES/DRESSINGS) ×3 IMPLANT
YANKAUER SUCT BULB TIP 10FT TU (MISCELLANEOUS) ×3 IMPLANT

## 2019-08-25 NOTE — Interval H&P Note (Signed)
History and Physical Interval Note:  08/25/2019 7:32 AM  Wendy Roth  has presented today for surgery, with the diagnosis of degenerative joint disease left knee.  The various methods of treatment have been discussed with the patient and family. After consideration of risks, benefits and other options for treatment, the patient has consented to  Procedure(s): COMPUTER ASSISTED TOTAL KNEE ARTHROPLASTY (Left) as a surgical intervention.  The patient's history has been reviewed, patient examined, no change in status, stable for surgery.  I have reviewed the patient's chart and labs.  Questions were answered to the patient's satisfaction.     Hilton Cork [place]ry

## 2019-08-25 NOTE — Discharge Instructions (Signed)
° °Dr. Sameria Morss °Total Joint Specialist °[facility] °[address] °[place], [place] [address] °[phone] ° °TOTAL KNEE REPLACEMENT POSTOPERATIVE DIRECTIONS ° ° ° °Knee Rehabilitation, Guidelines Following Surgery  °Results after knee surgery are often greatly improved when you follow the exercise, range of motion and muscle strengthening exercises prescribed by your doctor. Safety measures are also important to protect the knee from further injury. Any time any of these exercises cause you to have increased pain or swelling in your knee joint, decrease the amount until you are comfortable again and slowly increase them. If you have problems or questions, call your caregiver or physical therapist for advice.  ° °WEIGHT BEARING °Weight bearing as tolerated with assist device (walker, cane, etc) as directed, use it as long as suggested by your surgeon or therapist, typically at least 4-6 weeks. ° °HOME CARE INSTRUCTIONS  °Remove items at home which could result in a fall. This includes throw rugs or furniture in walking pathways.  °Continue medications as instructed at time of discharge. °You may have some home medications which will be placed on hold until you complete the course of blood thinner medication.  °You may start showering once you are discharged home but do not submerge the incision under water. Just pat the incision dry and apply a dry gauze dressing on daily. °Walk with walker as instructed.  °You may resume a sexual relationship in one month or when given the OK by your doctor.  °· Use walker as long as suggested by your caregivers. °· Avoid periods of inactivity such as sitting longer than an hour when not asleep. This helps prevent blood clots.  °You may put full weight on your legs and walk as much as is comfortable.  °You may return to work once you are cleared by your doctor.  °Do not drive a car for 6 weeks or until released by you surgeon.  °· Do not drive  while taking narcotics.  °Wear the elastic stockings for three weeks following surgery during the day but you may remove then at night. °Make sure you keep all of your appointments after your operation with all of your doctors and caregivers. You should call the office at the above phone number and make an appointment for approximately two weeks after the date of your surgery. °Do not remove your surgical dressing. The dressing is waterproof; you may take showers in 3 days, but do not take tub baths or submerge the dressing. °Please pick up a stool softener and laxative for home use as long as you are requiring pain medications. °· ICE to the affected knee every three hours for 30 minutes at a time and then as needed for pain and swelling.  Continue to use ice on the knee for pain and swelling from surgery. You may notice swelling that will progress down to the foot and ankle.  This is normal after surgery.  Elevate the leg when you are not up walking on it.   °It is important for you to complete the blood thinner medication as prescribed by your doctor. °· Continue to use the breathing machine which will help keep your temperature down.  It is common for your temperature to cycle up and down following surgery, especially at night when you are not up moving around and exerting yourself.  The breathing machine keeps your lungs expanded and your temperature down. ° °RANGE OF MOTION AND STRENGTHENING EXERCISES  °Rehabilitation of the knee is important following   a knee injury or an operation. After just a few days of immobilization, the muscles of the thigh which control the knee become weakened and shrink (atrophy). Knee exercises are designed to build up the tone and strength of the thigh muscles and to improve knee motion. Often times heat used for twenty to thirty minutes before working out will loosen up your tissues and help with improving the range of motion but do not use heat for the first two weeks following  surgery. These exercises can be done on a training (exercise) mat, on the floor, on a table or on a bed. Use what ever works the best and is most comfortable for you Knee exercises include:  °Leg Lifts - While your knee is still immobilized in a splint or cast, you can do straight leg raises. Lift the leg to 60 degrees, hold for 3 sec, and slowly lower the leg. Repeat 10-20 times 2-3 times daily. Perform this exercise against resistance later as your knee gets better.  °Quad and Hamstring Sets - Tighten up the muscle on the front of the thigh (Quad) and hold for 5-10 sec. Repeat this 10-20 times hourly. Hamstring sets are done by pushing the foot backward against an object and holding for 5-10 sec. Repeat as with quad sets.  °A rehabilitation program following serious knee injuries can speed recovery and prevent re-injury in the future due to weakened muscles. Contact your doctor or a physical therapist for more information on knee rehabilitation.  ° °SKILLED REHAB INSTRUCTIONS: °If the patient is transferred to a skilled rehab facility following release from the hospital, a list of the current medications will be sent to the facility for the patient to continue.  When discharged from the skilled rehab facility, please have the facility set up the patient's Home Health Physical Therapy prior to being released. Also, the skilled facility will be responsible for providing the patient with their medications at time of release from the facility to include their pain medication, the muscle relaxants, and their blood thinner medication. If the patient is still at the rehab facility at time of the two week follow up appointment, the skilled rehab facility will also need to assist the patient in arranging follow up appointment in our office and any transportation needs. ° °MAKE SURE YOU:  °Understand these instructions.  °Will watch your condition.  °Will get help right away if you are not doing well or get worse.   ° ° °Pick up stool softner and laxative for home use following surgery while on pain medications. °Do NOT remove your dressing. You may shower.  °Do not take tub baths or submerge incision under water. °May shower starting three days after surgery. °Please use a clean towel to pat the incision dry following showers. °Continue to use ice for pain and swelling after surgery. °Do not use any lotions or creams on the incision until instructed by your surgeon. ° °

## 2019-08-25 NOTE — Anesthesia Procedure Notes (Signed)
Anesthesia Regional Block: Adductor canal block   Pre-Anesthetic Checklist: ,, timeout performed, Correct Patient, Correct Site, Correct Laterality, Correct Procedure, Correct Position, site marked, Risks and benefits discussed,  Surgical consent,  Pre-op evaluation,  At surgeon's request and post-op pain management  Laterality: Left  Prep: chloraprep       Needles:  Injection technique: Single-shot  Needle Type: Echogenic Stimulator Needle     Needle Length: 9cm  Needle Gauge: 21     Additional Needles:   Procedures:,,,, ultrasound used (permanent image in chart),,,,  Narrative:  Start time: 08/25/2019 7:15 AM End time: 08/25/2019 7:22 AM Injection made incrementally with aspirations every 5 mL.  Performed by: Personally  Anesthesiologist: Effie Berkshire, MD  Additional Notes: Patient tolerated the procedure well. Local anesthetic introduced in an incremental fashion under minimal resistance after negative aspirations. No paresthesias were elicited. After completion of the procedure, no acute issues were identified and patient continued to be monitored by RN.

## 2019-08-25 NOTE — Transfer of Care (Signed)
Immediate Anesthesia Transfer of Care Note  Patient: Wendy Roth  Procedure(s) Performed: Procedure(s): COMPUTER ASSISTED TOTAL KNEE ARTHROPLASTY (Left)  Patient Location: PACU  Anesthesia Type:Spinal  Level of Consciousness: awake, alert  and oriented  Airway & Oxygen Therapy: Patient Spontanous Breathing  Post-op Assessment: Report given to RN and Post -op Vital signs reviewed and stable  Post vital signs: Reviewed and stable  Last Vitals:  Vitals:   08/25/19 0552  BP: 102/66  Pulse: 62  Resp: 19  Temp: 36.6 C  SpO2: [id number]    Complications: No apparent anesthesia complications

## 2019-08-25 NOTE — Anesthesia Procedure Notes (Signed)
Spinal  Patient location during procedure: OR Start time: 08/25/2019 7:50 AM Staffing Anesthesiologist: Effie Berkshire, MD Resident/CRNA: Gerald Leitz, CRNA Performed: resident/CRNA  Preanesthetic Checklist Completed: patient identified, site marked, surgical consent, pre-op evaluation, timeout performed, IV checked, risks and benefits discussed and monitors and equipment checked Spinal Block Patient position: sitting Prep: DuraPrep Patient monitoring: heart rate, continuous pulse ox, blood pressure and cardiac monitor Approach: midline Location: L3-4 Injection technique: single-shot Needle Needle type: Introducer and Pencan  Needle gauge: 24 G Needle length: 9 cm Assessment Sensory level: T4 Additional Notes Negative paresthesia. Negative blood return. Positive free-flowing CSF. Expiration date of kit checked and confirmed. Patient tolerated procedure well, without complications.

## 2019-08-25 NOTE — Op Note (Signed)
OPERATIVE REPORT  SURGEON: Rod Can, MD   ASSISTANT: Nehemiah Massed, PA-C.  PREOPERATIVE DIAGNOSIS: Left knee arthritis.   POSTOPERATIVE DIAGNOSIS: Left knee arthritis.   PROCEDURE: Left total knee arthroplasty.   IMPLANTS: Stryker Triathlon CR femur, size 5. Stryker Tritanium tibia, size 4. X3 polyethelyene insert, size 9 mm, CR. 3 button asymmetric patella, size 35 mm.  ANESTHESIA:  MAC, Regional and Spinal  TOURNIQUET TIME: Not utilized.   ESTIMATED BLOOD LOSS:-200 mL    ANTIBIOTICS: 2 g Ancef.  DRAINS: None.  COMPLICATIONS: None   CONDITION: PACU - hemodynamically stable.   BRIEF CLINICAL NOTE: Wendy Roth is a 85 y.o. female with a long-standing history of Left knee arthritis. After failing conservative management, the patient was indicated for total knee arthroplasty. The risks, benefits, and alternatives to the procedure were explained, and the patient elected to proceed.  PROCEDURE IN DETAIL: Adductor canal block was obtained in the pre-op holding area. Once inside the operative room, spinal anesthesia was obtained, and a foley catheter was inserted. The patient was then positioned, a nonsterile tourniquet was placed, and the lower extremity was prepped and draped in the normal sterile surgical fashion.  A time-out was called verifying side and site of surgery. The patient received IV antibiotics within 60 minutes of beginning the procedure. The tourniquet was not utilized.   An anterior approach to the knee was performed utilizing a midvastus arthrotomy. A medial release was performed and the patellar fat pad was excised. Stryker navigation was used to cut the distal femur perpendicular to the mechanical axis. A freehand patellar resection was performed, and the patella was sized an prepared with 3 lug holes.  Nagivation was used to make a neutral proximal  tibia resection, taking 3 mm of bone from the less affected medial side with 4 degrees of slope. The menisci were excised. A spacer block was placed, and the alignment and balance in extension were confirmed.   The distal femur was sized using the 3-degree external rotation guide referencing the posterior femoral cortex. The appropriate 4-in-1 cutting block was pinned into place. Rotation was checked using Whiteside's line, the epicondylar axis, and then confirmed with a spacer block in flexion. The remaining femoral cuts were performed, taking care to protect the MCL.  The tibia was sized and the trial tray was pinned into place. The remaining trail components were inserted. The knee was stable to varus and valgus stress through a full range of motion. The patella tracked centrally, and the PCL was well balanced. The trial components were removed, and the proximal tibial surface was prepared. Final components were impacted into place. The knee was tested for a final time and found to be well balanced.   The wound was copiously irrigated with Irrisept solution and normal saline using pule lavage.  Marcaine solution was injected into the periarticular soft tissue.  The wound was closed in layers using #1 Vicryl and Stratafix for the fascia, 2-0 Vicryl for the subcutaneous fat, 2-0 Monocryl for the deep dermal layer, 3-0 running Monocryl subcuticular Stitch, and 4-0 Monocryl stay sutures at both ends of the wound. Dermabond was applied to the skin.  Once the glue was fully dried, an Aquacell Ag and compressive dressing were applied.  Tthe patient was transported to the recovery room in stable condition.  Sponge, needle, and instrument counts were correct at the end of the case x2.  The patient tolerated the procedure well and there were no known complications.  Please note that  a surgical assistant was a medical necessity for this procedure in order to perform it in a safe and expeditious manner. Surgical  assistant was necessary to retract the ligaments and vital neurovascular structures to prevent injury to them and also necessary for proper positioning of the limb to allow for anatomic placement of the prosthesis.

## 2019-08-25 NOTE — Evaluation (Signed)
Physical Therapy Evaluation Patient Details Name: Wendy Roth MRN: [id number] DOB: [birth year] Today's Date: 08/25/2019   History of Present Illness  Pt is 85 yo female s/p L TKA on 08/25/19.  PMH includes HTN, DM, Lumbar spine sx, spinal stenosis, depression, hypothyroidism, GERD, anxiety, and see H and P for full PMH.  Clinical Impression  Pt is s/p L TKA on 08/25/19 resulting in the deficits listed below (see PT Problem List). She was lethargic at time of evaluation, but able to perform SLR and ambulate 49' with PT.  Required min A for transfers today.  Required cues for safe transfers and use of RW. Pt typically uses a rollator and needed cues to keep RW close.  Pt will benefit from skilled PT to increase their independence and safety with mobility to allow discharge to the venue listed below.      Follow Up Recommendations Follow surgeon's recommendation for DC plan and follow-up therapies    Equipment Recommendations  Rolling walker with 5" wheels    Recommendations for Other Services       Precautions / Restrictions Precautions Precautions: Fall Restrictions Weight Bearing Restrictions: Yes LLE Weight Bearing: Weight bearing as tolerated      Mobility  Bed Mobility Overal bed mobility: Needs Assistance Bed Mobility: Sit to Supine;Supine to Sit     Supine to sit: Min assist Sit to supine: Min assist   General bed mobility comments: cues for technique and use of bed rails; pt performed w log roll technique  Transfers Overall transfer level: Needs assistance Equipment used: Rolling walker (2 wheeled) Transfers: Sit to/from Stand Sit to Stand: Min assist         General transfer comment: Performed x 2; cues for safe hand placement  Ambulation/Gait Ambulation/Gait assistance: Min assist Gait Distance (Feet): 70 Feet Assistive device: Rolling walker (2 wheeled) Gait Pattern/deviations: Step-to pattern;Decreased stride length;Decreased stance time -  left Gait velocity: decreased   General Gait Details: cues to keep RW close  Stairs            Wheelchair Mobility    Modified Rankin (Stroke Patients Only)       Balance Overall balance assessment: Needs assistance Sitting-balance support: Bilateral upper extremity supported;Feet supported Sitting balance-Leahy Scale: Fair     Standing balance support: Bilateral upper extremity supported;During functional activity Standing balance-Leahy Scale: Fair                               Pertinent Vitals/Pain Pain Assessment: 0-10 Pain Score: 3  Pain Location: L knee Pain Descriptors / Indicators: Discomfort Pain Intervention(s): Limited activity within patient's tolerance;Ice applied;Premedicated before session   Pt was on 2 LPM O2 with sats 95%; used RA during PT and sats 93%; back on 2 LPM post PT    Home Living Family/patient expects to be discharged to:: Other (Comment)(retirement community) Living Arrangements: Spouse/significant other Available Help at Discharge: Family Type of Home: Independent living facility Home Access: Level entry     Home Layout: One level Home Equipment: Environmental consultant - 4 wheels;Shower seat      Prior Function Level of Independence: Independent with assistive device(s)         Comments: Uses a rollator     Hand Dominance        Extremity/Trunk Assessment   Upper Extremity Assessment Upper Extremity Assessment: Overall WFL for tasks assessed    Lower Extremity Assessment Lower Extremity Assessment: LLE deficits/detail;RLE deficits/detail  RLE Deficits / Details: Reports arthritis R knee;  Strength 5/5 throughout; ROM WFL RLE Sensation: WNL LLE Deficits / Details: L ankle and hip ROM: WFL; L knee lacks 8 ext, 75 flex;  L ankle DF 5/5; L knee ext 3/5 not further tested; L hip 3/5 throughout not furhter tested LLE Sensation: WNL    Cervical / Trunk Assessment Cervical / Trunk Assessment: Normal  Communication    Communication: No difficulties  Cognition Arousal/Alertness: Lethargic Behavior During Therapy: WFL for tasks assessed/performed Overall Cognitive Status: Within Functional Limits for tasks assessed                                        General Comments General comments (skin integrity, edema, etc.): vss    Exercises Total Joint Exercises Ankle Circles/Pumps: AROM;Both;10 reps;Supine Quad Sets: AROM;Both;10 reps;Supine Heel Slides: AAROM;Left;10 reps;Supine Goniometric ROM: L knee 8 to 75 degrees   Assessment/Plan    PT Assessment Patient needs continued PT services  PT Problem List Decreased strength;Decreased mobility;Decreased safety awareness;Decreased range of motion;Decreased activity tolerance;Decreased balance;Decreased knowledge of use of DME       PT Treatment Interventions DME instruction;Therapeutic activities;Modalities;Gait training;Therapeutic exercise;Patient/family education;Stair training;Balance training;Functional mobility training;Neuromuscular re-education    PT Goals (Current goals can be found in the Care Plan section)  Acute Rehab PT Goals Patient Stated Goal: return to home (retirement community) and have outpt PT at office PT Goal Formulation: With patient Time For Goal Achievement: 09/08/19 Potential to Achieve Goals: Good Additional Goals Additional Goal #1: L knee ROM 0 to 90 degrees for gait and stairs    Frequency 7X/week   Barriers to discharge        Co-evaluation               AM-PAC PT "6 Clicks" Mobility  Outcome Measure Help needed turning from your back to your side while in a flat bed without using bedrails?: A Little Help needed moving from lying on your back to sitting on the side of a flat bed without using bedrails?: A Little Help needed moving to and from a bed to a chair (including a wheelchair)?: A Little Help needed standing up from a chair using your arms (e.g., wheelchair or bedside chair)?: A  Little Help needed to walk in hospital room?: A Little Help needed climbing 3-5 steps with a railing? : A Lot 6 Click Score: 17    End of Session Equipment Utilized During Treatment: Gait belt Activity Tolerance: Patient tolerated treatment well Patient left: in bed;with bed alarm set;with SCD's reapplied;with call bell/phone within reach Nurse Communication: Mobility status PT Visit Diagnosis: Other abnormalities of gait and mobility (R26.89);Unsteadiness on feet (R26.81);Muscle weakness (generalized) (M62.81)    Time: [id number] PT Time Calculation (min) (ACUTE ONLY): 35 min   Charges:   PT Evaluation $PT Eval Moderate Complexity: 1 Mod PT Treatments $Therapeutic Exercise: 8-22 mins        Maggie Font, PT Acute Rehab Services Pager [phone] [facility] [phone] [facility] [phone]   Karlton Lemon 08/25/2019, 3:44 PM

## 2019-08-25 NOTE — Anesthesia Postprocedure Evaluation (Signed)
Anesthesia Post Note  Patient: Wendy Roth  Procedure(s) Performed: COMPUTER ASSISTED TOTAL KNEE ARTHROPLASTY (Left Knee)     Patient location during evaluation: PACU Anesthesia Type: Spinal Level of consciousness: oriented and awake and alert Pain management: pain level controlled Vital Signs Assessment: post-procedure vital signs reviewed and stable Respiratory status: spontaneous breathing, respiratory function stable and patient connected to nasal cannula oxygen Cardiovascular status: blood pressure returned to baseline and stable Postop Assessment: no headache, no backache, no apparent nausea or vomiting and spinal receding Anesthetic complications: no    Last Vitals:  Vitals:   08/25/19 1247 08/25/19 1347  BP: (!) 153/71 119/71  Pulse: 63 61  Resp: 17 17  Temp: (!) 35.9 C (!) 36.4 C  SpO2: 92% 97%    Last Pain:  Vitals:   08/25/19 1333  TempSrc:   PainSc: [place]

## 2019-08-26 ENCOUNTER — Encounter (HOSPITAL_COMMUNITY): Payer: Self-pay | Admitting: Orthopedic Surgery

## 2019-08-26 DIAGNOSIS — Z96652 Presence of left artificial knee joint: Secondary | ICD-10-CM

## 2019-08-26 DIAGNOSIS — Z471 Aftercare following joint replacement surgery: Secondary | ICD-10-CM

## 2019-08-26 HISTORY — DX: Presence of left artificial knee joint: Z96.652

## 2019-08-26 HISTORY — DX: Aftercare following joint replacement surgery: Z47.1

## 2019-08-26 LAB — GLUCOSE, CAPILLARY
Glucose-Capillary: 147 mg/dL — ABNORMAL HIGH (ref 70–99)
Glucose-Capillary: 149 mg/dL — ABNORMAL HIGH (ref 70–99)
Glucose-Capillary: 160 mg/dL — ABNORMAL HIGH (ref 70–99)
Glucose-Capillary: 121 mg/dL — ABNORMAL HIGH (ref 70–99)

## 2019-08-26 LAB — CBC
HCT: 30.3 % — ABNORMAL LOW (ref 36.0–46.0)
Hemoglobin: 9.3 g/dL — ABNORMAL LOW (ref 12.0–15.0)
MCH: 31.7 pg (ref 26.0–34.0)
MCHC: 30.7 g/dL (ref 30.0–36.0)
MCV: 103.4 fL — ABNORMAL HIGH (ref 80.0–100.0)
Platelets: 208 10*3/uL (ref 150–400)
RBC: 2.93 MIL/uL — ABNORMAL LOW (ref 3.87–5.11)
RDW: 14.4 % (ref 11.5–15.5)
WBC: 8.4 10*3/uL (ref 4.0–10.5)
nRBC: 0 % (ref 0.0–0.2)

## 2019-08-26 LAB — BASIC METABOLIC PANEL
Anion gap: 8 (ref 5–15)
BUN: 19 mg/dL (ref 8–23)
CO2: 26 mmol/L (ref 22–32)
Calcium: 8.1 mg/dL — ABNORMAL LOW (ref 8.9–10.3)
Chloride: 104 mmol/L (ref 98–111)
Creatinine, Ser: 0.95 mg/dL (ref 0.44–1.00)
GFR calc Af Amer: >60 mL/min (ref >60)
GFR calc non Af Amer: 55 mL/min — ABNORMAL LOW (ref >60)
Glucose, Bld: 114 mg/dL — ABNORMAL HIGH (ref 70–99)
Potassium: 4.4 mmol/L (ref 3.5–5.1)
Sodium: 138 mmol/L (ref 135–145)

## 2019-08-26 MED ORDER — SENNA 8.6 MG PO TABS: 1.0000 | ORAL_TABLET | Freq: Two times a day (BID) | ORAL | 1 refills | Status: AC

## 2019-08-26 MED ORDER — DOCUSATE SODIUM 100 MG PO CAPS: 100.0000 mg | ORAL_CAPSULE | Freq: Two times a day (BID) | ORAL | 1 refills | Status: AC

## 2019-08-26 MED ORDER — ASPIRIN 81 MG PO CHEW: 81.0000 mg | CHEWABLE_TABLET | Freq: Two times a day (BID) | ORAL | 1 refills | Status: AC

## 2019-08-26 MED ORDER — ONDANSETRON HCL 4 MG PO TABS: 4.0000 mg | ORAL_TABLET | Freq: Four times a day (QID) | ORAL | 0 refills | Status: AC | PRN

## 2019-08-26 MED ORDER — HYDROCODONE-ACETAMINOPHEN 5-325 MG PO TABS: 1.0000 | ORAL_TABLET | ORAL | 0 refills | Status: AC | PRN

## 2019-08-26 NOTE — Plan of Care (Signed)
Plan of care reviewed and discussed with the patient. 

## 2019-08-26 NOTE — Progress Notes (Signed)
Physical Therapy Treatment Patient Details Name: Wendy Roth MRN: [id number] DOB: [birth year] Today's Date: 08/26/2019    History of Present Illness Pt is 85 yo female s/p L TKA on 08/25/19.  PMH includes HTN, DM, Lumbar spine sx, spinal stenosis, depression, hypothyroidism, GERD, anxiety, and see H and P for full PMH.    PT Comments    POD # 1 am session Pt slow, sleepy and groggy.  Assisted OOB to recliner only.  General transfer comment: Required increased assist due to increased groggy state (meds?) only able to transfer from bed to recliner.  Attempted to perform TE's however pt unable to stay alert enough to complete.   Follow Up Recommendations  Follow surgeon's recommendation for DC plan and follow-up therapies     Equipment Recommendations  Other (comment)(TBD at SNF)    Recommendations for Other Services       Precautions / Restrictions Precautions Precautions: Fall;Knee Restrictions Weight Bearing Restrictions: No LLE Weight Bearing: Weight bearing as tolerated    Mobility  Bed Mobility Overal bed mobility: Needs Assistance Bed Mobility: Supine to Sit     Supine to sit: Max assist     General bed mobility comments: required increased assist and increased time.  Groggy.  Transfers Overall transfer level: Needs assistance Equipment used: Rolling walker (2 wheeled) Transfers: Sit to/from Stand Sit to Stand: +2 physical assistance;+2 safety/equipment;Max assist         General transfer comment: Required increased assist due to increased groggy state (meds?) only able to transfer from bed to recliner  Ambulation/Gait             General Gait Details: nable this session   Stairs             Wheelchair Mobility    Modified Rankin (Stroke Patients Only)       Balance                                            Cognition Arousal/Alertness: Lethargic                                      General Comments: sleepy/groggy      Exercises      General Comments        Pertinent Vitals/Pain Pain Assessment: No/denies pain Pain Location: L knee Pain Intervention(s): Monitored during session;Repositioned;Ice applied    Home Living                      Prior Function            PT Goals (current goals can now be found in the care plan section) Progress towards PT goals: Progressing toward goals    Frequency    7X/week      PT Plan Current plan remains appropriate    Co-evaluation              AM-PAC PT "6 Clicks" Mobility   Outcome Measure  Help needed turning from your back to your side while in a flat bed without using bedrails?: A Little Help needed moving from lying on your back to sitting on the side of a flat bed without using bedrails?: A Little Help needed moving to and from a bed to a chair (including a wheelchair)?:  A Little Help needed standing up from a chair using your arms (e.g., wheelchair or bedside chair)?: A Little Help needed to walk in hospital room?: A Little Help needed climbing 3-5 steps with a railing? : A Little 6 Click Score: 18    End of Session Equipment Utilized During Treatment: Gait belt Activity Tolerance: Patient tolerated treatment well Patient left: in chair;with chair alarm set;with call bell/phone within reach Nurse Communication: Mobility status(pt unable to amb due to groggy/drunk/sleepy state) PT Visit Diagnosis: Other abnormalities of gait and mobility (R26.89);Unsteadiness on feet (R26.81);Muscle weakness (generalized) (M62.81)     Time: 0935-1000 PT Time Calculation (min) (ACUTE ONLY): 25 min  Charges:  $Gait Training: 8-22 mins $Therapeutic Activity: 8-22 mins                     Rica Koyanagi  PTA Acute  Rehabilitation Services Pager      [phone] Office      [phone]

## 2019-08-26 NOTE — Progress Notes (Signed)
Physical Therapy Treatment Patient Details Name: Wendy Roth MRN: [id number] DOB: 1932/01/30 Today's Date: 08/26/2019    History of Present Illness Pt is 85 yo female s/p L TKA on 08/25/19.  PMH includes HTN, DM, Lumbar spine sx, spinal stenosis, depression, hypothyroidism, GERD, anxiety, and see H and P for full PMH.    PT Comments    POD #1 pm session. Pt was very lethargic and sleepy/groggy today. Pt was willing to get up and sit on the [facility] and go for a short walk.  General bed mobility comments: Pt required incrased time and mod HHA to sit on the EOB.  Pt very groggy. General transfer comment: Pt required increased time and assistance to stand up. Pt needed VC's once standing to "stand up tall" and to open her eyes. General Gait Details: Pt was able to ambulate 17 ft with a RW +2 assist. Pt required VC's to take big steps and for proper posture.    Follow Up Recommendations  Follow surgeon's recommendation for DC plan and follow-up therapies     Equipment Recommendations  Other (comment)    Recommendations for Other Services       Precautions / Restrictions Precautions Precautions: Fall;Knee Restrictions Weight Bearing Restrictions: No LLE Weight Bearing: Weight bearing as tolerated    Mobility  Bed Mobility Overal bed mobility: Needs Assistance Bed Mobility: Supine to Sit     Supine to sit: Mod assist;Max assist     General bed mobility comments: Pt required incrased time and mod HHA to sit on the EOB.  Pt very groggy.  Transfers Overall transfer level: Needs assistance Equipment used: Rolling walker (2 wheeled) Transfers: Sit to/from Stand Sit to Stand: +2 physical assistance;+2 safety/equipment;Mod assist         General transfer comment: Pt required increased time and assistance to stand up. Pt needed VC's once standing to "stand up tall" and to open her eyes.  Ambulation/Gait Ambulation/Gait assistance: Mod assist;+2 physical assistance Gait  Distance (Feet): 17 Feet Assistive device: Rolling walker (2 wheeled) Gait Pattern/deviations: Step-to pattern;Decreased stride length;Decreased stance time - left;Trunk flexed;Antalgic Gait velocity: decreased   General Gait Details: Pt was able to ambulate 17 ft with a RW +2 assist. Pt required VC's to take big steps and for proper posture   Stairs             Wheelchair Mobility    Modified Rankin (Stroke Patients Only)       Balance                                            Cognition Arousal/Alertness: Lethargic Behavior During Therapy: WFL for tasks assessed/performed Overall Cognitive Status: Within Functional Limits for tasks assessed                                 General Comments: sleepy/groggy      Exercises      General Comments        Pertinent Vitals/Pain Pain Assessment: Faces Faces Pain Scale: Hurts little more Pain Location: L knee Pain Descriptors / Indicators: Discomfort;Aching;Sore Pain Intervention(s): Monitored during session;Repositioned;Ice applied    Home Living                      Prior Function  PT Goals (current goals can now be found in the care plan section) Progress towards PT goals: Progressing toward goals    Frequency    7X/week      PT Plan Current plan remains appropriate    Co-evaluation              AM-PAC PT "6 Clicks" Mobility   Outcome Measure  Help needed turning from your back to your side while in a flat bed without using bedrails?: A Little Help needed moving from lying on your back to sitting on the side of a flat bed without using bedrails?: A Little Help needed moving to and from a bed to a chair (including a wheelchair)?: A Little Help needed standing up from a chair using your arms (e.g., wheelchair or bedside chair)?: A Little Help needed to walk in hospital room?: A Little Help needed climbing 3-5 steps with a railing? : A Lot 6  Click Score: 17    End of Session Equipment Utilized During Treatment: Gait belt Activity Tolerance: Patient limited by lethargy;Patient limited by fatigue Patient left: in chair;with chair alarm set;with call bell/phone within reach Nurse Communication: Mobility status PT Visit Diagnosis: Other abnormalities of gait and mobility (R26.89);Unsteadiness on feet (R26.81);Muscle weakness (generalized) (M62.81)     Time: [id number] PT Time Calculation (min) (ACUTE ONLY): 26 min  Charges:  $Gait Training: 8-22 mins $Therapeutic Activity: 8-22 mins                     Excell Seltzer, [place] [facility]

## 2019-08-26 NOTE — Progress Notes (Addendum)
    Subjective:  Patient reports pain as mild to moderate.  Denies N/V/CP/SOB. States she feels sleepy. Currently on 2L O2 [place].  Objective:   VITALS:   Vitals:   08/25/19 2213 08/26/19 0154 08/26/19 0550 08/26/19 0952  BP: 126/80 129/84 (!) 117/54 (!) 112/48  Pulse: (!) 59 68 62 62  Resp: 16 16 15    Temp: 98.4 F (36.9 C) 98.7 F (37.1 C) 98.8 F (37.1 C)   TempSrc: Oral Oral Oral   SpO2: 97% 99% 98% (!) 89%  Weight:      Height:        NAD, mildly sleepy, cooperative ABD soft Sensation intact distally Intact pulses distally Dorsiflexion/Plantar flexion intact Incision: dressing C/D/I Compartment soft   Lab Results  Component Value Date   WBC 8.4 08/26/2019   HGB 9.3 (L) 08/26/2019   HCT 30.3 (L) 08/26/2019   MCV 103.4 (H) 08/26/2019   PLT 208 08/26/2019   BMET    Component Value Date/Time   NA 138 08/26/2019 0231   NA 141 06/29/2019 1158   NA 142 01/23/2015 1017   K 4.4 08/26/2019 0231   K 4.8 01/23/2015 1017   CL 104 08/26/2019 0231   CL 105 02/21/2013 0905   CO2 26 08/26/2019 0231   CO2 27 01/23/2015 1017   GLUCOSE 114 (H) 08/26/2019 0231   GLUCOSE 118 01/23/2015 1017   GLUCOSE 126 (H) 02/21/2013 0905   BUN 19 08/26/2019 0231   BUN 20 06/29/2019 1158   BUN 25.0 01/23/2015 1017   CREATININE 0.95 08/26/2019 0231   CREATININE 1.1 01/23/2015 1017   CALCIUM 8.1 (L) 08/26/2019 0231   CALCIUM 9.2 01/23/2015 1017   GFRNONAA 55 (L) 08/26/2019 0231   GFRAA >60 08/26/2019 0231     Assessment/Plan: 1 Day Post-Op   Principal Problem:   Osteoarthritis of left knee   WBAT with walker DVT ppx: Aspirin, SCDs, TEDS PO pain control: judicious use of narcotics, avoid IV PT/OT Hypoxia / O2 requirement: likely due to sedation from anesthesia & pain meds, monitor, wean O2 as tolerated  Dispo: [place] requires repeat Covid-19 test which was ordered, recheck labs in am, wean O2, plan for d/c tomorrow, OPPT to start Tuesday 11/24 4PM at [facility] 08/26/2019, 10:49 AM   Rod Can, MD [phone] [place] is now [facility]  [place] [address], [address], [place],  [address] Phone: [phone] [email or web address]  [employer]

## 2019-08-26 NOTE — Discharge Summary (Signed)
Physician Discharge Summary  Patient ID: Wendy Roth MRN: [id number] DOB/AGE: January 26, 1932 85 y.o.  Admit date: 08/25/2019 Discharge date: 08/27/2019  Admission Diagnoses:  Osteoarthritis of left knee  Discharge Diagnoses:  Principal Problem:   Osteoarthritis of left knee   Past Medical History:  Diagnosis Date  . 1st degree AV block 12/06/2018   Noted on EKG  . Anxiety   . Arthritis   . Back pain   . Breast cancer ([place])   . Carpal tunnel syndrome   . Complication of anesthesia    Delirium post anesthesia  . Coronary artery disease 2005   stent placed  . Depression   . Diabetes mellitus without complication (HCC)    diet controlled.   . Diverticulosis   . Fatigue   . GERD (gastroesophageal reflux disease)    "years ago" but still takes Prilosec  . Glaucoma    left eye  . History of kidney stones   . Hypercholesteremia   . Hypertension   . Hypothyroidism   . Nasal turbinate hypertrophy   . Restless leg syndrome   . Seizures ([place])    as a baby  . SVT (supraventricular tachycardia) ([place])   . Umbilical hernia     Surgeries: Procedure(s): COMPUTER ASSISTED TOTAL KNEE ARTHROPLASTY on 08/25/2019   Consultants (if any):   Discharged Condition: Improved  Hospital Course: Wendy Roth is an 85 y.o. female who was admitted 08/25/2019 with a diagnosis of Osteoarthritis of left knee and went to the operating room on 08/25/2019 and underwent the above named procedures.    She was given perioperative antibiotics:  Anti-infectives (From admission, onward)   Start     Dose/Rate Route Frequency Ordered Stop   08/25/19 1330  ceFAZolin (ANCEF) IVPB 2g/100 mL premix     2 g 200 mL/hr over 30 Minutes Intravenous Every 6 hours 08/25/19 1136 08/25/19 2102   08/25/19 0600  ceFAZolin (ANCEF) IVPB 2g/100 mL premix     2 g 200 mL/hr over 30 Minutes Intravenous On call to O.R. 08/25/19 [id number] 08/25/19 0739    .  She was given sequential compression devices, early  ambulation, and ASA for DVT prophylaxis.  She benefited maximally from the hospital stay and there were no complications.    Recent vital signs:  Vitals:   08/27/19 0529 08/27/19 0950  BP: (!) 161/79 (!) 125/55  Pulse: 88 72  Resp: 16   Temp: 98.6 F (37 C)   SpO2: 95%     Recent laboratory studies:  Lab Results  Component Value Date   HGB 9.0 (L) 08/27/2019   HGB 9.3 (L) 08/26/2019   HGB 13.3 08/24/2019   Lab Results  Component Value Date   WBC 8.5 08/27/2019   PLT 234 08/27/2019   Lab Results  Component Value Date   INR 1.0 08/24/2019   Lab Results  Component Value Date   NA 138 08/26/2019   K 4.4 08/26/2019   CL 104 08/26/2019   CO2 26 08/26/2019   BUN 19 08/26/2019   CREATININE 0.95 08/26/2019   GLUCOSE 114 (H) 08/26/2019    Discharge Medications:   Allergies as of 08/27/2019      Reactions   Cortisone Shortness Of Breath   She blames symptoms of SOB & palpitations on shot of cortisone.      Medication List    STOP taking these medications   acetaminophen 500 MG tablet Commonly known as: TYLENOL     TAKE these medications   aspirin  81 MG chewable tablet Chew 1 tablet (81 mg total) by mouth 2 (two) times daily.   atorvastatin 10 MG tablet Commonly known as: LIPITOR Take 10 mg by mouth at bedtime.   clonazePAM 1 MG tablet Commonly known as: KLONOPIN Take 1 mg by mouth 2 (two) times daily. What changed: Another medication with the same name was removed. Continue taking this medication, and follow the directions you see here.   diltiazem 120 MG 24 hr capsule Commonly known as: Cardizem CD Take 1 capsule (120 mg total) by mouth daily. What changed: when to take this   docusate sodium 100 MG capsule Commonly known as: COLACE Take 1 capsule (100 mg total) by mouth 2 (two) times daily.   donepezil 5 MG tablet Commonly known as: ARICEPT Take 5 mg by mouth every evening.   gabapentin 300 MG capsule Commonly known as: NEURONTIN Take 300 mg  by mouth 3 (three) times daily.   HYDROcodone-acetaminophen 5-325 MG tablet Commonly known as: NORCO/VICODIN Take 1-2 tablets by mouth every 4 (four) hours as needed for moderate pain (pain score 4-6). What changed:   how much to take  when to take this  reasons to take this   hydroxypropyl methylcellulose / hypromellose 2.5 % ophthalmic solution Commonly known as: ISOPTO TEARS / GONIOVISC Place 1 drop into both eyes 3 (three) times daily.   latanoprost 0.005 % ophthalmic solution Commonly known as: XALATAN Place 1 drop into both eyes at bedtime.   levothyroxine 75 MCG tablet Commonly known as: SYNTHROID Take 75 mcg by mouth daily before breakfast.   lisinopril 20 MG tablet Commonly known as: ZESTRIL Take 20 mg by mouth daily.   magnesium oxide 400 (241.3 Mg) MG tablet Commonly known as: MAG-OX Take 1 tablet (400 mg total) by mouth daily.   metoprolol succinate 50 MG 24 hr tablet Commonly known as: TOPROL-XL Take 50 mg by mouth every evening. Take with or immediately following a meal.   omeprazole 40 MG capsule Commonly known as: PRILOSEC Take 40 mg by mouth daily.   ondansetron 4 MG tablet Commonly known as: ZOFRAN Take 1 tablet (4 mg total) by mouth every 6 (six) hours as needed for nausea.   polyethylene glycol 17 g packet Commonly known as: MIRALAX / GLYCOLAX Take 17 g by mouth daily as needed for mild constipation.   primidone 50 MG tablet Commonly known as: MYSOLINE Take 50 mg by mouth 4 (four) times daily.   RisperDAL 0.25 MG tablet Generic drug: risperiDONE Take 0.125 mg by mouth 2 (two) times daily.   rOPINIRole 0.25 MG tablet Commonly known as: REQUIP Take 0.5 mg by mouth at bedtime.   senna 8.6 MG Tabs tablet Commonly known as: SENOKOT Take 1 tablet (8.6 mg total) by mouth 2 (two) times daily.   Vitamin D (Ergocalciferol) 1.25 MG (50000 UT) Caps capsule Commonly known as: DRISDOL Take 50,000 Units by mouth once a week.   Wellbutrin XL  150 MG 24 hr tablet Generic drug: buPROPion Take 150 mg by mouth daily.   Zoloft 100 MG tablet Generic drug: sertraline Take 100 mg by mouth at bedtime.       Diagnostic Studies: Dg Knee Left Port Pacu  Result Date: 08/25/2019 CLINICAL DATA:  Osteoarthritis of the left knee. Status post total knee arthroplasty. EXAM: PORTABLE LEFT KNEE - 1-2 VIEW COMPARISON:  None. FINDINGS: The components of the total knee prosthesis appear in excellent position. No fractures. Expected postsurgical air in fluid in the joint. IMPRESSION: Satisfactory  appearance of the left knee after total knee replacement. Electronically Signed   By: Lorriane Shire M.D.   On: 08/25/2019 10:38    Disposition:   Discharge Instructions    Call MD / Call 911   Complete by: As directed    If you experience chest pain or shortness of breath, CALL 911 and be transported to the hospital emergency room.  If you develope a fever above 101 F, pus (white drainage) or increased drainage or redness at the wound, or calf pain, call your surgeon's office.   Constipation Prevention   Complete by: As directed    Drink plenty of fluids.  Prune juice may be helpful.  You may use a stool softener, such as Colace (over the counter) 100 mg twice a day.  Use MiraLax (over the counter) for constipation as needed.   Diet - low sodium heart healthy   Complete by: As directed    Increase activity slowly as tolerated   Complete by: As directed        Contact information for follow-up providers    Cheria Sadiq, Aaron Edelman, MD. Schedule an appointment as soon as possible for a visit in 2 weeks.   Specialty: Orthopedic Surgery Why: For wound re-check Contact information: [address] [place] [address] [id number]            Contact information for after-discharge care    Destination    [facility] .   Service: Skilled Nursing Contact information: [place] [phone]                   Signed: Bertram Savin 08/31/2019, 12:46 PM

## 2019-08-26 NOTE — NC FL2 (Signed)
[place] MEDICAID FL2 LEVEL OF CARE SCREENING TOOL     IDENTIFICATION  Patient Name: Wendy Roth Birthdate: 12-05-1931 Sex: female Admission Date (Current Location): 08/25/2019  [facility] [place] Number:  Herbalist and Address:  [facility],  [place] [address], [place]      Provider Number: [id number]  Attending Physician Name and Address:  Rod Can, MD  Relative Name and Phone Number:       Current Level of Care: Hospital Recommended Level of Care: [place] Prior Approval Number:    Date Approved/Denied:   PASRR Number:  Pending.  Discharge Plan: SNF    Current Diagnoses: Patient Active Problem List   Diagnosis Date Noted  . Osteoarthritis of left knee 08/25/2019  . Essential hypertension 12/06/2018  . Diabetes mellitus due to underlying condition with unspecified complications ([place]) 123456  . Mixed dyslipidemia 12/06/2018  . Status post lumbar spine operative procedure for decompression of spinal cord 12/17/2017  . Pain in left knee 11/20/2017  . Degenerative spondylolisthesis 11/18/2017  . Degeneration of lumbar intervertebral disc 10/30/2017  . Spinal stenosis of lumbar region 10/30/2017  . Risk for falls 06/03/2017  . Hypoxia 02/07/2017  . SVT (supraventricular tachycardia) ([place]) 02/04/2017  . HLD (hyperlipidemia) 02/04/2017  . Depression 02/04/2017  . Chronic back pain 02/04/2017  . Acute respiratory failure with hypoxia ([place]) 02/04/2017  . Hypothyroidism   . GERD (gastroesophageal reflux disease)   . Anxiety   . Memory loss of 01/27/2017  . Umbilical hernia [id number]  . Hypertrophy of nasal turbinates 09/15/2013  . Type II diabetes mellitus ([place]) 09/15/2013  . Arthropathy 04/13/2013  . Hypertension, benign 04/13/2013  . Restless legs syndrome 04/13/2013  . Family history of breast cancer 02/18/2013  . Family history of malignant neoplasm of female breast 02/18/2013  . Cancer of  lower-inner quadrant of left female breast ([place]) 10/18/2012  . Coronary artery disease 10/07/2003    Orientation RESPIRATION BLADDER Height & Weight     Self, Time, Situation, Place  Normal Continent Weight: 173 lb (78.5 kg) Height:  5' (152.4 cm)  BEHAVIORAL SYMPTOMS/MOOD NEUROLOGICAL BOWEL NUTRITION STATUS      Continent Diet(Carb Modified.)  AMBULATORY STATUS COMMUNICATION OF NEEDS Skin   Extensive Assist Verbally Surgical wounds                       Personal Care Assistance Level of Assistance  Bathing, Feeding, Dressing Bathing Assistance: Limited assistance Feeding assistance: Independent Dressing Assistance: Limited assistance     Functional Limitations Info  Sight, Hearing, Speech Sight Info: Adequate Hearing Info: Adequate Speech Info: Adequate    SPECIAL CARE FACTORS FREQUENCY  PT (By licensed PT), OT (By licensed OT)     PT Frequency: 5x/week OT Frequency: 5x/week            Contractures      Additional Factors Info  Code Status, Allergies, Psychotropic Code Status Info: Fullcode Allergies Info: Allergies: Cortisone Psychotropic Info: Risperdal, Wellbutrin and Zoloft         Current Medications (08/26/2019):  This is the current hospital active medication list Current Facility-Administered Medications  Medication Dose Route Frequency Provider Last Rate Last Dose  . 0.9 %  sodium chloride infusion   Intravenous Continuous Rod Can, MD 30 mL/hr at 08/26/19 [phone]    . acetaminophen (TYLENOL) tablet 325-650 mg  325-650 mg Oral Q6H PRN Rod Can, MD      . alum & Iris Pert  hydroxide-simeth (MAALOX/MYLANTA) 200-200-20 MG/5ML suspension 30 mL  30 mL Oral Q4H PRN Swinteck, Aaron Edelman, MD      . aspirin chewable tablet 81 mg  81 mg Oral BID Rod Can, MD   81 mg at 08/26/19 0851  . atorvastatin (LIPITOR) tablet 10 mg  10 mg Oral QHS Rod Can, MD   10 mg at 08/25/19 2225  . buPROPion (WELLBUTRIN XL) 24 hr tablet 150 mg  150 mg Oral Daily  Rod Can, MD   150 mg at 08/26/19 0852  . clonazePAM (KLONOPIN) tablet 1 mg  1 mg Oral BID Rod Can, MD   1 mg at 08/26/19 0852  . diltiazem (CARDIZEM CD) 24 hr capsule 120 mg  120 mg Oral QPM Rod Can, MD   120 mg at 08/25/19 1603  . diphenhydrAMINE (BENADRYL) 12.5 MG/5ML elixir 12.5-25 mg  12.5-25 mg Oral Q4H PRN Swinteck, Aaron Edelman, MD      . docusate sodium (COLACE) capsule 100 mg  100 mg Oral BID Rod Can, MD   100 mg at 08/26/19 0851  . donepezil (ARICEPT) tablet 5 mg  5 mg Oral QPM Rod Can, MD   5 mg at 08/25/19 1601  . gabapentin (NEURONTIN) capsule 300 mg  300 mg Oral TID Rod Can, MD   300 mg at 08/26/19 0852  . HYDROcodone-acetaminophen (NORCO) 7.5-325 MG per tablet 1-2 tablet  1-2 tablet Oral Q4H PRN Rod Can, MD   1 tablet at 08/26/19 0547  . HYDROcodone-acetaminophen (NORCO/VICODIN) 5-325 MG per tablet 1-2 tablet  1-2 tablet Oral Q4H PRN Rod Can, MD   1 tablet at 08/25/19 2222  . latanoprost (XALATAN) 0.005 % ophthalmic solution 1 drop  1 drop Both Eyes QHS Rod Can, MD   1 drop at 08/25/19 2226  . levothyroxine (SYNTHROID) tablet 75 mcg  75 mcg Oral QAC breakfast Rod Can, MD   75 mcg at 08/26/19 0547  . lisinopril (ZESTRIL) tablet 20 mg  20 mg Oral Daily Rod Can, MD   20 mg at 08/26/19 0852  . magnesium oxide (MAG-OX) tablet 400 mg  400 mg Oral Daily Rod Can, MD   400 mg at 08/26/19 0852  . menthol-cetylpyridinium (CEPACOL) lozenge 3 mg  1 lozenge Oral PRN Rod Can, MD   3 mg at 08/25/19 2227   Or  . phenol (CHLORASEPTIC) mouth spray 1 spray  1 spray Mouth/Throat PRN Swinteck, Aaron Edelman, MD      . methocarbamol (ROBAXIN) tablet 500 mg  500 mg Oral Q6H PRN Rod Can, MD   500 mg at 08/26/19 0156   Or  . methocarbamol (ROBAXIN) 500 mg in dextrose 5 % 50 mL IVPB  500 mg Intravenous Q6H PRN Rod Can, MD   Stopped at 08/25/19 1146  . metoCLOPramide (REGLAN) tablet 5-10 mg  5-10 mg Oral  Q8H PRN Swinteck, Aaron Edelman, MD       Or  . metoCLOPramide (REGLAN) injection 5-10 mg  5-10 mg Intravenous Q8H PRN Swinteck, Aaron Edelman, MD      . metoprolol succinate (TOPROL-XL) 24 hr tablet 50 mg  50 mg Oral QPM Rod Can, MD   50 mg at 08/25/19 1728  . morphine 2 MG/ML injection 0.5-1 mg  0.5-1 mg Intravenous Q2H PRN Swinteck, Aaron Edelman, MD      . ondansetron [facility]) tablet 4 mg  4 mg Oral Q6H PRN Swinteck, Aaron Edelman, MD       Or  . ondansetron (ZOFRAN) injection 4 mg  4 mg Intravenous Q6H PRN Rod Can, MD      .  pantoprazole (PROTONIX) EC tablet 80 mg  80 mg Oral Daily Rod Can, MD   80 mg at 08/26/19 0856  . polyethylene glycol (MIRALAX / GLYCOLAX) packet 17 g  17 g Oral Daily PRN Swinteck, Aaron Edelman, MD      . primidone (MYSOLINE) tablet 50 mg  50 mg Oral QID Rod Can, MD   50 mg at 08/26/19 0856  . risperiDONE (RISPERDAL) tablet 0.125 mg  0.125 mg Oral BID Rod Can, MD   0.125 mg at 08/26/19 0852  . rOPINIRole (REQUIP) tablet 0.5 mg  0.5 mg Oral QHS Rod Can, MD   0.5 mg at 08/25/19 2224  . senna (SENOKOT) tablet 8.6 mg  1 tablet Oral BID Rod Can, MD   8.6 mg at 08/26/19 0852  . sertraline (ZOLOFT) tablet 100 mg  100 mg Oral QHS Rod Can, MD   100 mg at 08/25/19 2224  . [START ON 08/29/2019] Vitamin D (Ergocalciferol) (DRISDOL) capsule 50,000 Units  50,000 Units Oral Weekly Swinteck, Aaron Edelman, MD         Discharge Medications: Please see discharge summary for a list of discharge medications.  Relevant Imaging Results:  Relevant Lab Results:   Additional Information SSN [id number]  Lia Hopping, LCSW

## 2019-08-26 NOTE — TOC Initial Note (Addendum)
Transition of Care [facility]) - Initial/Assessment Note    Patient Details  Name: Wendy Roth MRN: [id number] Date of Birth: [birth year]  [facility]) CM/SW Contact:    Lia Hopping, [place] Phone Number: 08/26/2019, 10:21 AM  Clinical Narrative:                 CSW reached out to [place] at [facility].She report the patient is a resident of the [place] [facility] and  have scheduled the patient to go to rehab (SNF) upon return. She requests a new COVID-19 test. Per physician notes the patient will complete OPPT starting Tuesday 11/24. Care Manager reports she follow up with the patient care team at SNF to determine the patient therapy plan.The facility has agreed to transport the patient [place] back to [facility] today, it will be picked up at 3:00pm. Nurse notified.  FL2 to be completed.    PTAR to transport [place].  Patient is LTC resident at [facility]. A PASRR was submitted at hospital. The facility will accept the patient and continue the PASRR process.    Expected Discharge Plan: Skilled Nursing Facility Barriers to Discharge: Other (comment)(COVID-19 test)   Patient Goals and CMS Choice     Choice offered to / list presented to : NA  Expected Discharge Plan and Services Expected Discharge Plan: Skilled Nursing Facility In-house Referral: Clinical Social Work Discharge Planning Services: CM Consult Post Acute Care Choice: [place] Living arrangements for the past 2 months: [place]                                      Prior Living Arrangements/Services Living arrangements for the past 2 months: [place] Lives with:: Facility Resident Patient language and need for interpreter reviewed:: No Do you feel safe going back to the place where you live?: Yes      Need for Family Participation in Patient Care: Yes (Comment) Care giver support system in place?: Yes  (comment) Current home services: DME Criminal Activity/Legal Involvement Pertinent to Current Situation/Hospitalization: No - Comment as needed  Activities of Daily Living Home Assistive Devices/Equipment: Walker (specify type), Blood pressure cuff, CBG Meter, Shower chair without back ADL Screening (condition at time of admission) Patient's cognitive ability adequate to safely complete daily activities?: Yes Is the patient deaf or have difficulty hearing?: No Does the patient have difficulty seeing, even when wearing glasses/contacts?: No Does the patient have difficulty concentrating, remembering, or making decisions?: No Patient able to express need for assistance with ADLs?: Yes Does the patient have difficulty dressing or bathing?: No Independently performs ADLs?: Yes (appropriate for developmental age) Does the patient have difficulty walking or climbing stairs?: Yes Weakness of Legs: Both Weakness of Arms/Hands: None  Permission Sought/Granted Permission sought to share information with : Case Manager, Family Supports Permission granted to share information with : Yes, Verbal Permission Granted     Permission granted to share info w AGENCY: [facility]-Skilled Nursing Facility (Rehab)        Emotional Assessment Appearance:: Appears stated age Attitude/Demeanor/Rapport: Engaged Affect (typically observed): Calm, Pleasant Orientation: : Oriented to Self, Oriented to Place, Oriented to Situation Alcohol / Substance Use: Not Applicable Psych Involvement: No (comment)  Admission diagnosis:  degenerative joint disease left knee Patient Active Problem List   Diagnosis Date Noted  . Osteoarthritis of left knee 08/25/2019  . Essential  hypertension 12/06/2018  . Diabetes mellitus due to underlying condition with unspecified complications ([place]) 123456  . Mixed dyslipidemia 12/06/2018  . Status post lumbar spine operative procedure for decompression  of spinal cord 12/17/2017  . Pain in left knee 11/20/2017  . Degenerative spondylolisthesis 11/18/2017  . Degeneration of lumbar intervertebral disc 10/30/2017  . Spinal stenosis of lumbar region 10/30/2017  . Risk for falls 06/03/2017  . Hypoxia 02/07/2017  . SVT (supraventricular tachycardia) ([place]) 02/04/2017  . HLD (hyperlipidemia) 02/04/2017  . Depression 02/04/2017  . Chronic back pain 02/04/2017  . Acute respiratory failure with hypoxia ([place]) 02/04/2017  . Hypothyroidism   . GERD (gastroesophageal reflux disease)   . Anxiety   . Memory loss of 01/27/2017  . Umbilical hernia [id number]  . Hypertrophy of nasal turbinates 09/15/2013  . Type II diabetes mellitus ([place]) 09/15/2013  . Arthropathy 04/13/2013  . Hypertension, benign 04/13/2013  . Restless legs syndrome 04/13/2013  . Family history of breast cancer 02/18/2013  . Family history of malignant neoplasm of female breast 02/18/2013  . Cancer of lower-inner quadrant of left female breast ([place]) 10/18/2012  . Coronary artery disease 10/07/2003   PCP:  Javier Glazier, MD Pharmacy:   [facility] - [address], [place] [address] Phone: [phone] Fax: [phone]     Social Determinants of Health (SDOH) Interventions    Readmission Risk Interventions No flowsheet data found.

## 2019-08-27 DIAGNOSIS — R262 Difficulty in walking, not elsewhere classified: Secondary | ICD-10-CM

## 2019-08-27 DIAGNOSIS — K5790 Diverticulosis of intestine, part unspecified, without perforation or abscess without bleeding: Secondary | ICD-10-CM | POA: Insufficient documentation

## 2019-08-27 DIAGNOSIS — I440 Atrioventricular block, first degree: Secondary | ICD-10-CM

## 2019-08-27 HISTORY — DX: Atrioventricular block, first degree: I44.0

## 2019-08-27 HISTORY — DX: Diverticulosis of intestine, part unspecified, without perforation or abscess without bleeding: K57.90

## 2019-08-27 HISTORY — DX: Difficulty in walking, not elsewhere classified: R26.2

## 2019-08-27 LAB — GLUCOSE, CAPILLARY: Glucose-Capillary: 132 mg/dL — ABNORMAL HIGH (ref 70–99)

## 2019-08-27 LAB — CBC
HCT: 28.5 % — ABNORMAL LOW (ref 36.0–46.0)
Hemoglobin: 9 g/dL — ABNORMAL LOW (ref 12.0–15.0)
MCH: 32 pg (ref 26.0–34.0)
MCHC: 31.6 g/dL (ref 30.0–36.0)
MCV: 101.4 fL — ABNORMAL HIGH (ref 80.0–100.0)
Platelets: 234 10*3/uL (ref 150–400)
RBC: 2.81 MIL/uL — ABNORMAL LOW (ref 3.87–5.11)
RDW: 14.4 % (ref 11.5–15.5)
WBC: 8.5 10*3/uL (ref 4.0–10.5)
nRBC: 0 % (ref 0.0–0.2)

## 2019-08-27 LAB — SARS CORONAVIRUS 2 (TAT 6-24 HRS): SARS Coronavirus 2: NEGATIVE

## 2019-08-27 NOTE — Progress Notes (Signed)
    Subjective:  Patient reports pain as mild to moderate.  Denies N/V/CP/SOB.  Up eating breakfast.  Denies shortness of breath..  Objective:   VITALS:   Vitals:   08/26/19 0550 08/26/19 0952 08/26/19 2248 08/27/19 0529  BP: (!) 117/54 (!) 112/48 (!) 154/61 (!) 161/79  Pulse: 62 62 74 88  Resp: 15  18 16   Temp: 98.8 F (37.1 C)  98.8 F (37.1 C) 98.6 F (37 C)  TempSrc: Oral     SpO2: 98% (!) 89% (!) 84% 95%  Weight:      Height:        NAD, alert, cooperative ABD soft Sensation intact distally Intact pulses distally Dorsiflexion/Plantar flexion intact Incision: dressing C/D/I Compartment soft   Lab Results  Component Value Date   WBC 8.5 08/27/2019   HGB 9.0 (L) 08/27/2019   HCT 28.5 (L) 08/27/2019   MCV 101.4 (H) 08/27/2019   PLT 234 08/27/2019   BMET    Component Value Date/Time   NA 138 08/26/2019 0231   NA 141 06/29/2019 1158   NA 142 01/23/2015 1017   K 4.4 08/26/2019 0231   K 4.8 01/23/2015 1017   CL 104 08/26/2019 0231   CL 105 02/21/2013 0905   CO2 26 08/26/2019 0231   CO2 27 01/23/2015 1017   GLUCOSE 114 (H) 08/26/2019 0231   GLUCOSE 118 01/23/2015 1017   GLUCOSE 126 (H) 02/21/2013 0905   BUN 19 08/26/2019 0231   BUN 20 06/29/2019 1158   BUN 25.0 01/23/2015 1017   CREATININE 0.95 08/26/2019 0231   CREATININE 1.1 01/23/2015 1017   CALCIUM 8.1 (L) 08/26/2019 0231   CALCIUM 9.2 01/23/2015 1017   GFRNONAA 55 (L) 08/26/2019 0231   GFRAA >60 08/26/2019 0231     Assessment/Plan: 2 Days Post-Op   Principal Problem:   Osteoarthritis of left knee   WBAT with walker DVT ppx: Aspirin, SCDs, TEDS PO pain control: judicious use of narcotics, avoid IV PT/OT Hypoxia / O2 requirement: On room air she is saturating at 95%.  Plan for DC back to [facility] today after her negative Covid test which was resulted yesterday.   OPPT to start Tuesday 11/24 4PM at [facility] 08/27/2019, 9:38 AM   [phone]  [place] is now [facility]  [place] [address], [place], [place], [place] [address] Phone: [phone] [email or web address] Facebook  [employer]

## 2019-08-27 NOTE — Progress Notes (Signed)
Report provided to the receiving nurse at [facility]. Nurse reported no further questions. Waiting on PTAR to arrive for transport.

## 2019-08-27 NOTE — Progress Notes (Signed)
Attempted to call report to [employer]. Left a voicemail. Will try to call again.

## 2019-08-27 NOTE — Progress Notes (Signed)
Physical Therapy Treatment Patient Details Name: Wendy Roth MRN: [id number] DOB: 10-Nov-1931 Today's Date: 08/27/2019    History of Present Illness L TKA    PT Comments    Assisted pt with TKA HEP. MOd assist for supine to sit, pt sat at edge of bed x 6 minutes. Did not attempt ambulation as pt's transportation is about to arrive. Pt had 2/4 dyspnea with supine to sit, SaO2 95% on room air after 1 minute of seated rest.    Follow Up Recommendations  Follow surgeon's recommendation for DC plan and follow-up therapies     Equipment Recommendations  Other (comment)    Recommendations for Other Services       Precautions / Restrictions Precautions Precautions: Fall;Knee Restrictions Weight Bearing Restrictions: No LLE Weight Bearing: Weight bearing as tolerated    Mobility  Bed Mobility Overal bed mobility: Needs Assistance Bed Mobility: Supine to Sit     Supine to sit: Mod assist     General bed mobility comments: assist to raise trunk; pt sat edge of bed x 6 minutes; 2/4 dyspnea with supine to sit, SaO2 95% on room air  Transfers                 General transfer comment: NT- pt about to DC to SNF, transportation about to arrive  Ambulation/Gait                 Stairs             Wheelchair Mobility    Modified Rankin (Stroke Patients Only)       Balance Overall balance assessment: Needs assistance Sitting-balance support: Bilateral upper extremity supported;Feet supported Sitting balance-Leahy Scale: Fair                                      Cognition Arousal/Alertness: Awake/alert Behavior During Therapy: WFL for tasks assessed/performed Overall Cognitive Status: Within Functional Limits for tasks assessed                                        Exercises Total Joint Exercises Ankle Circles/Pumps: AROM;Both;10 reps;Supine Quad Sets: AROM;Both;10 reps;Supine Short Arc Quad:  AAROM;Left;10 reps;Supine Heel Slides: AAROM;Left;10 reps;Supine Straight Leg Raises: AAROM;Left;5 reps;Supine Long Arc Quad: AAROM;Left;10 reps;Seated    General Comments        Pertinent Vitals/Pain Pain Assessment: Faces Faces Pain Scale: Hurts little more Pain Location: L knee Pain Descriptors / Indicators: Grimacing;Guarding Pain Intervention(s): Limited activity within patient's tolerance;Monitored during session;Repositioned    Home Living                      Prior Function            PT Goals (current goals can now be found in the care plan section) Acute Rehab PT Goals Patient Stated Goal: return to home (retirement community) and have outpt PT at office PT Goal Formulation: With patient Time For Goal Achievement: 09/08/19 Potential to Achieve Goals: Good Progress towards PT goals: Progressing toward goals    Frequency    7X/week      PT Plan Current plan remains appropriate    Co-evaluation              AM-PAC PT "6 Clicks" Mobility   Outcome Measure  Help needed  turning from your back to your side while in a flat bed without using bedrails?: A Little Help needed moving from lying on your back to sitting on the side of a flat bed without using bedrails?: A Lot Help needed moving to and from a bed to a chair (including a wheelchair)?: A Lot Help needed standing up from a chair using your arms (e.g., wheelchair or bedside chair)?: A Lot Help needed to walk in hospital room?: A Lot Help needed climbing 3-5 steps with a railing? : A Lot 6 Click Score: 13    End of Session Equipment Utilized During Treatment: Gait belt Activity Tolerance: Patient limited by fatigue Patient left: with call bell/phone within reach;in bed;with nursing/sitter in room Nurse Communication: Mobility status PT Visit Diagnosis: Other abnormalities of gait and mobility (R26.89);Unsteadiness on feet (R26.81);Muscle weakness (generalized) (M62.81)     Time:  [id number] PT Time Calculation (min) (ACUTE ONLY): 17 min  Charges:  $Therapeutic Exercise: 8-22 mins                    Blondell Reveal Kistler PT 08/27/2019  Acute Rehabilitation Services Pager [phone] Office [phone]

## 2019-08-27 NOTE — TOC Transition Note (Signed)
Transition of Care [facility]) - CM/SW Discharge Note   Patient Details  Name: Wendy Roth MRN: [id number] Date of Birth: [birth year]  Transition of Care [facility]) CM/SW Contact:  Wende Neighbors, LCSW Phone Number: 08/27/2019, 12:24 PM   Clinical Narrative:   Patient to discharge to [facility] via PTAR. CSW spoke to husband via phone and he is agreeable to discharge plan. RN to please call [phone] (rn#[phone]) for report.     Final next level of care: Skilled Nursing Facility Barriers to Discharge: No Barriers Identified   Patient Goals and CMS Choice Patient states their goals for this hospitalization and ongoing recovery are:: " I am going to rehab."   Choice offered to / list presented to : NA  Discharge Placement              Patient chooses bed at: [place] [facility] Patient to be transferred to facility by: ptar Name of family member notified: family Patient and family notified of of transfer: 08/27/19  Discharge Plan and Services In-house Referral: Clinical Social Work Discharge Planning Services: [employer] Consult Post Acute Care Choice: [facility]                               Social Determinants of Health (SDOH) Interventions     Readmission Risk Interventions No flowsheet data found.

## 2019-08-27 NOTE — Discharge Summary (Signed)
Patient ID: Wendy Roth MRN: [id number] DOB/AGE: 1931-10-24 85 y.o.  Admit date: 08/25/2019 Discharge date:   Primary Diagnosis:  Left knee arthritis Admission Diagnoses:  Past Medical History:  Diagnosis Date  . 1st degree AV block 12/06/2018   Noted on EKG  . Anxiety   . Arthritis   . Back pain   . Breast cancer ([place])   . Carpal tunnel syndrome   . Complication of anesthesia    Delirium post anesthesia  . Coronary artery disease 2005   stent placed  . Depression   . Diabetes mellitus without complication (HCC)    diet controlled.   . Diverticulosis   . Fatigue   . GERD (gastroesophageal reflux disease)    "years ago" but still takes Prilosec  . Glaucoma    left eye  . History of kidney stones   . Hypercholesteremia   . Hypertension   . Hypothyroidism   . Nasal turbinate hypertrophy   . Restless leg syndrome   . Seizures ([place])    as a baby  . SVT (supraventricular tachycardia) ([place])   . Umbilical hernia    Discharge Diagnoses:   Principal Problem:   Osteoarthritis of left knee  Estimated body mass index is 33.79 kg/m as calculated from the following:   Height as of this encounter: 5' (1.524 m).   Weight as of this encounter: 78.5 kg.  Procedure:  Procedure(s) (LRB): COMPUTER ASSISTED TOTAL KNEE ARTHROPLASTY (Left)   Consults: None  HPI: Wendy Roth was admitted for elective left total knee arthroplasty for end-stage osteoarthritis.  She was transferred to the floor following an uneventful operative procedure for routine perioperative care. Laboratory Data: Admission on 08/25/2019  Component Date Value Ref Range Status  . Glucose-Capillary 08/25/2019 111* 70 - 99 mg/dL Final  . Glucose-Capillary 08/25/2019 110* 70 - 99 mg/dL Final  . Glucose-Capillary 08/25/2019 127* 70 - 99 mg/dL Final  . Glucose-Capillary 08/25/2019 163* 70 - 99 mg/dL Final  . WBC 08/26/2019 8.4  4.0 - 10.5 K/uL Final  . RBC 08/26/2019 2.93* 3.87 - 5.11 MIL/uL Final  .  Hemoglobin 08/26/2019 9.3* 12.0 - 15.0 g/dL Final   Comment: REPEATED TO VERIFY DELTA CHECK NOTED    . HCT 08/26/2019 30.3* 36.0 - 46.0 % Final  . MCV 08/26/2019 103.4* 80.0 - 100.0 fL Final  . MCH 08/26/2019 31.7  26.0 - 34.0 pg Final  . MCHC 08/26/2019 30.7  30.0 - 36.0 g/dL Final  . RDW 08/26/2019 14.4  11.5 - 15.5 % Final  . Platelets 08/26/2019 208  150 - 400 K/uL Final  . nRBC 08/26/2019 0.0  0.0 - 0.2 % Final   Performed at [facility], [place] [address], [place], [place] [address]  . Sodium 08/26/2019 138  135 - 145 mmol/L Final  . Potassium 08/26/2019 4.4  3.5 - 5.1 mmol/L Final  . Chloride 08/26/2019 104  98 - 111 mmol/L Final  . CO2 08/26/2019 26  22 - 32 mmol/L Final  . Glucose, Bld 08/26/2019 114* 70 - 99 mg/dL Final  . BUN 08/26/2019 19  8 - 23 mg/dL Final  . Creatinine, Ser 08/26/2019 0.95  0.44 - 1.00 mg/dL Final  . Calcium 08/26/2019 8.1* 8.9 - 10.3 mg/dL Final  . GFR calc non Af Amer 08/26/2019 55* >60 mL/min Final  . GFR calc Af Amer 08/26/2019 >60  >60 mL/min Final  . Anion gap 08/26/2019 8  5 - 15 Final   Performed at [facility], [place] [address], [place], [place] [address]  . Glucose-Capillary 08/25/2019 122* 70 - 99 mg/dL Final  . Glucose-Capillary 08/26/2019 147* 70 - 99 mg/dL Final  . SARS Coronavirus 2 08/26/2019 NEGATIVE  NEGATIVE Final   Comment: (NOTE) SARS-CoV-2 target nucleic acids are NOT DETECTED. The SARS-CoV-2 RNA is generally detectable in upper and lower respiratory specimens during the acute phase of infection. Negative results do not preclude SARS-CoV-2 infection, do not rule out co-infections with other pathogens, and should not be used as the sole basis for treatment or other patient management decisions. Negative results must be combined with clinical observations, patient history, and epidemiological information. The expected result is Negative. Fact Sheet for Patients:  SugarRoll.be Fact Sheet for Healthcare Providers: https://www.woods-mathews.com/ This test is not yet approved or cleared by the [place] FDA and  has been authorized for detection and/or diagnosis of SARS-CoV-2 by FDA under an Emergency Use Authorization (EUA). This EUA will remain  in effect (meaning this test can be used) for the duration of the COVID-19 declaration under Section 56                          4(b)(1) of the Act, 21 U.S.C. section 360bbb-3(b)(1), unless the authorization is terminated or revoked sooner. Performed at [facility], [place] [address], [place], [place] [address]   . Glucose-Capillary 08/26/2019 149* 70 - 99 mg/dL Final  . Glucose-Capillary 08/26/2019 160* 70 - 99 mg/dL Final  . WBC 08/27/2019 8.5  4.0 - 10.5 K/uL Final  . RBC 08/27/2019 2.81* 3.87 - 5.11 MIL/uL Final  . Hemoglobin 08/27/2019 9.0* 12.0 - 15.0 g/dL Final  . HCT 08/27/2019 28.5* 36.0 - 46.0 % Final  . MCV 08/27/2019 101.4* 80.0 - 100.0 fL Final  . MCH 08/27/2019 32.0  26.0 - 34.0 pg Final  . MCHC 08/27/2019 31.6  30.0 - 36.0 g/dL Final  . RDW 08/27/2019 14.4  11.5 - 15.5 % Final  . Platelets 08/27/2019 234  150 - 400 K/uL Final  . nRBC 08/27/2019 0.0  0.0 - 0.2 % Final   Performed at [facility], [place] [address] [place], [place] [address]  . Glucose-Capillary 08/26/2019 121* 70 - 99 mg/dL Final  . Glucose-Capillary 08/27/2019 132* 70 - 99 mg/dL Final  Hospital Outpatient Visit on 08/24/2019  Component Date Value Ref Range Status  . WBC 08/24/2019 7.1  4.0 - 10.5 K/uL Final  . RBC 08/24/2019 4.16  3.87 - 5.11 MIL/uL Final  . Hemoglobin 08/24/2019 13.3  12.0 - 15.0 g/dL Final  . HCT 08/24/2019 42.0  36.0 - 46.0 % Final  . MCV 08/24/2019 101.0* 80.0 - 100.0 fL Final  . MCH 08/24/2019 32.0  26.0 - 34.0 pg Final  . MCHC 08/24/2019 31.7  30.0 - 36.0 g/dL Final  . RDW 08/24/2019 14.2  11.5 - 15.5 % Final  . Platelets  08/24/2019 303  150 - 400 K/uL Final  . nRBC 08/24/2019 0.0  0.0 - 0.2 % Final   Performed at [facility], [place] [address] [place], [place] [address]  . Sodium 08/24/2019 136  135 - 145 mmol/L Final  . Potassium 08/24/2019 3.8  3.5 - 5.1 mmol/L Final  . Chloride 08/24/2019 100  98 - 111 mmol/L Final  . CO2 08/24/2019 28  22 - 32 mmol/L Final  . Glucose, Bld 08/24/2019 104* 70 - 99 mg/dL Final  . BUN 08/24/2019 21  8 -  23 mg/dL Final  . Creatinine, Ser 08/24/2019 1.19* 0.44 - 1.00 mg/dL Final  . Calcium 08/24/2019 8.6* 8.9 - 10.3 mg/dL Final  . Total Protein 08/24/2019 6.9  6.5 - 8.1 g/dL Final  . Albumin 08/24/2019 4.2  3.5 - 5.0 g/dL Final  . AST 08/24/2019 19  15 - 41 U/L Final  . ALT 08/24/2019 16  0 - 44 U/L Final  . Alkaline Phosphatase 08/24/2019 69  38 - 126 U/L Final  . Total Bilirubin 08/24/2019 0.7  0.3 - 1.2 mg/dL Final  . GFR calc non Af Amer 08/24/2019 42* >60 mL/min Final  . GFR calc Af Amer 08/24/2019 48* >60 mL/min Final  . Anion gap 08/24/2019 8  5 - 15 Final   Performed at [facility], [place] [address] [place], [place] [address]  . Prothrombin Time 08/24/2019 13.1  11.4 - 15.2 seconds Final  . INR 08/24/2019 1.0  0.8 - 1.2 Final   Comment: (NOTE) INR goal varies based on device and disease states. Performed at [facility], [place] [address], [place], [place] [address]   . ABO/RH(D) 08/24/2019 A NEG   Final  . Antibody Screen 08/24/2019 NEG   Final  . Sample Expiration 08/24/2019 08/28/2019,2359   Final  . Extend sample reason 08/24/2019    Final                   Value:NO TRANSFUSIONS OR PREGNANCY IN THE PAST 3 MONTHS Performed at [place] [address] [place], [place] [address]   . Color, Urine 08/24/2019 YELLOW  YELLOW Final  . APPearance 08/24/2019 CLEAR  CLEAR Final  . Specific Gravity, Urine 08/24/2019 1.012  1.005 - 1.030 Final  . pH 08/24/2019 6.0  5.0 - 8.0 Final  .  Glucose, UA 08/24/2019 NEGATIVE  NEGATIVE mg/dL Final  . Hgb urine dipstick 08/24/2019 SMALL* NEGATIVE Final  . Bilirubin Urine 08/24/2019 NEGATIVE  NEGATIVE Final  . Ketones, ur 08/24/2019 NEGATIVE  NEGATIVE mg/dL Final  . Protein, ur 08/24/2019 NEGATIVE  NEGATIVE mg/dL Final  . Nitrite 08/24/2019 NEGATIVE  NEGATIVE Final  . [place] Guest 08/24/2019 NEGATIVE  NEGATIVE Final  . RBC / HPF 08/24/2019 0-5  0 - 5 RBC/hpf Final  . WBC, UA 08/24/2019 0-5  0 - 5 WBC/hpf Final  . Bacteria, UA 08/24/2019 RARE* NONE SEEN Final  . Squamous Epithelial / LPF 08/24/2019 0-5  0 - 5 Final  . Mucus 08/24/2019 PRESENT   Final   Performed at [facility], [place] [address], [place], [place] [address]  . MRSA, PCR 08/24/2019 NEGATIVE  NEGATIVE Final  . Staphylococcus aureus 08/24/2019 NEGATIVE  NEGATIVE Final   Comment: (NOTE) The Xpert SA Assay (FDA approved for NASAL specimens in patients 68 years of age and older), is one component of a comprehensive surveillance program. It is not intended to diagnose infection nor to guide or monitor treatment. Performed at [facility], [place] [address], [place], [place] [address]   . Hgb A1c MFr Bld 08/24/2019 6.0* 4.8 - 5.6 % Final   Comment: (NOTE) Pre diabetes:          5.7%-6.4% Diabetes:              >6.4% Glycemic control for   <7.0% adults with diabetes   . Mean Plasma Glucose 08/24/2019 125.5  mg/dL Final   Performed at [place] [address], [place], [place] [address]  . ABO/RH(D) 08/24/2019  Final                   Value:A NEG Performed at [facility], [place] [address], [place], [place] [address]   Hospital Outpatient Visit on 08/23/2019  Component Date Value Ref Range Status  . SARS Coronavirus 2 08/23/2019 NEGATIVE  NEGATIVE Final   Comment: (NOTE) SARS-CoV-2 target nucleic acids are NOT DETECTED. The SARS-CoV-2 RNA is generally detectable in upper and lower respiratory  specimens during the acute phase of infection. Negative results do not preclude SARS-CoV-2 infection, do not rule out co-infections with other pathogens, and should not be used as the sole basis for treatment or other patient management decisions. Negative results must be combined with clinical observations, patient history, and epidemiological information. The expected result is Negative. Fact Sheet for Patients: SugarRoll.be Fact Sheet for Healthcare Providers: https://www.woods-mathews.com/ This test is not yet approved or cleared by the [place] FDA and  has been authorized for detection and/or diagnosis of SARS-CoV-2 by FDA under an Emergency Use Authorization (EUA). This EUA will remain  in effect (meaning this test can be used) for the duration of the COVID-19 declaration under Section 56                          4(b)(1) of the Act, 21 U.S.C. section 360bbb-3(b)(1), unless the authorization is terminated or revoked sooner. Performed at [facility], [place] [address], [place], [place] [address]      X-Rays:Dg Knee Left Port Pacu  Result Date: 08/25/2019 CLINICAL DATA:  Osteoarthritis of the left knee. Status post total knee arthroplasty. EXAM: PORTABLE LEFT KNEE - 1-2 VIEW COMPARISON:  None. FINDINGS: The components of the total knee prosthesis appear in excellent position. No fractures. Expected postsurgical air in fluid in the joint. IMPRESSION: Satisfactory appearance of the left knee after total knee replacement. Electronically Signed   By: Lorriane Shire M.D.   On: 08/25/2019 10:38    EKG: Orders placed or performed in visit on 12/06/18  . EKG 12-Lead     Hospital Course: Wendy Roth is a 85 y.o. who was admitted to Hospital. They were brought to the operating room on 08/25/2019 and underwent Procedure(s): [place].  Patient tolerated the procedure well and was later transferred to  the recovery room and then to the orthopaedic floor for postoperative care.  They were given PO and IV analgesics for pain control following their surgery.  They were given 24 hours of postoperative antibiotics of  Anti-infectives (From admission, onward)   Start     Dose/Rate Route Frequency Ordered Stop   08/25/19 1330  ceFAZolin (ANCEF) IVPB 2g/100 mL premix     2 g 200 mL/hr over 30 Minutes Intravenous Every 6 hours 08/25/19 1136 08/25/19 2102   08/25/19 0600  ceFAZolin (ANCEF) IVPB 2g/100 mL premix     2 g 200 mL/hr over 30 Minutes Intravenous On call to O.R. 08/25/19 [id number] 08/25/19 0739     and started on DVT prophylaxis in the form of Aspirin.   PT and OT were ordered for total joint protocol.  Discharge planning consulted to help with postop disposition and equipment needs.  Patient had a Good night on the evening of surgery.  They started to get up OOB with therapy on day one.  Continued to work with therapy into day two.  Dressing was changed on day two and the incision was c/d/i. She  did have some hypoxia with shortness of breath on the floor.  This resolved with nasal cannula and pulmonary toilet. By day two, the patient had progressed with therapy and meeting their goals.  Incision was healing well.  Patient was seen in rounds and was ready to go home.   Diet: Regular diet Activity:WBAT Follow-up:in 2 weeks Disposition - Nursing Home Discharged Condition: good   Discharge Instructions    Call MD / Call 911   Complete by: As directed    If you experience chest pain or shortness of breath, CALL 911 and be transported to the hospital emergency room.  If you develope a fever above 101 F, pus (white drainage) or increased drainage or redness at the wound, or calf pain, call your surgeon's office.   Constipation Prevention   Complete by: As directed    Drink plenty of fluids.  Prune juice may be helpful.  You may use a stool softener, such as Colace (over the counter) 100 mg twice a  day.  Use MiraLax (over the counter) for constipation as needed.   Diet - low sodium heart healthy   Complete by: As directed    Increase activity slowly as tolerated   Complete by: As directed      Allergies as of 08/27/2019      Reactions   Cortisone Shortness Of Breath   She blames symptoms of SOB & palpitations on shot of cortisone.      Medication List    STOP taking these medications   acetaminophen 500 MG tablet Commonly known as: TYLENOL     TAKE these medications   aspirin 81 MG chewable tablet Chew 1 tablet (81 mg total) by mouth 2 (two) times daily.   atorvastatin 10 MG tablet Commonly known as: LIPITOR Take 10 mg by mouth at bedtime.   clonazePAM 1 MG tablet Commonly known as: KLONOPIN Take 1 mg by mouth 2 (two) times daily. What changed: Another medication with the same name was removed. Continue taking this medication, and follow the directions you see here.   diltiazem 120 MG 24 hr capsule Commonly known as: Cardizem CD Take 1 capsule (120 mg total) by mouth daily. What changed: when to take this   docusate sodium 100 MG capsule Commonly known as: COLACE Take 1 capsule (100 mg total) by mouth 2 (two) times daily.   donepezil 5 MG tablet Commonly known as: ARICEPT Take 5 mg by mouth every evening.   gabapentin 300 MG capsule Commonly known as: NEURONTIN Take 300 mg by mouth 3 (three) times daily.   HYDROcodone-acetaminophen 5-325 MG tablet Commonly known as: NORCO/VICODIN Take 1-2 tablets by mouth every 4 (four) hours as needed for moderate pain (pain score 4-6). What changed:   how much to take  when to take this  reasons to take this   hydroxypropyl methylcellulose / hypromellose 2.5 % ophthalmic solution Commonly known as: ISOPTO TEARS / GONIOVISC Place 1 drop into both eyes 3 (three) times daily.   latanoprost 0.005 % ophthalmic solution Commonly known as: XALATAN Place 1 drop into both eyes at bedtime.   levothyroxine 75 MCG  tablet Commonly known as: SYNTHROID Take 75 mcg by mouth daily before breakfast.   lisinopril 20 MG tablet Commonly known as: ZESTRIL Take 20 mg by mouth daily.   magnesium oxide 400 (241.3 Mg) MG tablet Commonly known as: MAG-OX Take 1 tablet (400 mg total) by mouth daily.   metoprolol succinate 50 MG 24 hr tablet Commonly known  as: TOPROL-XL Take 50 mg by mouth every evening. Take with or immediately following a meal.   omeprazole 40 MG capsule Commonly known as: PRILOSEC Take 40 mg by mouth daily.   ondansetron 4 MG tablet Commonly known as: ZOFRAN Take 1 tablet (4 mg total) by mouth every 6 (six) hours as needed for nausea.   polyethylene glycol 17 g packet Commonly known as: MIRALAX / GLYCOLAX Take 17 g by mouth daily as needed for mild constipation.   primidone 50 MG tablet Commonly known as: MYSOLINE Take 50 mg by mouth 4 (four) times daily.   RisperDAL 0.25 MG tablet Generic drug: risperiDONE Take 0.125 mg by mouth 2 (two) times daily.   rOPINIRole 0.25 MG tablet Commonly known as: REQUIP Take 0.5 mg by mouth at bedtime.   senna 8.6 MG Tabs tablet Commonly known as: SENOKOT Take 1 tablet (8.6 mg total) by mouth 2 (two) times daily.   Vitamin D (Ergocalciferol) 1.25 MG (50000 UT) Caps capsule Commonly known as: DRISDOL Take 50,000 Units by mouth once a week.   Wellbutrin XL 150 MG 24 hr tablet Generic drug: buPROPion Take 150 mg by mouth daily.   Zoloft 100 MG tablet Generic drug: sertraline Take 100 mg by mouth at bedtime.       Contact information for follow-up providers    Swinteck, Aaron Edelman, MD. Schedule an appointment as soon as possible for a visit in 2 weeks.   Specialty: Orthopedic Surgery Why: For wound re-check Contact information: [address] [place] [address] [id number]            Contact information for after-discharge care    Destination    [facility] .   Service: Skilled  Nursing Contact information: [address] [place] [phone]                  Signed: Geralynn Rile, MD Orthopaedic Surgery 08/27/2019, 11:33 AM

## 2019-09-09 DIAGNOSIS — K649 Unspecified hemorrhoids: Secondary | ICD-10-CM

## 2019-09-09 DIAGNOSIS — D649 Anemia, unspecified: Secondary | ICD-10-CM

## 2019-09-09 DIAGNOSIS — R198 Other specified symptoms and signs involving the digestive system and abdomen: Secondary | ICD-10-CM | POA: Insufficient documentation

## 2019-09-09 DIAGNOSIS — B9681 Helicobacter pylori [H. pylori] as the cause of diseases classified elsewhere: Secondary | ICD-10-CM

## 2019-09-09 DIAGNOSIS — Z98818 Other dental procedure status: Secondary | ICD-10-CM | POA: Insufficient documentation

## 2019-09-09 DIAGNOSIS — Z23 Encounter for immunization: Secondary | ICD-10-CM

## 2019-09-09 DIAGNOSIS — G3184 Mild cognitive impairment, so stated: Secondary | ICD-10-CM | POA: Insufficient documentation

## 2019-09-09 HISTORY — DX: Encounter for immunization: Z23

## 2019-09-09 HISTORY — DX: Other specified symptoms and signs involving the digestive system and abdomen: R19.8

## 2019-09-09 HISTORY — DX: Unspecified hemorrhoids: K64.9

## 2019-09-09 HISTORY — DX: Anemia, unspecified: D64.9

## 2019-09-09 HISTORY — DX: Helicobacter pylori (H. pylori) as the cause of diseases classified elsewhere: B96.81

## 2019-09-09 HISTORY — DX: Mild cognitive impairment of uncertain or unknown etiology: G31.84

## 2019-09-09 HISTORY — DX: Other dental procedure status: Z98.818

## 2020-01-30 ENCOUNTER — Other Ambulatory Visit: Payer: Self-pay

## 2020-01-30 ENCOUNTER — Other Ambulatory Visit: Payer: Self-pay | Admitting: Hematology and Oncology

## 2020-01-30 ENCOUNTER — Ambulatory Visit
Admission: RE | Admit: 2020-01-30 | Discharge: 2020-01-30 | Disposition: A | Discharge status: Home / Self Care | Payer: Medicare Other | Source: Ambulatory Visit | Attending: Hematology and Oncology | Admitting: Hematology and Oncology

## 2020-01-30 DIAGNOSIS — Z1231 Encounter for screening mammogram for malignant neoplasm of breast: Secondary | ICD-10-CM

## 2020-01-30 DIAGNOSIS — Z853 Personal history of malignant neoplasm of breast: Secondary | ICD-10-CM

## 2020-02-06 ENCOUNTER — Other Ambulatory Visit: Payer: Self-pay

## 2020-02-06 ENCOUNTER — Ambulatory Visit (INDEPENDENT_AMBULATORY_CARE_PROVIDER_SITE_OTHER): Payer: Medicare Other | Admitting: Cardiology

## 2020-02-06 ENCOUNTER — Encounter: Payer: Self-pay | Admitting: Cardiology

## 2020-02-06 VITALS — BP 118/70 | HR 54 | Ht 60.0 in | Wt 167.0 lb

## 2020-02-06 DIAGNOSIS — I2510 Atherosclerotic heart disease of native coronary artery without angina pectoris: Secondary | ICD-10-CM

## 2020-02-06 DIAGNOSIS — E088 Diabetes mellitus due to underlying condition with unspecified complications: Secondary | ICD-10-CM | POA: Diagnosis not present

## 2020-02-06 DIAGNOSIS — I10 Essential (primary) hypertension: Secondary | ICD-10-CM | POA: Diagnosis not present

## 2020-02-06 DIAGNOSIS — E782 Mixed hyperlipidemia: Secondary | ICD-10-CM

## 2020-02-06 MED ORDER — NITROGLYCERIN 0.4 MG SL SUBL
0.4000 mg | SUBLINGUAL_TABLET | SUBLINGUAL | 6 refills | Status: DC | PRN
Start: 2020-02-06 — End: 2021-01-30

## 2020-02-06 NOTE — Progress Notes (Signed)
Cardiology Office Note:    Date:  02/06/2020   ID:  Wendy Roth, DOB [birth year], MRN [id number]  PCP:  Javier Glazier, MD  Cardiologist:  Jenean Lindau, MD   Referring MD: Javier Glazier, MD    ASSESSMENT:    1. Coronary artery disease involving native coronary artery of native heart without angina pectoris   2. Essential hypertension   3. Mixed hyperlipidemia   4. Diabetes mellitus due to underlying condition with unspecified complications ([place])    PLAN:    In order of problems listed above:  1. Coronary artery disease: Secondary prevention stressed to the patient.  Importance of compliance with diet and medication stressed and she vocalized understanding.  I told her to ambulate age appropriately.  She uses a walker to ambulate. 2. Essential hypertension: Blood pressure is stable 3. Mixed dyslipidemia and diabetes mellitus: Diet was emphasized.  She has not had breakfast today so we will do complete blood work including fasting lipids 4. Sublingual nitroglycerin prescription was sent, its protocol and 911 protocol explained and the patient vocalized understanding questions were answered to the patient's satisfaction 5. Patient will be seen in follow-up appointment in 6 months or earlier if the patient has any concerns    Medication Adjustments/Labs and Tests Ordered: Current medicines are reviewed at length with the patient today.  Concerns regarding medicines are outlined above.  No orders of the defined types were placed in this encounter.  No orders of the defined types were placed in this encounter.    Chief Complaint  Patient presents with  . Follow-up     History of Present Illness:    Wendy Roth is a 86 y.o. female.  Patient has past medical history of coronary artery disease, essential hypertension dyslipidemia and diabetes mellitus.  She denies any problems at this time and takes care of activities of daily living.  No chest pain  orthopnea or PND.  At the time of my evaluation, the patient is alert awake oriented and in no distress.  Past Medical History:  Diagnosis Date  . 1st degree AV block 12/06/2018   Noted on EKG  . Anxiety   . Arthritis   . Back pain   . Breast cancer ([place]) 2002   left breast  . Breast cancer ([place]) 2014   right breast  . Carpal tunnel syndrome   . Complication of anesthesia    Delirium post anesthesia  . Coronary artery disease 2005   stent placed  . Depression   . Diabetes mellitus without complication (HCC)    diet controlled.   . Diverticulosis   . Fatigue   . GERD (gastroesophageal reflux disease)    "years ago" but still takes Prilosec  . Glaucoma    left eye  . History of kidney stones   . Hypercholesteremia   . Hypertension   . Hypothyroidism   . Nasal turbinate hypertrophy   . Restless leg syndrome   . Seizures ([place])    as a baby  . SVT (supraventricular tachycardia) ([place])   . Umbilical hernia     Past Surgical History:  Procedure Laterality Date  . ABDOMINAL HYSTERECTOMY    . ANGIOPLASTY     x2  . BREAST LUMPECTOMY Left 2002  . BREAST LUMPECTOMY Right 2014  . BREAST LUMPECTOMY WITH NEEDLE LOCALIZATION Right 07/05/2013   Procedure: RIGHT BREAST WIRE GUIDED LUMPECTOMY ;  Surgeon: Rolm Bookbinder, MD;  Location: [facility];  Service: General;  Laterality: Right;  .  CARPAL TUNNEL RELEASE     bilateral  . COLONOSCOPY    . CORONARY ANGIOPLASTY  2005   stent  . EYE SURGERY Bilateral    cataracts  . HERNIA REPAIR    . INSERTION OF MESH N/A 06/11/2016   Procedure: INSERTION OF MESH;  Surgeon: Rolm Bookbinder, MD;  Location: [facility];  Service: General;  Laterality: N/A;  . KNEE ARTHROPLASTY Left 08/25/2019   Procedure: COMPUTER ASSISTED TOTAL KNEE ARTHROPLASTY;  Surgeon: Rod Can, MD;  Location: [facility];  Service: Orthopedics;  Laterality: Left;  . LAPAROSCOPIC INCISIONAL / UMBILICAL / VENTRAL HERNIA REPAIR  06/11/2016   UHR w/mesh  . LUMBAR  LAMINECTOMY/DECOMPRESSION MICRODISCECTOMY N/A 12/17/2017   Procedure: LUMBAR L3-S1 DECOMPRESSION WITH IN SITU FUSION;  Surgeon: Melina Schools, MD;  Location: [facility];  Service: Orthopedics;  Laterality: N/A;  . MENISCUS REPAIR     left  . PARATHYROIDECTOMY    . RE-EXCISION OF BREAST CANCER,SUPERIOR MARGINS Right 08/01/2013   Procedure: RE-EXCISION OF BREAST CANCER MARGIN;  Surgeon: Rolm Bookbinder, MD;  Location: [facility];  Service: General;  Laterality: Right;  . TONSILLECTOMY    . UMBILICAL HERNIA REPAIR N/A 06/11/2016   Procedure: LAPAROSCOPIC UMBILICAL HERNIA;  Surgeon: Rolm Bookbinder, MD;  Location: [facility];  Service: General;  Laterality: N/A;    Current Medications: Current Meds  Medication Sig  . atorvastatin (LIPITOR) 10 MG tablet Take 10 mg by mouth at bedtime.   Marland Kitchen buPROPion (WELLBUTRIN XL) 150 MG 24 hr tablet Take 150 mg by mouth daily.   . Carboxymethylcellulose Sodium (REFRESH TEARS OP) USE TWICE DAILY IN BOTH EYES  . clonazePAM (KLONOPIN) 1 MG tablet Take 1 mg by mouth 2 (two) times daily.   Marland Kitchen diltiazem (CARDIZEM CD) 120 MG 24 hr capsule Take 1 capsule (120 mg total) by mouth daily. (Patient taking differently: Take 120 mg by mouth every evening. )  . DOK 100 MG capsule SMARTSIG:1 Capsule(s) By Mouth Twice Daily  . donepezil (ARICEPT) 5 MG tablet Take 5 mg by mouth every evening.  . gabapentin (NEURONTIN) 300 MG capsule Take 300 mg by mouth 3 (three) times daily.  Marland Kitchen HYDROcodone-acetaminophen (NORCO/VICODIN) 5-325 MG tablet Take 1-2 tablets by mouth every 4 (four) hours as needed for moderate pain (pain score 4-6).  . hydroxypropyl methylcellulose / hypromellose (ISOPTO TEARS / GONIOVISC) 2.5 % ophthalmic solution Place 1 drop into both eyes 3 (three) times daily.   Marland Kitchen LACTAID 3000 units tablet TAKE ONE TABLET BY MOUTH DAILY AS NEEDED  . latanoprost (XALATAN) 0.005 % ophthalmic solution Place 1 drop into both eyes at bedtime.  Marland Kitchen levothyroxine (SYNTHROID, LEVOTHROID) 75 MCG tablet  Take 75 mcg by mouth daily before breakfast.  . lisinopril (PRINIVIL,ZESTRIL) 20 MG tablet Take 20 mg by mouth daily.  . magnesium oxide (MAG-OX) 400 (241.3 Mg) MG tablet Take 1 tablet (400 mg total) by mouth daily.  . metoprolol succinate (TOPROL-XL) 50 MG 24 hr tablet Take 50 mg by mouth every evening. Take with or immediately following a meal.   . omeprazole (PRILOSEC) 40 MG capsule Take 40 mg by mouth daily.  . ondansetron (ZOFRAN) 4 MG tablet Take 1 tablet (4 mg total) by mouth every 6 (six) hours as needed for nausea.  . polyethylene glycol (MIRALAX / GLYCOLAX) 17 g packet Take 17 g by mouth daily as needed for mild constipation.   . primidone (MYSOLINE) 50 MG tablet Take 50 mg by mouth 4 (four) times daily.   . risperiDONE (RISPERDAL) 0.25 MG  tablet Take 0.125 mg by mouth 2 (two) times daily.   Marland Kitchen rOPINIRole (REQUIP) 0.25 MG tablet Take 0.5 mg by mouth at bedtime.  . sertraline (ZOLOFT) 100 MG tablet Take 100 mg by mouth at bedtime.   . Vitamin D, Ergocalciferol, (DRISDOL) 1.25 MG (50000 UT) CAPS capsule Take 50,000 Units by mouth once a week.     Allergies:   Cortisone   Social History   Socioeconomic History  . Marital status: Married    Spouse name: Not on file  . Number of children: Not on file  . Years of education: Not on file  . Highest education level: Not on file  Occupational History  . Not on file  Tobacco Use  . Smoking status: Never Smoker  . Smokeless tobacco: Never Used  Substance and Sexual Activity  . Alcohol use: Yes    Comment: socially  . Drug use: No  . Sexual activity: Not Currently  Other Topics Concern  . Not on file  Social History Narrative  . Not on file   Social Determinants of Health   Financial Resource Strain:   . Difficulty of Paying Living Expenses:   Food Insecurity:   . Worried About Charity fundraiser in the Last Year:   . Arboriculturist in the Last Year:   Transportation Needs:   . Film/video editor (Medical):   Marland Kitchen Lack  of Transportation (Non-Medical):   Physical Activity:   . Days of Exercise per Week:   . Minutes of Exercise per Session:   Stress:   . Feeling of Stress :   Social Connections:   . Frequency of Communication with Friends and Family:   . Frequency of Social Gatherings with Friends and Family:   . Attends Religious Services:   . Active Member of Clubs or Organizations:   . Attends Archivist Meetings:   Marland Kitchen Marital Status:      Family History: The patient's family history includes Breast cancer (age of onset: 71) in her maternal aunt; Cancer in her sister; Cancer (age of onset: 22) in her father; Cancer (age of onset: 26) in her mother; Cancer (age of onset: 22) in her maternal grandmother.  ROS:   Please see the history of present illness.    All other systems reviewed and are negative.  EKGs/Labs/Other Studies Reviewed:    The following studies were reviewed today: EKG reveals sinus rhythm and nonspecific ST-T changes.   Recent Labs: 06/29/2019: TSH 4.010 08/24/2019: ALT 16 08/26/2019: BUN 19; Creatinine, Ser 0.95; Potassium 4.4; Sodium 138 08/27/2019: Hemoglobin 9.0; Platelets 234  Recent Lipid Panel    Component Value Date/Time   CHOL 134 06/29/2019 1158   TRIG 252 (H) 06/29/2019 1158   HDL 46 06/29/2019 1158   CHOLHDL 2.9 06/29/2019 1158   CHOLHDL 2.2 02/04/2017 0235   VLDL 14 02/04/2017 0235   LDLCALC 49 06/29/2019 1158    Physical Exam:    VS:  BP 118/70   Pulse (!) 54   Ht 5' (1.524 m)   Wt 167 lb (75.8 kg)   SpO2 95%   BMI 32.61 kg/m     Wt Readings from Last 3 Encounters:  02/06/20 167 lb (75.8 kg)  08/25/19 173 lb (78.5 kg)  08/24/19 173 lb (78.5 kg)     GEN: Patient is in no acute distress HEENT: Normal NECK: No JVD; No carotid bruits LYMPHATICS: No lymphadenopathy CARDIAC: Hear sounds regular, 2/6 systolic murmur at the apex. RESPIRATORY:  Clear to auscultation without rales, wheezing or rhonchi  ABDOMEN: Soft, non-tender,  non-distended MUSCULOSKELETAL:  No edema; No deformity  SKIN: Warm and dry NEUROLOGIC:  Alert and oriented x 3 PSYCHIATRIC:  Normal affect   Signed, Jenean Lindau, MD  02/06/2020 11:08 AM    [place]

## 2020-02-06 NOTE — Patient Instructions (Addendum)
Medication Instructions:  Your physician has recommended you make the following change in your medication:   Take nitroglycerin as needed for chest pain.  *If you need a refill on your cardiac medications before your next appointment, please call your pharmacy*   Lab Work: Your physician recommends that you have a BMET, CBC, TSH, LFT and Lipids today in the office.  If you have labs (blood work) drawn today and your tests are completely normal, you will receive your results only by: Marland Kitchen MyChart Message (if you have MyChart) OR . A paper copy in the mail If you have any lab test that is abnormal or we need to change your treatment, we will call you to review the results.   Testing/Procedures: None ordered   Follow-Up: At [facility], you and your health needs are our priority.  As part of our continuing mission to provide you with exceptional heart care, we have created designated Provider Care Teams.  These Care Teams include your primary Cardiologist (physician) and Advanced Practice Providers (APPs -  Physician Assistants and Nurse Practitioners) who all work together to provide you with the care you need, when you need it.  We recommend signing up for the patient portal called "MyChart".  Sign up information is provided on this After Visit Summary.  MyChart is used to connect with patients for Virtual Visits (Telemedicine).  Patients are able to view lab/test results, encounter notes, upcoming appointments, etc.  Non-urgent messages can be sent to your provider as well.   To learn more about what you can do with MyChart, go to [email or web address].    Your next appointment:   6 month(s)  The format for your next appointment:   In Person  Provider:   Jyl Heinz, MD   Other Instructions NA

## 2020-02-07 ENCOUNTER — Telehealth: Payer: Self-pay

## 2020-02-07 LAB — CBC WITH DIFFERENTIAL/PLATELET
Basophils Absolute: 0.1 10*3/uL (ref 0.0–0.2)
Basos: 1 %
EOS (ABSOLUTE): 0.2 10*3/uL (ref 0.0–0.4)
Eos: 2 %
Hematocrit: 32.9 % — ABNORMAL LOW (ref 34.0–46.6)
Hemoglobin: 10.9 g/dL — ABNORMAL LOW (ref 11.1–15.9)
Immature Grans (Abs): 0 10*3/uL (ref 0.0–0.1)
Immature Granulocytes: 0 %
Lymphocytes Absolute: 2.7 10*3/uL (ref 0.7–3.1)
Lymphs: 41 %
MCH: 30.1 pg (ref 26.6–33.0)
MCHC: 33.1 g/dL (ref 31.5–35.7)
MCV: 91 fL (ref 79–97)
Monocytes Absolute: 0.7 10*3/uL (ref 0.1–0.9)
Monocytes: 10 %
Neutrophils Absolute: 3 10*3/uL (ref 1.4–7.0)
Neutrophils: 46 %
Platelets: 291 10*3/uL (ref 150–450)
RBC: 3.62 x10E6/uL — ABNORMAL LOW (ref 3.77–5.28)
RDW: 15.8 % — ABNORMAL HIGH (ref 11.7–15.4)
WBC: 6.6 10*3/uL (ref 3.4–10.8)

## 2020-02-07 LAB — HEPATIC FUNCTION PANEL
ALT: 5 IU/L (ref 0–32)
AST: 11 IU/L (ref 0–40)
Albumin: 4.2 g/dL (ref 3.6–4.6)
Alkaline Phosphatase: 73 IU/L (ref 39–117)
Bilirubin Total: 0.3 mg/dL (ref 0.0–1.2)
Bilirubin, Direct: 0.13 mg/dL (ref 0.00–0.40)
Total Protein: 6.4 g/dL (ref 6.0–8.5)

## 2020-02-07 LAB — BASIC METABOLIC PANEL
BUN/Creatinine Ratio: 18 (ref 12–28)
BUN: 21 mg/dL (ref 8–27)
CO2: 26 mmol/L (ref 20–29)
Calcium: 8.8 mg/dL (ref 8.7–10.3)
Chloride: 105 mmol/L (ref 96–106)
Creatinine, Ser: 1.18 mg/dL — ABNORMAL HIGH (ref 0.57–1.00)
GFR calc Af Amer: 49 mL/min/{1.73_m2} — ABNORMAL LOW (ref >59)
GFR calc non Af Amer: 42 mL/min/{1.73_m2} — ABNORMAL LOW (ref >59)
Glucose: 93 mg/dL (ref 65–99)
Potassium: 5.1 mmol/L (ref 3.5–5.2)
Sodium: 145 mmol/L — ABNORMAL HIGH (ref 134–144)

## 2020-02-07 LAB — LIPID PANEL
Chol/HDL Ratio: 2.3 ratio (ref 0.0–4.4)
Cholesterol, Total: 145 mg/dL (ref 100–199)
HDL: 64 mg/dL (ref >39)
LDL Chol Calc (NIH): 62 mg/dL (ref 0–99)
Triglycerides: 103 mg/dL (ref 0–149)
VLDL Cholesterol Cal: 19 mg/dL (ref 5–40)

## 2020-02-07 LAB — TSH: TSH: 3.19 u[IU]/mL (ref 0.450–4.500)

## 2020-02-07 NOTE — Telephone Encounter (Signed)
Spoke with patient regarding results and recommendation.  Patient verbalizes understanding and is agreeable to plan of care. Advised patient to call back with any issues or concerns.  

## 2020-02-07 NOTE — Telephone Encounter (Signed)
-----   Message from Jenean Lindau, MD sent at 02/07/2020  8:16 AM EDT ----- Stable.  Please send copy to primary care.  The results of the study is unremarkable. Please inform patient. I will discuss in detail at next appointment. Cc  primary care/referring physician Jenean Lindau, MD 02/07/2020 8:15 AM

## 2020-02-16 DIAGNOSIS — Z96659 Presence of unspecified artificial knee joint: Secondary | ICD-10-CM | POA: Insufficient documentation

## 2020-02-16 HISTORY — DX: Presence of unspecified artificial knee joint: Z96.659

## 2020-07-06 DIAGNOSIS — F32A Depression, unspecified: Secondary | ICD-10-CM

## 2020-07-06 HISTORY — DX: Depression, unspecified: F32.A

## 2020-09-13 ENCOUNTER — Telehealth: Payer: Self-pay | Admitting: Cardiology

## 2020-09-13 NOTE — Telephone Encounter (Signed)
Called back but no answer or AM.

## 2020-09-13 NOTE — Telephone Encounter (Signed)
I don't think so

## 2020-09-13 NOTE — Telephone Encounter (Signed)
Pt called In and stated that she my have to have some dental work done because she has not been to her dentist in a while.   She would like to know if she would need an antibiotic before?     Best number  [phone]

## 2020-10-26 ENCOUNTER — Telehealth: Payer: Self-pay | Admitting: Cardiology

## 2020-10-26 NOTE — Telephone Encounter (Signed)
Patient is having some dental work done (tooth extraction and other dental work). Advised the patient to have the dentist office call us directly. Patient sees Dr. Elder Cyphers DDS at  [phone] if we need to contact them

## 2020-11-13 NOTE — Telephone Encounter (Signed)
Patient called again asking about clearance for dental work. Advised patient again to contact dentist office and have the dentist office contact us

## 2020-11-15 ENCOUNTER — Telehealth: Payer: Self-pay | Admitting: Cardiology

## 2020-11-15 NOTE — Telephone Encounter (Signed)
Patient called to see if we can give her medical clearance from her dentist. Advised her that the dentist would have to contact us directly or send [place] a fax. Patient is frustrated with the process. Dentist told the patient that we need to contact them to get clearance

## 2020-11-15 NOTE — Telephone Encounter (Signed)
Called pt but no answer.

## 2020-11-19 NOTE — Telephone Encounter (Signed)
Pt advised to have dentist to send a clearance letter to the office and we will be happy to assist them with anything they need. Pt verbalized understanding and had no additional questions.

## 2020-11-20 NOTE — Telephone Encounter (Signed)
Pt's dentist office is calling to verify that the pt does not require antibotics prior to have a dental extraction or cleaning. I do not see any records of stent, CABG or other procedures. How do you advise?

## 2020-11-20 NOTE — Telephone Encounter (Signed)
Dr. Elza Rafter staff have been made aware of recommendations. Recommendation sent via Epic. No additional questions.

## 2020-11-20 NOTE — Telephone Encounter (Signed)
I do not see any reason for antibiotic.

## 2020-12-17 ENCOUNTER — Other Ambulatory Visit: Payer: Self-pay

## 2020-12-17 DIAGNOSIS — K5790 Diverticulosis of intestine, part unspecified, without perforation or abscess without bleeding: Secondary | ICD-10-CM | POA: Insufficient documentation

## 2020-12-17 DIAGNOSIS — I10 Essential (primary) hypertension: Secondary | ICD-10-CM | POA: Insufficient documentation

## 2020-12-17 DIAGNOSIS — G2581 Restless legs syndrome: Secondary | ICD-10-CM | POA: Insufficient documentation

## 2020-12-17 DIAGNOSIS — R569 Unspecified convulsions: Secondary | ICD-10-CM | POA: Insufficient documentation

## 2020-12-17 DIAGNOSIS — R5383 Other fatigue: Secondary | ICD-10-CM | POA: Insufficient documentation

## 2020-12-17 DIAGNOSIS — E7800 Pure hypercholesterolemia, unspecified: Secondary | ICD-10-CM | POA: Insufficient documentation

## 2020-12-17 DIAGNOSIS — M549 Dorsalgia, unspecified: Secondary | ICD-10-CM | POA: Insufficient documentation

## 2020-12-17 DIAGNOSIS — M1990 Unspecified osteoarthritis, unspecified site: Secondary | ICD-10-CM | POA: Insufficient documentation

## 2020-12-17 DIAGNOSIS — J343 Hypertrophy of nasal turbinates: Secondary | ICD-10-CM | POA: Insufficient documentation

## 2020-12-17 DIAGNOSIS — T8859XA Other complications of anesthesia, initial encounter: Secondary | ICD-10-CM | POA: Insufficient documentation

## 2020-12-17 DIAGNOSIS — H409 Unspecified glaucoma: Secondary | ICD-10-CM | POA: Insufficient documentation

## 2020-12-17 DIAGNOSIS — Z87442 Personal history of urinary calculi: Secondary | ICD-10-CM | POA: Insufficient documentation

## 2020-12-17 DIAGNOSIS — G5600 Carpal tunnel syndrome, unspecified upper limb: Secondary | ICD-10-CM | POA: Insufficient documentation

## 2020-12-17 DIAGNOSIS — E119 Type 2 diabetes mellitus without complications: Secondary | ICD-10-CM | POA: Insufficient documentation

## 2020-12-18 ENCOUNTER — Ambulatory Visit: Payer: Medicare Other | Admitting: Cardiology

## 2021-01-22 ENCOUNTER — Other Ambulatory Visit: Payer: Self-pay | Admitting: Hematology and Oncology

## 2021-01-22 DIAGNOSIS — Z1231 Encounter for screening mammogram for malignant neoplasm of breast: Secondary | ICD-10-CM

## 2021-01-30 DIAGNOSIS — K219 Gastro-esophageal reflux disease without esophagitis: Secondary | ICD-10-CM | POA: Insufficient documentation

## 2021-02-07 ENCOUNTER — Ambulatory Visit: Payer: Medicare Other | Admitting: Cardiology

## 2021-03-13 ENCOUNTER — Ambulatory Visit: Payer: Medicare Other

## 2021-03-20 ENCOUNTER — Ambulatory Visit
Admission: RE | Admit: 2021-03-20 | Discharge: 2021-03-20 | Disposition: A | Discharge status: Home / Self Care | Payer: Medicare Other | Source: Ambulatory Visit | Attending: Hematology and Oncology | Admitting: Hematology and Oncology

## 2021-03-20 ENCOUNTER — Other Ambulatory Visit: Payer: Self-pay

## 2021-03-20 DIAGNOSIS — Z1231 Encounter for screening mammogram for malignant neoplasm of breast: Secondary | ICD-10-CM

## 2021-04-01 ENCOUNTER — Other Ambulatory Visit: Payer: Self-pay

## 2021-04-01 ENCOUNTER — Ambulatory Visit (INDEPENDENT_AMBULATORY_CARE_PROVIDER_SITE_OTHER): Payer: Medicare Other | Admitting: Cardiology

## 2021-04-01 ENCOUNTER — Encounter: Payer: Self-pay | Admitting: Cardiology

## 2021-04-01 VITALS — BP 134/78 | HR 68 | Ht 59.0 in | Wt 160.1 lb

## 2021-04-01 DIAGNOSIS — E782 Mixed hyperlipidemia: Secondary | ICD-10-CM

## 2021-04-01 DIAGNOSIS — I10 Essential (primary) hypertension: Secondary | ICD-10-CM

## 2021-04-01 DIAGNOSIS — I2510 Atherosclerotic heart disease of native coronary artery without angina pectoris: Secondary | ICD-10-CM

## 2021-04-01 DIAGNOSIS — E039 Hypothyroidism, unspecified: Secondary | ICD-10-CM

## 2021-04-01 NOTE — Patient Instructions (Signed)
Medication Instructions:  No medication changes. *If you need a refill on your cardiac medications before your next appointment, please call your pharmacy*   Lab Work: Your physician recommends that you have labs done in the office today. Your test included  basic metabolic panel, TSH, liver function and lipids.  If you have labs (blood work) drawn today and your tests are completely normal, you will receive your results only by: [place] (if you have MyChart) OR A paper copy in the mail If you have any lab test that is abnormal or we need to change your treatment, we will call you to review the results.   Testing/Procedures: None ordered   Follow-Up: At [facility], you and your health needs are our priority.  As part of our continuing mission to provide you with exceptional heart care, we have created designated Provider Care Teams.  These Care Teams include your primary Cardiologist (physician) and Advanced Practice Providers (APPs -  Physician Assistants and Nurse Practitioners) who all work together to provide you with the care you need, when you need it.  We recommend signing up for the patient portal called "MyChart".  Sign up information is provided on this After Visit Summary.  MyChart is used to connect with patients for Virtual Visits (Telemedicine).  Patients are able to view lab/test results, encounter notes, upcoming appointments, etc.  Non-urgent messages can be sent to your provider as well.   To learn more about what you can do with MyChart, go to [email or web address].    Your next appointment:   6 month(s)  The format for your next appointment:   In Person  Provider:   Jyl Heinz, MD   Other Instructions NA

## 2021-04-01 NOTE — Progress Notes (Signed)
Cardiology Office Note:    Date:  04/01/2021   ID:  Wendy Roth, DOB [birth year], MRN [id number]  PCP:  Javier Glazier, MD  Cardiologist:  Jenean Lindau, MD   Referring MD: Javier Glazier, MD    ASSESSMENT:    1. Coronary artery disease involving native coronary artery of native heart without angina pectoris   2. Essential hypertension   3. Mixed hyperlipidemia    PLAN:    In order of problems listed above:  Coronary artery disease: Secondary prevention stressed with the patient.  Importance of compliance with diet medication stressed and she vocalized understanding. Essential hypertension: Blood pressure stable and diet was emphasized.  Lifestyle modification was urged.  Salt intake issues were discussed. Mixed dyslipidemia: Diet was emphasized.  She will have blood work today including lipids.  She is not fasting but she has not had blood work for a long time so I want to make sure we have some baseline blood work at this point. Obesity: Weight reduction stressed risks explained and she promises to do better. Patient will be seen in follow-up appointment in 6 months or earlier if the patient has any concerns    Medication Adjustments/Labs and Tests Ordered: Current medicines are reviewed at length with the patient today.  Concerns regarding medicines are outlined above.  No orders of the defined types were placed in this encounter.  No orders of the defined types were placed in this encounter.    No chief complaint on file.    History of Present Illness:    Wendy Roth is a 87 y.o. female.  Patient has past medical history of coronary artery disease, essential hypertension dyslipidemia and obesity.  She ambulates with a walker.  She lives in an assisted living facility.  She denies any chest pain orthopnea or PND.  At the time of my evaluation, the patient is alert awake oriented and in no distress.  Past Medical History:  Diagnosis Date   1st  degree AV block 12/06/2018   Noted on EKG   Acute nasopharyngitis (common cold) 12/30/2017   Acute respiratory failure with hypoxia ([place]) 02/04/2017   Aftercare following joint replacement surgery 08/26/2019   Allergic rhinitis due to pollen 12/30/2017   Allergy, unspecified, initial encounter 07/07/2017   Amnesia 01/27/2017   Anemia, unspecified 09/09/2019   Anxiety    Anxiety disorder, unspecified 06/16/2017   Arthritis    Arthropathy 04/13/2013   Atherosclerotic heart disease of native coronary artery without angina pectoris 07/07/2017   Atrioventricular block, first degree 08/27/2019   Back pain    Benign hypertension 04/13/2013   Breast cancer ([place]) 2002   left breast   Breast cancer ([place]) 2014   right breast   Cancer of lower-inner quadrant of left female breast ([place]) 10/18/2012   Right IDC, ER/PR+, [id number]-    Candidal stomatitis 12/30/2017   Carpal tunnel syndrome    Chronic back pain 11/08/3005   Complication of anesthesia    Delirium post anesthesia   Constipation, unspecified 07/07/2017   Coronary arteriosclerosis 10/07/2003   Formatting of this note might be different from the original. Overview:  stent placed   Coronary artery disease 2005   stent placed   Degeneration of lumbar intervertebral disc 10/30/2017   Degenerative spondylolisthesis 11/18/2017   Dementia in other diseases classified elsewhere without behavioral disturbance ([place]) 07/07/2017   Depression    Depression, unspecified 07/06/2020   Diabetes mellitus due to underlying condition with unspecified complications ([place]) 03/07/2632  Diabetes mellitus without complication (HCC)    diet controlled.    Diarrhea, unspecified 12/30/2017   Difficulty in walking, not elsewhere classified 08/27/2019   Diverticulosis    Diverticulosis of intestine, part unspecified, without perforation or abscess without bleeding 08/27/2019   Dry eye syndrome of unspecified lacrimal gland 12/30/2017   Dyslipidemia 12/06/2018   Encounter for  immunization 09/09/2019   Encounter for other orthopedic aftercare 12/20/2017   Essential hypertension 04/13/2013   Family history of breast cancer 02/18/2013   Family history of malignant neoplasm of female breast 02/18/2013   Fatigue    Gastroesophageal reflux disease 11/16/2014   "years ago" but still takes Prilosec   GERD (gastroesophageal reflux disease)    "years ago" but still takes Prilosec   Glaucoma    left eye   Helicobacter pylori (H. pylori) as the cause of diseases classified elsewhere 09/09/2019   History of kidney stones    History of total knee arthroplasty 02/16/2020   HLD (hyperlipidemia) 02/04/2017   Hypercholesteremia    Hyperlipidemia, unspecified 07/07/2017   Hypertension    Hypertension, benign 04/13/2013   Hypertrophy of nasal turbinates 09/15/2013   Hypothyroidism    Hypothyroidism, unspecified 06/16/2017   Hypoxia 02/07/2017   Impacted cerumen, bilateral 12/30/2017   Ingrown nail 12/30/2017   Localized edema 07/07/2017   Low back pain 07/07/2017   Memory loss of 01/27/2017   Mild cognitive impairment, so stated 09/09/2019   Mixed dyslipidemia 12/06/2018   Muscle weakness (generalized) 12/20/2017   Nasal turbinate hypertrophy    Nausea 12/20/2017   Neurogenic claudication due to lumbar spinal stenosis 02/04/2017   Nutritional deficiency, unspecified 06/16/2017   Osteoarthritis of left knee 08/25/2019   Osteoarthritis of right knee 11/20/2017   Other allergic rhinitis 06/16/2017   Other dental procedure status 09/09/2019   Other elevated white blood cell count 12/20/2017   Other hemorrhoids 07/07/2017   Other idiopathic peripheral autonomic neuropathy 12/30/2017   Other lack of coordination 12/20/2017   Other muscle spasm 07/07/2017   Other specified symptoms and signs involving the digestive system and abdomen 09/09/2019   Pain in left ankle and joints of left foot 07/07/2017   Pain in left finger(s) 07/07/2017   Pain in left knee 07/07/2017   Pain in unspecified lower leg 12/30/2017    Pain, unspecified 06/16/2017   Pelvic and perineal pain 07/07/2017   Personal history of breast cancer 12/20/2017   Presence of left artificial knee joint 08/26/2019   Pure hypercholesterolemia, unspecified 06/16/2017   Restless leg syndrome    Restless legs syndrome 04/13/2013   Risk for falls 06/03/2017   Secondary diabetes mellitus ([place]) 12/06/2018   Seizures ([place])    as a baby   Spinal stenosis of lumbar region 10/30/2017   Spinal stenosis, lumbosacral region 12/20/2017   Status post lumbar spine operative procedure for decompression of spinal cord 12/17/2017   SVT (supraventricular tachycardia) ([place])    Type 2 diabetes mellitus ([place]) 33/00/7622   Umbilical hernia    Unspecified abdominal pain 07/07/2017   Unspecified abnormalities of gait and mobility 12/30/2017   Unspecified glaucoma 06/16/2017   Unspecified hemorrhoids 09/09/2019   Unspecified osteoarthritis, unspecified site 12/20/2017   Unspecified psychosis not due to a substance or known physiological condition ([place]) 12/30/2017   Unsteadiness on feet 12/20/2017   Vitamin B12 deficiency anemia due to intrinsic factor deficiency 07/07/2017   Vitamin D deficiency, unspecified 06/16/2017    Past Surgical History:  Procedure Laterality Date   ABDOMINAL HYSTERECTOMY  ANGIOPLASTY     x2   BREAST LUMPECTOMY Left 2002   BREAST LUMPECTOMY Right 2014   BREAST LUMPECTOMY WITH NEEDLE LOCALIZATION Right 07/05/2013   Procedure: RIGHT BREAST WIRE GUIDED LUMPECTOMY ;  Surgeon: Rolm Bookbinder, MD;  Location: [facility];  Service: General;  Laterality: Right;   CARPAL TUNNEL RELEASE     bilateral   COLONOSCOPY     CORONARY ANGIOPLASTY  2005   stent   EYE SURGERY Bilateral    cataracts   HERNIA REPAIR     INSERTION OF MESH N/A 06/11/2016   Procedure: INSERTION OF MESH;  Surgeon: Rolm Bookbinder, MD;  Location: [facility];  Service: General;  Laterality: N/A;   KNEE ARTHROPLASTY Left 08/25/2019   Procedure: COMPUTER ASSISTED TOTAL KNEE ARTHROPLASTY;   Surgeon: Rod Can, MD;  Location: [facility];  Service: Orthopedics;  Laterality: Left;   LAPAROSCOPIC INCISIONAL / UMBILICAL / VENTRAL HERNIA REPAIR  06/11/2016   UHR w/mesh   LUMBAR LAMINECTOMY/DECOMPRESSION MICRODISCECTOMY N/A 12/17/2017   Procedure: LUMBAR L3-S1 DECOMPRESSION WITH IN SITU FUSION;  Surgeon: Melina Schools, MD;  Location: [facility];  Service: Orthopedics;  Laterality: N/A;   MENISCUS REPAIR     left   PARATHYROIDECTOMY     RE-EXCISION OF BREAST CANCER,SUPERIOR MARGINS Right 08/01/2013   Procedure: RE-EXCISION OF BREAST CANCER MARGIN;  Surgeon: Rolm Bookbinder, MD;  Location: [facility];  Service: General;  Laterality: Right;   TONSILLECTOMY     UMBILICAL HERNIA REPAIR N/A 06/11/2016   Procedure: LAPAROSCOPIC UMBILICAL HERNIA;  Surgeon: Rolm Bookbinder, MD;  Location: [facility];  Service: General;  Laterality: N/A;    Current Medications: Current Meds  Medication Sig   atorvastatin (LIPITOR) 10 MG tablet Take 10 mg by mouth at bedtime.    buPROPion (WELLBUTRIN XL) 300 MG 24 hr tablet Take 300 mg by mouth every morning.   clonazePAM (KLONOPIN) 0.5 MG tablet Take 0.5 mg by mouth at bedtime.   diltiazem (CARDIZEM CD) 120 MG 24 hr capsule Take 1 capsule (120 mg total) by mouth daily.   donepezil (ARICEPT) 5 MG tablet Take 5 mg by mouth every evening.   DULoxetine (CYMBALTA) 60 MG capsule Take 120 mg by mouth every morning.   gabapentin (NEURONTIN) 300 MG capsule Take 300 mg by mouth 3 (three) times daily.   GOODSENSE PAIN RELIEF EXTRA ST 500 MG tablet Take 500 mg by mouth 3 (three) times daily.   HYDROcodone-acetaminophen (NORCO/VICODIN) 5-325 MG tablet Take 1-2 tablets by mouth every 4 (four) hours as needed for moderate pain (pain score 4-6).   hydroxypropyl methylcellulose / hypromellose (ISOPTO TEARS / GONIOVISC) 2.5 % ophthalmic solution Place 1 drop into both eyes 3 (three) times daily.    hydrOXYzine (ATARAX/VISTARIL) 10 MG tablet Take 10 mg by mouth daily at 12 noon.    LACTAID 3000 units tablet Take 3,000 Units by mouth as needed for dry skin.   latanoprost (XALATAN) 0.005 % ophthalmic solution Place 1 drop into both eyes at bedtime.   levothyroxine (SYNTHROID, LEVOTHROID) 75 MCG tablet Take 75 mcg by mouth daily before breakfast.   lisinopril (PRINIVIL,ZESTRIL) 20 MG tablet Take 20 mg by mouth daily.   magnesium oxide (MAG-OX) 400 (241.3 Mg) MG tablet Take 1 tablet (400 mg total) by mouth daily.   metoprolol succinate (TOPROL-XL) 50 MG 24 hr tablet Take 50 mg by mouth every evening. Take with or immediately following a meal.    nitroGLYCERIN (NITROSTAT) 0.4 MG SL tablet Place 0.4 mg under the tongue every 5 (  five) minutes as needed for chest pain.   omeprazole (PRILOSEC) 40 MG capsule Take 40 mg by mouth daily.   ondansetron (ZOFRAN) 4 MG tablet Take 1 tablet (4 mg total) by mouth every 6 (six) hours as needed for nausea.   polyethylene glycol (MIRALAX / GLYCOLAX) 17 g packet Take 17 g by mouth daily as needed for mild constipation.    primidone (MYSOLINE) 50 MG tablet Take 50 mg by mouth 4 (four) times daily.    risperiDONE (RISPERDAL) 0.25 MG tablet Take 0.125 mg by mouth 2 (two) times daily.    rOPINIRole (REQUIP) 0.5 MG tablet Take 0.5 mg by mouth daily.   senna (SENOKOT) 8.6 MG tablet Take 1 tablet by mouth as needed for constipation.   sertraline (ZOLOFT) 100 MG tablet Take 100 mg by mouth at bedtime.    Vitamin D, Ergocalciferol, (DRISDOL) 1.25 MG (50000 UT) CAPS capsule Take 50,000 Units by mouth once a week.     Allergies:   Cortisone   Social History   Socioeconomic History   Marital status: Married    Spouse name: Not on file   Number of children: Not on file   Years of education: Not on file   Highest education level: Not on file  Occupational History   Not on file  Tobacco Use   Smoking status: Never   Smokeless tobacco: Never  Vaping Use   Vaping Use: Never used  Substance and Sexual Activity   Alcohol use: Yes    Comment:  socially   Drug use: No   Sexual activity: Not Currently  Other Topics Concern   Not on file  Social History Narrative   Not on file   Social Determinants of Health   Financial Resource Strain: Not on file  Food Insecurity: Not on file  Transportation Needs: Not on file  Physical Activity: Not on file  Stress: Not on file  Social Connections: Not on file     Family History: The patient's family history includes Breast cancer (age of onset: 25) in her maternal aunt; Cancer in her sister; Cancer (age of onset: 56) in her father; Cancer (age of onset: 59) in her mother; Cancer (age of onset: 36) in her maternal grandmother.  ROS:   Please see the history of present illness.    All other systems reviewed and are negative.  EKGs/Labs/Other Studies Reviewed:    The following studies were reviewed today: I discussed my findings with the patient at extensive length   Recent Labs: No results found for requested labs within last 8760 hours.  Recent Lipid Panel    Component Value Date/Time   CHOL 145 02/06/2020 1139   TRIG 103 02/06/2020 1139   HDL 64 02/06/2020 1139   CHOLHDL 2.3 02/06/2020 1139   CHOLHDL 2.2 02/04/2017 0235   VLDL 14 02/04/2017 0235   LDLCALC 62 02/06/2020 1139    Physical Exam:    VS:  BP 134/78   Pulse 68   Ht 4\' 11"  (1.499 m)   Wt 160 lb 1.3 oz (72.6 kg)   SpO2 92%   BMI 32.33 kg/m     Wt Readings from Last 3 Encounters:  04/01/21 160 lb 1.3 oz (72.6 kg)  02/06/20 167 lb (75.8 kg)  08/25/19 173 lb (78.5 kg)     GEN: Patient is in no acute distress HEENT: Normal NECK: No JVD; No carotid bruits LYMPHATICS: No lymphadenopathy CARDIAC: Hear sounds regular, 2/6 systolic murmur at the apex. RESPIRATORY:  Clear  to auscultation without rales, wheezing or rhonchi  ABDOMEN: Soft, non-tender, non-distended MUSCULOSKELETAL:  No edema; No deformity  SKIN: Warm and dry NEUROLOGIC:  Alert and oriented x 3 PSYCHIATRIC:  Normal affect    Signed, Jenean Lindau, MD  04/01/2021 10:00 AM    [facility]

## 2021-04-02 LAB — BASIC METABOLIC PANEL
BUN/Creatinine Ratio: 17 (ref 12–28)
BUN: 19 mg/dL (ref 8–27)
CO2: 25 mmol/L (ref 20–29)
Calcium: 9 mg/dL (ref 8.7–10.3)
Chloride: 102 mmol/L (ref 96–106)
Creatinine, Ser: 1.13 mg/dL — ABNORMAL HIGH (ref 0.57–1.00)
Glucose: 130 mg/dL — ABNORMAL HIGH (ref 65–99)
Potassium: 4.4 mmol/L (ref 3.5–5.2)
Sodium: 140 mmol/L (ref 134–144)
eGFR: 47 mL/min/{1.73_m2} — ABNORMAL LOW (ref >59)

## 2021-04-02 LAB — LIPID PANEL
Chol/HDL Ratio: 2.4 ratio (ref 0.0–4.4)
Cholesterol, Total: 130 mg/dL (ref 100–199)
HDL: 54 mg/dL (ref >39)
LDL Chol Calc (NIH): 44 mg/dL (ref 0–99)
Triglycerides: 203 mg/dL — ABNORMAL HIGH (ref 0–149)
VLDL Cholesterol Cal: 32 mg/dL (ref 5–40)

## 2021-04-02 LAB — HEPATIC FUNCTION PANEL
ALT: 9 IU/L (ref 0–32)
AST: 12 IU/L (ref 0–40)
Albumin: 4.3 g/dL (ref 3.6–4.6)
Alkaline Phosphatase: 60 IU/L (ref 44–121)
Bilirubin Total: 0.4 mg/dL (ref 0.0–1.2)
Bilirubin, Direct: 0.12 mg/dL (ref 0.00–0.40)
Total Protein: 6.3 g/dL (ref 6.0–8.5)

## 2021-04-02 LAB — TSH: TSH: 3.25 u[IU]/mL (ref 0.450–4.500)

## 2021-04-29 ENCOUNTER — Encounter (HOSPITAL_COMMUNITY): Payer: Self-pay | Admitting: Emergency Medicine

## 2021-04-29 ENCOUNTER — Observation Stay (HOSPITAL_COMMUNITY)
Admission: EM | Admit: 2021-04-29 | Discharge: 2021-05-02 | Disposition: A | Discharge status: Home / Self Care | Payer: Medicare Other | Attending: Internal Medicine | Admitting: Internal Medicine

## 2021-04-29 ENCOUNTER — Other Ambulatory Visit: Payer: Self-pay

## 2021-04-29 ENCOUNTER — Emergency Department (HOSPITAL_COMMUNITY): Payer: Medicare Other

## 2021-04-29 DIAGNOSIS — R109 Unspecified abdominal pain: Secondary | ICD-10-CM

## 2021-04-29 DIAGNOSIS — J9811 Atelectasis: Principal | ICD-10-CM | POA: Insufficient documentation

## 2021-04-29 DIAGNOSIS — F0390 Unspecified dementia without behavioral disturbance: Secondary | ICD-10-CM | POA: Insufficient documentation

## 2021-04-29 DIAGNOSIS — J9601 Acute respiratory failure with hypoxia: Secondary | ICD-10-CM | POA: Insufficient documentation

## 2021-04-29 DIAGNOSIS — Z20822 Contact with and (suspected) exposure to covid-19: Secondary | ICD-10-CM | POA: Diagnosis not present

## 2021-04-29 DIAGNOSIS — I2510 Atherosclerotic heart disease of native coronary artery without angina pectoris: Secondary | ICD-10-CM | POA: Insufficient documentation

## 2021-04-29 DIAGNOSIS — R072 Precordial pain: Secondary | ICD-10-CM

## 2021-04-29 DIAGNOSIS — Z79899 Other long term (current) drug therapy: Secondary | ICD-10-CM | POA: Diagnosis not present

## 2021-04-29 DIAGNOSIS — I10 Essential (primary) hypertension: Secondary | ICD-10-CM | POA: Diagnosis not present

## 2021-04-29 DIAGNOSIS — R079 Chest pain, unspecified: Secondary | ICD-10-CM | POA: Diagnosis present

## 2021-04-29 DIAGNOSIS — Z96652 Presence of left artificial knee joint: Secondary | ICD-10-CM | POA: Diagnosis not present

## 2021-04-29 DIAGNOSIS — E039 Hypothyroidism, unspecified: Secondary | ICD-10-CM | POA: Diagnosis not present

## 2021-04-29 DIAGNOSIS — Z853 Personal history of malignant neoplasm of breast: Secondary | ICD-10-CM | POA: Insufficient documentation

## 2021-04-29 DIAGNOSIS — Z794 Long term (current) use of insulin: Secondary | ICD-10-CM | POA: Diagnosis not present

## 2021-04-29 LAB — CBC
HCT: 40.3 % (ref 36.0–46.0)
Hemoglobin: 12.7 g/dL (ref 12.0–15.0)
MCH: 34.5 pg — ABNORMAL HIGH (ref 26.0–34.0)
MCHC: 31.5 g/dL (ref 30.0–36.0)
MCV: 109.5 fL — ABNORMAL HIGH (ref 80.0–100.0)
Platelets: 330 10*3/uL (ref 150–400)
RBC: 3.68 MIL/uL — ABNORMAL LOW (ref 3.87–5.11)
RDW: 16.2 % — ABNORMAL HIGH (ref 11.5–15.5)
WBC: 7.1 10*3/uL (ref 4.0–10.5)
nRBC: 0 % (ref 0.0–0.2)

## 2021-04-29 LAB — RESP PANEL BY RT-PCR (FLU A&B, COVID) ARPGX2
Influenza A by PCR: NEGATIVE
Influenza B by PCR: NEGATIVE
SARS Coronavirus 2 by RT PCR: NEGATIVE

## 2021-04-29 LAB — BASIC METABOLIC PANEL
Anion gap: 6 (ref 5–15)
BUN: 10 mg/dL (ref 8–23)
CO2: 28 mmol/L (ref 22–32)
Calcium: 8.3 mg/dL — ABNORMAL LOW (ref 8.9–10.3)
Chloride: 103 mmol/L (ref 98–111)
Creatinine, Ser: 1.09 mg/dL — ABNORMAL HIGH (ref 0.44–1.00)
GFR, Estimated: 49 mL/min — ABNORMAL LOW (ref >60)
Glucose, Bld: 101 mg/dL — ABNORMAL HIGH (ref 70–99)
Potassium: 4.1 mmol/L (ref 3.5–5.1)
Sodium: 137 mmol/L (ref 135–145)

## 2021-04-29 LAB — TROPONIN I (HIGH SENSITIVITY)
Troponin I (High Sensitivity): 16 ng/L (ref <18)
Troponin I (High Sensitivity): 14 ng/L (ref <18)

## 2021-04-29 MED ORDER — ENOXAPARIN SODIUM 40 MG/0.4ML IJ SOSY
40.0000 mg | PREFILLED_SYRINGE | INTRAMUSCULAR | Status: DC
  Administered 2021-04-30 – 2021-05-02 (×3): 40 mg via SUBCUTANEOUS
  Filled 2021-04-29 (×3): qty 0.4

## 2021-04-29 MED ORDER — ALBUTEROL SULFATE HFA 108 (90 BASE) MCG/ACT IN AERS
2.0000 | INHALATION_SPRAY | Freq: Once | RESPIRATORY_TRACT | Status: AC
  Administered 2021-04-29: 2 via RESPIRATORY_TRACT
  Filled 2021-04-29: qty 6.7

## 2021-04-29 MED ORDER — IOHEXOL 350 MG/ML SOLN
60.0000 mL | Freq: Once | INTRAVENOUS | Status: AC | PRN
  Administered 2021-04-29: 60 mL via INTRAVENOUS

## 2021-04-29 NOTE — ED Provider Notes (Signed)
Emergency Medicine Provider Triage Evaluation Note  Wendy Roth , a 87 y.o. female  was evaluated in triage.  Pt complains of chest pain and weakness. Since yesterday. Patient states that she started having a dream last night that she was having chest pain, states that when she woke up she was not actually having chest pain she was just very weak.  Denies chest pain currently, however EMS administered nitro.  Patient denies any shortness of breath, did admit to some nausea prior.  Review of Systems  Positive: Chest pain?  Weakness Negative: Fevers, vomiting  Physical Exam  BP (!) 184/170 (BP Location: Right Arm)   Pulse 64   Temp 98.5 F (36.9 C) (Oral)   Resp 16   SpO2 98%  Gen:   Awake, no distress   Resp:  Normal effort  MSK:   Moves extremities without dificulty    Medical Decision Making  Medically screening exam initiated at 12:19 PM.  Appropriate orders placed.  Wendy Roth was informed that the remainder of the evaluation will be completed by another provider, this initial triage assessment does not replace that evaluation, and the importance of remaining in the ED until their evaluation is complete.  EKG with some inverted T waves.    Alfredia Client, PA-C 04/29/21 1221    Daleen Bo, MD 04/30/21 1754

## 2021-04-29 NOTE — ED Notes (Signed)
Provider at bedside

## 2021-04-29 NOTE — ED Provider Notes (Signed)
[facility] EMERGENCY DEPARTMENT Provider Note   CSN: [id number] Arrival date & time: 04/29/21  1149     History No chief complaint on file.   Wendy Roth is a 87 y.o. female.  Wendy Roth is a 87 y.o. female with a history of CAD, diabetes, hypertension, hyperlipidemia, dementia, who presents to the emergency department via EMS from [facility] for evaluation of chest pain.  Because of dementia patient is a very poor historian all she can tell me is that she had a dream that she was having severe chest pain and woke up having that pain.  She thinks that maybe she was not dreaming at all but had chest pain that woke her from sleep.  She cannot provide any other details.  Per nursing facility staff they were called into room because patient was having chest pain, on vitals check she was noted to be tachycardic with some increased work of breathing and reported feeling lightheaded but did not syncopize.  Patient reports this feels similar to when she had stents placed previously, history of multiple cardiac interventions many years ago.  Patient given aspirin and nitro which relieved her symptoms.  Currently reports she feels back to normal, is noted to be on 2 L nasal cannula though and per facility does not require any oxygen there.  Facility reports patient is at baseline.  No other aggravating or alleviating factors.  Level 5 caveat: Dementia  The history is provided by the patient, the nursing home and the EMS personnel.      Past Medical History:  Diagnosis Date   1st degree AV block 12/06/2018   Noted on EKG   Acute nasopharyngitis (common cold) 12/30/2017   Acute respiratory failure with hypoxia ([place]) 02/04/2017   Aftercare following joint replacement surgery 08/26/2019   Allergic rhinitis due to pollen 12/30/2017   Allergy, unspecified, initial encounter 07/07/2017   Amnesia 01/27/2017   Anemia, unspecified 09/09/2019   Anxiety    Anxiety  disorder, unspecified 06/16/2017   Arthritis    Arthropathy 04/13/2013   Atherosclerotic heart disease of native coronary artery without angina pectoris 07/07/2017   Atrioventricular block, first degree 08/27/2019   Back pain    Benign hypertension 04/13/2013   Breast cancer ([place]) 2002   left breast   Breast cancer ([place]) 2014   right breast   Cancer of lower-inner quadrant of left female breast ([place]) 10/18/2012   Right IDC, ER/PR+, [id number]    Candidal stomatitis 12/30/2017   Carpal tunnel syndrome    Chronic back pain [id number]   Complication of anesthesia    Delirium post anesthesia   Constipation, unspecified 07/07/2017   Coronary arteriosclerosis 10/07/2003   Formatting of this note might be different from the original. Overview:  stent placed   Coronary artery disease 2005   stent placed   Degeneration of lumbar intervertebral disc 10/30/2017   Degenerative spondylolisthesis 11/18/2017   Dementia in other diseases classified elsewhere without behavioral disturbance ([place]) 07/07/2017   Depression    Depression, unspecified 07/06/2020   Diabetes mellitus due to underlying condition with unspecified complications ([place]) [id number]   Diabetes mellitus without complication (HCC)    diet controlled.    Diarrhea, unspecified 12/30/2017   Difficulty in walking, not elsewhere classified 08/27/2019   Diverticulosis    Diverticulosis of intestine, part unspecified, without perforation or abscess without bleeding 08/27/2019   Dry eye syndrome of unspecified lacrimal gland 12/30/2017   Dyslipidemia 12/06/2018   Encounter for immunization  09/09/2019   Encounter for other orthopedic aftercare 12/20/2017   Essential hypertension 04/13/2013   Family history of breast cancer 02/18/2013   Family history of malignant neoplasm of female breast 02/18/2013   Fatigue    Gastroesophageal reflux disease 11/16/2014   "years ago" but still takes Prilosec   GERD (gastroesophageal reflux disease)    "years ago" but still takes  Prilosec   Glaucoma    left eye   Helicobacter pylori (H. pylori) as the cause of diseases classified elsewhere 09/09/2019   History of kidney stones    History of total knee arthroplasty 02/16/2020   HLD (hyperlipidemia) 02/04/2017   Hypercholesteremia    Hyperlipidemia, unspecified 07/07/2017   Hypertension    Hypertension, benign 04/13/2013   Hypertrophy of nasal turbinates 09/15/2013   Hypothyroidism    Hypothyroidism, unspecified 06/16/2017   Hypoxia 02/07/2017   Impacted cerumen, bilateral 12/30/2017   Ingrown nail 12/30/2017   Localized edema 07/07/2017   Low back pain 07/07/2017   Memory loss of 01/27/2017   Mild cognitive impairment, so stated 09/09/2019   Mixed dyslipidemia 12/06/2018   Muscle weakness (generalized) 12/20/2017   Nasal turbinate hypertrophy    Nausea 12/20/2017   Neurogenic claudication due to lumbar spinal stenosis 02/04/2017   Nutritional deficiency, unspecified 06/16/2017   Osteoarthritis of left knee 08/25/2019   Osteoarthritis of right knee 11/20/2017   Other allergic rhinitis 06/16/2017   Other dental procedure status 09/09/2019   Other elevated white blood cell count 12/20/2017   Other hemorrhoids 07/07/2017   Other idiopathic peripheral autonomic neuropathy 12/30/2017   Other lack of coordination 12/20/2017   Other muscle spasm 07/07/2017   Other specified symptoms and signs involving the digestive system and abdomen 09/09/2019   Pain in left ankle and joints of left foot 07/07/2017   Pain in left finger(s) 07/07/2017   Pain in left knee 07/07/2017   Pain in unspecified lower leg 12/30/2017   Pain, unspecified 06/16/2017   Pelvic and perineal pain 07/07/2017   Personal history of breast cancer 12/20/2017   Presence of left artificial knee joint 08/26/2019   Pure hypercholesterolemia, unspecified 06/16/2017   Restless leg syndrome    Restless legs syndrome 04/13/2013   Risk for falls 06/03/2017   Secondary diabetes mellitus ([place]) 12/06/2018   Seizures ([place])    as a baby    Spinal stenosis of lumbar region 10/30/2017   Spinal stenosis, lumbosacral region 12/20/2017   Status post lumbar spine operative procedure for decompression of spinal cord 12/17/2017   SVT (supraventricular tachycardia) (HCC)    Type 2 diabetes mellitus ([place]) [id number]   Umbilical hernia    Unspecified abdominal pain 07/07/2017   Unspecified abnormalities of gait and mobility 12/30/2017   Unspecified glaucoma 06/16/2017   Unspecified hemorrhoids 09/09/2019   Unspecified osteoarthritis, unspecified site 12/20/2017   Unspecified psychosis not due to a substance or known physiological condition ([place]) 12/30/2017   Unsteadiness on feet 12/20/2017   Vitamin B12 deficiency anemia due to intrinsic factor deficiency 07/07/2017   Vitamin D deficiency, unspecified 06/16/2017    Patient Active Problem List   Diagnosis Date Noted   GERD (gastroesophageal reflux disease)    Arthritis    Back pain    Carpal tunnel syndrome    Complication of anesthesia    Diverticulosis    Fatigue    Glaucoma    History of kidney stones    Hypercholesteremia    Hypertension    Nasal turbinate hypertrophy    Restless leg syndrome  Seizures ([place])    Depression, unspecified 07/06/2020   History of total knee arthroplasty 02/16/2020   Anemia, unspecified 09/09/2019   Encounter for immunization 09/09/2019   Mild cognitive impairment, so stated 09/09/2019   Other specified symptoms and signs involving the digestive system and abdomen 09/09/2019   Unspecified hemorrhoids [id number]   Helicobacter pylori (H. pylori) as the cause of diseases classified elsewhere 09/09/2019   Other dental procedure status 09/09/2019   Difficulty in walking, not elsewhere classified 08/27/2019   Atrioventricular block, first degree 08/27/2019   Diverticulosis of intestine, part unspecified, without perforation or abscess without bleeding 08/27/2019   Aftercare following joint replacement surgery 08/26/2019   Presence of left artificial  knee joint 08/26/2019   Osteoarthritis of left knee 08/25/2019   Diabetes mellitus due to underlying condition with unspecified complications ([place]) [id number]   Mixed dyslipidemia 12/06/2018   1st degree AV block 12/06/2018   Dyslipidemia 12/06/2018   Secondary diabetes mellitus ([place]) 12/06/2018   Acute nasopharyngitis (common cold) 12/30/2017   Allergic rhinitis due to pollen 12/30/2017   Candidal stomatitis 12/30/2017   Diarrhea, unspecified 12/30/2017   Dry eye syndrome of unspecified lacrimal gland 12/30/2017   Impacted cerumen, bilateral 12/30/2017   Ingrown nail 12/30/2017   Other idiopathic peripheral autonomic neuropathy 12/30/2017   Unspecified abnormalities of gait and mobility 12/30/2017   Unspecified psychosis not due to a substance or known physiological condition ([place]) 12/30/2017   Pain in unspecified lower leg 12/30/2017   Encounter for other orthopedic aftercare 12/20/2017   Muscle weakness (generalized) 12/20/2017   Nausea 12/20/2017   Other elevated white blood cell count 12/20/2017   Other lack of coordination 12/20/2017   Personal history of breast cancer 12/20/2017   Unsteadiness on feet 12/20/2017   Unspecified osteoarthritis, unspecified site 12/20/2017   Spinal stenosis, lumbosacral region 12/20/2017   Status post lumbar spine operative procedure for decompression of spinal cord 12/17/2017   Osteoarthritis of right knee 11/20/2017   Degenerative spondylolisthesis 11/18/2017   Degeneration of lumbar intervertebral disc 10/30/2017   Spinal stenosis of lumbar region 10/30/2017   Pain in left knee 07/07/2017   Allergy, unspecified, initial encounter 07/07/2017   Dementia in other diseases classified elsewhere without behavioral disturbance ([place]) 07/07/2017   Constipation, unspecified 07/07/2017   Localized edema 07/07/2017   Other muscle spasm 07/07/2017   Pain in left finger(s) 07/07/2017   Pelvic and perineal pain 07/07/2017   Other hemorrhoids 07/07/2017    Vitamin B12 deficiency anemia due to intrinsic factor deficiency 07/07/2017   Low back pain 07/07/2017   Pain in left ankle and joints of left foot 07/07/2017   Unspecified abdominal pain 07/07/2017   Atherosclerotic heart disease of native coronary artery without angina pectoris 07/07/2017   Hyperlipidemia, unspecified 07/07/2017   Nutritional deficiency, unspecified 06/16/2017   Other allergic rhinitis 06/16/2017   Pain, unspecified 06/16/2017   Vitamin D deficiency, unspecified 06/16/2017   Anxiety disorder, unspecified 06/16/2017   Unspecified glaucoma 06/16/2017   Pure hypercholesterolemia, unspecified 06/16/2017   Hypothyroidism, unspecified 06/16/2017   Risk for falls 06/03/2017   Hypoxia 02/07/2017   SVT (supraventricular tachycardia) ([place]) 02/04/2017   HLD (hyperlipidemia) 02/04/2017   Depression 02/04/2017   Chronic back pain 02/04/2017   Acute respiratory failure with hypoxia ([place]) 02/04/2017   Neurogenic claudication due to lumbar spinal stenosis 02/04/2017   Hypothyroidism    Anxiety    Memory loss of 01/27/2017   Amnesia [id number]   Umbilical hernia [id number]   Gastroesophageal reflux disease 11/16/2014  Hypertrophy of nasal turbinates 09/15/2013   Type 2 diabetes mellitus ([place]) 09/15/2013   Arthropathy 04/13/2013   Hypertension, benign 04/13/2013   Restless legs syndrome 04/13/2013   Essential hypertension 04/13/2013   Benign hypertension 04/13/2013   Family history of breast cancer 02/18/2013   Family history of malignant neoplasm of female breast 02/18/2013   Cancer of lower-inner quadrant of left female breast ([place]) 10/18/2012   Coronary artery disease 10/07/2003   Coronary arteriosclerosis 10/07/2003   Breast cancer ([place]) 2002    Past Surgical History:  Procedure Laterality Date   ABDOMINAL HYSTERECTOMY     ANGIOPLASTY     x2   BREAST LUMPECTOMY Left 2002   BREAST LUMPECTOMY Right 2014   BREAST LUMPECTOMY WITH NEEDLE LOCALIZATION Right  07/05/2013   Procedure: RIGHT BREAST WIRE GUIDED LUMPECTOMY ;  Surgeon: Rolm Bookbinder, MD;  Location: [facility];  Service: General;  Laterality: Right;   CARPAL TUNNEL RELEASE     bilateral   COLONOSCOPY     CORONARY ANGIOPLASTY  2005   stent   EYE SURGERY Bilateral    cataracts   HERNIA REPAIR     INSERTION OF MESH N/A 06/11/2016   Procedure: INSERTION OF MESH;  Surgeon: Rolm Bookbinder, MD;  Location: [facility];  Service: General;  Laterality: N/A;   KNEE ARTHROPLASTY Left 08/25/2019   Procedure: COMPUTER ASSISTED TOTAL KNEE ARTHROPLASTY;  Surgeon: Rod Can, MD;  Location: [facility];  Service: Orthopedics;  Laterality: Left;   LAPAROSCOPIC INCISIONAL / UMBILICAL / VENTRAL HERNIA REPAIR  06/11/2016   UHR w/mesh   LUMBAR LAMINECTOMY/DECOMPRESSION MICRODISCECTOMY N/A 12/17/2017   Procedure: LUMBAR L3-S1 DECOMPRESSION WITH IN SITU FUSION;  Surgeon: Melina Schools, MD;  Location: [facility];  Service: Orthopedics;  Laterality: N/A;   MENISCUS REPAIR     left   PARATHYROIDECTOMY     RE-EXCISION OF BREAST CANCER,SUPERIOR MARGINS Right 08/01/2013   Procedure: RE-EXCISION OF BREAST CANCER MARGIN;  Surgeon: Rolm Bookbinder, MD;  Location: [facility];  Service: General;  Laterality: Right;   TONSILLECTOMY     UMBILICAL HERNIA REPAIR N/A 06/11/2016   Procedure: LAPAROSCOPIC UMBILICAL HERNIA;  Surgeon: Rolm Bookbinder, MD;  Location: [facility];  Service: General;  Laterality: N/A;     OB History   No obstetric history on file.     Family History  Problem Relation Age of Onset   Cancer Mother 58       lung cancer    Cancer Father 105       possible breast cancer   Cancer Sister        lung cancer ? primary; paternal half sister   Breast cancer Maternal Aunt 70       ? bilateral   Cancer Maternal Grandmother 79       prostate cancer    Social History   Tobacco Use   Smoking status: Never   Smokeless tobacco: Never  Vaping Use   Vaping Use: Never used  Substance Use Topics   Alcohol use:  Yes    Comment: socially   Drug use: No    Home Medications Prior to Admission medications   Medication Sig Start Date End Date Taking? Authorizing Provider  atorvastatin (LIPITOR) 10 MG tablet Take 10 mg by mouth at bedtime.  04/07/19   [provider]  buPROPion (WELLBUTRIN XL) 300 MG 24 hr tablet Take 300 mg by mouth every morning. 10/04/20   [provider]  clonazePAM (KLONOPIN) 0.5 MG tablet Take 0.5 mg by mouth  at bedtime. 11/11/20   [provider]  diltiazem (CARDIZEM CD) 120 MG 24 hr capsule Take 1 capsule (120 mg total) by mouth daily. 02/05/17   Reyne Dumas, MD  donepezil (ARICEPT) 5 MG tablet Take 5 mg by mouth every evening.    [provider]  DULoxetine (CYMBALTA) 60 MG capsule Take 120 mg by mouth every morning. 10/04/20   [provider]  gabapentin (NEURONTIN) 300 MG capsule Take 300 mg by mouth 3 (three) times daily.    [provider]  GOODSENSE PAIN RELIEF EXTRA ST 500 MG tablet Take 500 mg by mouth 3 (three) times daily. 03/20/21   [provider]  HYDROcodone-acetaminophen (NORCO/VICODIN) 5-325 MG tablet Take 1-2 tablets by mouth every 4 (four) hours as needed for moderate pain (pain score 4-6). 08/26/19   Swinteck, Aaron Edelman, MD  hydroxypropyl methylcellulose / hypromellose (ISOPTO TEARS / GONIOVISC) 2.5 % ophthalmic solution Place 1 drop into both eyes 3 (three) times daily.     [provider]  hydrOXYzine (ATARAX/VISTARIL) 10 MG tablet Take 10 mg by mouth daily at 12 noon. 11/08/20   [provider]  LACTAID 3000 units tablet Take 3,000 Units by mouth as needed for dry skin. 01/25/20   [provider]  latanoprost (XALATAN) 0.005 % ophthalmic solution Place 1 drop into both eyes at bedtime.    [provider]  levothyroxine (SYNTHROID, LEVOTHROID) 75 MCG tablet Take 75 mcg by mouth daily before breakfast.    [provider]  lisinopril (PRINIVIL,ZESTRIL) 20 MG tablet Take  20 mg by mouth daily.    [provider]  magnesium oxide (MAG-OX) 400 (241.3 Mg) MG tablet Take 1 tablet (400 mg total) by mouth daily. 02/06/17   Reyne Dumas, MD  metoprolol succinate (TOPROL-XL) 50 MG 24 hr tablet Take 50 mg by mouth every evening. Take with or immediately following a meal.     [provider]  nitroGLYCERIN (NITROSTAT) 0.4 MG SL tablet Place 0.4 mg under the tongue every 5 (five) minutes as needed for chest pain.    [provider]  omeprazole (PRILOSEC) 40 MG capsule Take 40 mg by mouth daily.    [provider]  ondansetron (ZOFRAN) 4 MG tablet Take 1 tablet (4 mg total) by mouth every 6 (six) hours as needed for nausea. 08/26/19   Swinteck, Aaron Edelman, MD  polyethylene glycol (MIRALAX / GLYCOLAX) 17 g packet Take 17 g by mouth daily as needed for mild constipation.     [provider]  primidone (MYSOLINE) 50 MG tablet Take 50 mg by mouth 4 (four) times daily.  07/07/18   [provider]  risperiDONE (RISPERDAL) 0.25 MG tablet Take 0.125 mg by mouth 2 (two) times daily.  02/24/19   [provider]  rOPINIRole (REQUIP) 0.5 MG tablet Take 0.5 mg by mouth daily. 11/09/20   [provider]  senna (SENOKOT) 8.6 MG tablet Take 1 tablet by mouth as needed for constipation.    [provider]  sertraline (ZOLOFT) 100 MG tablet Take 100 mg by mouth at bedtime.  09/24/18   [provider]  Vitamin D, Ergocalciferol, (DRISDOL) 1.25 MG (50000 UT) CAPS capsule Take 50,000 Units by mouth once a week. 06/30/19   [provider]    Allergies    Cortisone  Review of Systems   Review of Systems  Unable to perform ROS: Dementia   Physical Exam Updated Vital Signs BP (!) 112/100   Pulse 64   Temp  97.7 F (36.5 C) (Oral)   Resp 12   SpO2 99%   Physical Exam Vitals and nursing note reviewed.  Constitutional:      General: She is not in acute distress.    Appearance: Normal appearance. She is  well-developed. She is not diaphoretic.     Comments: Elderly female, alert, pleasantly demented.  HENT:     Head: Normocephalic and atraumatic.     Mouth/Throat:     Mouth: Mucous membranes are moist.     Pharynx: Oropharynx is clear.  Eyes:     General:        Right eye: No discharge.        Left eye: No discharge.  Cardiovascular:     Rate and Rhythm: Normal rate and regular rhythm.     Pulses: Normal pulses.     Heart sounds: Normal heart sounds. No murmur heard.   No friction rub. No gallop.  Pulmonary:     Effort: Pulmonary effort is normal. No respiratory distress.     Breath sounds: Normal breath sounds. No wheezing or rales.     Comments: On 2 L patient maintaining normal O2 sats, on room air patient sats dropped to 88%.  On auscultation lungs clear but diminished, no wheezes or rhonchi. Abdominal:     General: Bowel sounds are normal. There is no distension.     Palpations: Abdomen is soft. There is no mass.     Tenderness: There is no abdominal tenderness. There is no guarding.     Comments: Abdomen soft, nondistended, nontender to palpation in all quadrants without guarding or peritoneal signs  Musculoskeletal:        General: No deformity.     Cervical back: Neck supple.     Right lower leg: No edema.     Left lower leg: No edema.  Skin:    General: Skin is warm and dry.     Capillary Refill: Capillary refill takes less than 2 seconds.  Neurological:     Mental Status: She is alert and oriented to person, place, and time.     Coordination: Coordination normal.     Comments: Speech is clear, able to follow commands Moves extremities without ataxia, coordination intact  Psychiatric:        Mood and Affect: Mood normal.        Behavior: Behavior normal.    ED Results / Procedures / Treatments   Labs (all labs ordered are listed, but only abnormal results are displayed) Labs Reviewed  BASIC METABOLIC PANEL - Abnormal; Notable for the following components:       Result Value   Glucose, Bld 101 (*)    Creatinine, Ser 1.09 (*)    Calcium 8.3 (*)    GFR, Estimated 49 (*)    All other components within normal limits  CBC - Abnormal; Notable for the following components:   RBC 3.68 (*)    MCV 109.5 (*)    MCH 34.5 (*)    RDW 16.2 (*)    All other components within normal limits  RESP PANEL BY RT-PCR (FLU A&B, COVID) ARPGX2  CBC  CREATININE, SERUM  TROPONIN I (HIGH SENSITIVITY)  TROPONIN I (HIGH SENSITIVITY)    EKG None  Radiology DG Chest 2 View  Result Date: 04/29/2021 CLINICAL DATA:  Chest pain. EXAM: CHEST - 2 VIEW COMPARISON:  Feb 07, 2017. FINDINGS: The heart size and mediastinal contours are within normal limits. Minimal bibasilar subsegmental atelectasis is noted. The  visualized skeletal structures are unremarkable. IMPRESSION: Minimal bibasilar subsegmental atelectasis. Electronically Signed   By: Marijo Conception M.D.   On: 04/29/2021 13:52   CT Angio Chest PE W and/or Wo Contrast  Result Date: 04/29/2021 CLINICAL DATA:  87 year old female with concern for pulmonary embolism. EXAM: CT ANGIOGRAPHY CHEST WITH CONTRAST TECHNIQUE: Multidetector CT imaging of the chest was performed using the standard protocol during bolus administration of intravenous contrast. Multiplanar CT image reconstructions and MIPs were obtained to evaluate the vascular anatomy. CONTRAST:  47m OMNIPAQUE IOHEXOL 350 MG/ML SOLN COMPARISON:  Chest CT dated 02/04/2017 and radiograph dated 04/29/2021. FINDINGS: Cardiovascular: There is no cardiomegaly or pericardial effusion. There is coronary vascular calcification. Mild atherosclerotic calcification of the thoracic aorta. No aneurysmal dilatation. No pulmonary artery embolus identified. Mediastinum/Nodes: There is no hilar or mediastinal adenopathy. The esophagus is grossly unremarkable. No mediastinal fluid collection. Lungs/Pleura: Bibasilar subpleural linear atelectasis/scarring. No focal consolidation, pleural effusion,  or pneumothorax. The central airways are patent. Upper Abdomen: No acute abnormality. Musculoskeletal: Osteopenia with degenerative changes of the spine. No acute osseous pathology. Review of the MIP images confirms the above findings. IMPRESSION: No acute intrathoracic pathology. No CT evidence of pulmonary embolism. Electronically Signed   By: AAnner CreteM.D.   On: 04/29/2021 22:34    Procedures Procedures   Medications Ordered in ED Medications  enoxaparin (LOVENOX) injection 40 mg (has no administration in time range)  iohexol (OMNIPAQUE) 350 MG/ML injection 60 mL (60 mLs Intravenous Contrast Given 04/29/21 2217)  albuterol (VENTOLIN HFA) 108 (90 Base) MCG/ACT inhaler 2 puff (2 puffs Inhalation Given 04/29/21 2256)    ED Course  I have reviewed the triage vital signs and the nursing notes.  Pertinent labs & imaging results that were available during my care of the patient were reviewed by me and considered in my medical decision making (see chart for details).  MDM Rules/Calculators/A&P                           87year old female with significant history of coronary artery disease with multiple stents presents with chest pain that was relieved by nitro with EMS.  Patient with dementia, extremely limited history.  Facility reports she also had tachycardia, appeared quite uncomfortable and had near syncopal episode.  On arrival here she is hypertensive intermittently, patient on 2 L nasal cannula, no oxygen requirement at facility.  Unclear cause for hypoxia, but when placed on room air patient with sats of 88%.  She does not sound diminished on exam.  In addition to cardiac work-up we will also get PE study.  I have independently ordered, reviewed and interpreted all labs and imaging: CBC: No leukocytosis, normal hemoglobin BMP: No significant electrolyte derangements, creatinine at baseline Troponin: Negative x2 COVID/flu: Negative  EKG: Sinus rhythm without concerning ischemic  changes.  CXR: Minimal basilar atelectasis  CTA: No evidence of PE, no other acute cardiopulmonary disease.  Patient extremely high risk with chest pain that was relieved with nitro concerning for potential cardiac etiology, also with new hypoxia, unclear etiology, very diminished on exam, may be related to some underlying COPD.  Will treat with albuterol.  Will admit to medicine.  Case discussed with Dr. [facility] hospitalist who will see and admit the patient.  Final Clinical Impression(s) / ED Diagnoses Final diagnoses:  Acute respiratory failure with hypoxia ([place]  Substernal chest pain relieved by nitroglycerin    Rx / DC Orders ED Discharge  Orders     None        Janet Berlin 04/29/21 2359    Drenda Freeze, MD 04/30/21 [phone]

## 2021-04-29 NOTE — ED Triage Notes (Addendum)
Reports pain across chest and into throat during the middle of the night.  Denies pain at present.  EMS administered NTG x 1 and pain relieved.  Denies SOB.  Reports nausea that has resolved.  20 g IV R FA

## 2021-04-29 NOTE — ED Notes (Signed)
Returned from radiology. 

## 2021-04-29 NOTE — ED Notes (Signed)
Ambulated to restroom with assistance.

## 2021-04-29 NOTE — H&P (Signed)
History and Physical  Wendy Roth [id number] DOB: 06/02/1932 DOA: 04/29/2021  Referring physician: Followed, PA-EDP. PCP: Javier Glazier, MD  Outpatient Specialists: Cardiology. Patient coming from: Assisted living facility, lives with her husband who is medical POA.  Chief Complaint: Chest pain  HPI: Wendy Roth is a 87 y.o. female with medical history significant for mild dementia, coronary artery disease, essential hypertension, type 2 diabetes, chronic anxiety/depression, hypothyroidism, who presented to [facility] ED from assisted living facility with complaints of chest pain.  Nonradiating.  She is a poor historian.  Called her husband to obtain more information but unsuccessfully, no answer.  Majority of the history is obtained from EDP and review of medical records.  Patient had a dream that she was having severe chest pain and woke up having chest pain.  Nursing staff was called to the room and on vitals signs she was noted to be tachycardic and had some increase work of breathing with complaints of lightheadedness.  EMS was activated.  She was given sublingual nitroglycerin and a full dose aspirin with resolution of her chest pain.  On EDPs evaluation she was noted to be hypoxic with O2 saturation of 88% on room air.  Not on oxygen supplementation at baseline.  She had a CTA chest evaluation which was negative for PE.  No evidence of pneumonia or pulmonary edema however showed atelectasis and scarring.  EDP requested admission due to acute hypoxic respiratory failure.  Patient was admitted under hospitalist service.  ED Course: Temperature, T-max 99.5.  BP 134/55, pulse 67, respiratory rate 13, O2 saturation 84% on room air.  Lab studies remarkable for creatinine 1.09 with GFR 49.  CBC was unremarkable.  Review of Systems: Review of systems as noted in the HPI. All other systems reviewed and are negative.   Past Medical History:  Diagnosis Date   1st degree AV block  12/06/2018   Noted on EKG   Acute nasopharyngitis (common cold) 12/30/2017   Acute respiratory failure with hypoxia ([place]) 02/04/2017   Aftercare following joint replacement surgery 08/26/2019   Allergic rhinitis due to pollen 12/30/2017   Allergy, unspecified, initial encounter 07/07/2017   Amnesia 01/27/2017   Anemia, unspecified 09/09/2019   Anxiety    Anxiety disorder, unspecified 06/16/2017   Arthritis    Arthropathy 04/13/2013   Atherosclerotic heart disease of native coronary artery without angina pectoris 07/07/2017   Atrioventricular block, first degree 08/27/2019   Back pain    Benign hypertension 04/13/2013   Breast cancer ([place]) 2002   left breast   Breast cancer ([place]) 2014   right breast   Cancer of lower-inner quadrant of left female breast ([place]) 10/18/2012   Right IDC, ER/PR+, Her2neu-    Candidal stomatitis 12/30/2017   Carpal tunnel syndrome    Chronic back pain 99991111   Complication of anesthesia    Delirium post anesthesia   Constipation, unspecified 07/07/2017   Coronary arteriosclerosis 10/07/2003   Formatting of this note might be different from the original. Overview:  stent placed   Coronary artery disease 2005   stent placed   Degeneration of lumbar intervertebral disc 10/30/2017   Degenerative spondylolisthesis 11/18/2017   Dementia in other diseases classified elsewhere without behavioral disturbance ([place]) 07/07/2017   Depression    Depression, unspecified 07/06/2020   Diabetes mellitus due to underlying condition with unspecified complications ([place]) [id number]   Diabetes mellitus without complication (HCC)    diet controlled.    Diarrhea, unspecified 12/30/2017   Difficulty in walking,  not elsewhere classified 08/27/2019   Diverticulosis    Diverticulosis of intestine, part unspecified, without perforation or abscess without bleeding 08/27/2019   Dry eye syndrome of unspecified lacrimal gland 12/30/2017   Dyslipidemia 12/06/2018   Encounter for immunization 09/09/2019    Encounter for other orthopedic aftercare 12/20/2017   Essential hypertension 04/13/2013   Family history of breast cancer 02/18/2013   Family history of malignant neoplasm of female breast 02/18/2013   Fatigue    Gastroesophageal reflux disease 11/16/2014   "years ago" but still takes Prilosec   GERD (gastroesophageal reflux disease)    "years ago" but still takes Prilosec   Glaucoma    left eye   Helicobacter pylori (H. pylori) as the cause of diseases classified elsewhere 09/09/2019   History of kidney stones    History of total knee arthroplasty 02/16/2020   HLD (hyperlipidemia) 02/04/2017   Hypercholesteremia    Hyperlipidemia, unspecified 07/07/2017   Hypertension    Hypertension, benign 04/13/2013   Hypertrophy of nasal turbinates 09/15/2013   Hypothyroidism    Hypothyroidism, unspecified 06/16/2017   Hypoxia 02/07/2017   Impacted cerumen, bilateral 12/30/2017   Ingrown nail 12/30/2017   Localized edema 07/07/2017   Low back pain 07/07/2017   Memory loss of 01/27/2017   Mild cognitive impairment, so stated 09/09/2019   Mixed dyslipidemia 12/06/2018   Muscle weakness (generalized) 12/20/2017   Nasal turbinate hypertrophy    Nausea 12/20/2017   Neurogenic claudication due to lumbar spinal stenosis 02/04/2017   Nutritional deficiency, unspecified 06/16/2017   Osteoarthritis of left knee 08/25/2019   Osteoarthritis of right knee 11/20/2017   Other allergic rhinitis 06/16/2017   Other dental procedure status 09/09/2019   Other elevated white blood cell count 12/20/2017   Other hemorrhoids 07/07/2017   Other idiopathic peripheral autonomic neuropathy 12/30/2017   Other lack of coordination 12/20/2017   Other muscle spasm 07/07/2017   Other specified symptoms and signs involving the digestive system and abdomen 09/09/2019   Pain in left ankle and joints of left foot 07/07/2017   Pain in left finger(s) 07/07/2017   Pain in left knee 07/07/2017   Pain in unspecified lower leg 12/30/2017   Pain, unspecified  06/16/2017   Pelvic and perineal pain 07/07/2017   Personal history of breast cancer 12/20/2017   Presence of left artificial knee joint 08/26/2019   Pure hypercholesterolemia, unspecified 06/16/2017   Restless leg syndrome    Restless legs syndrome 04/13/2013   Risk for falls 06/03/2017   Secondary diabetes mellitus ([place]) 12/06/2018   Seizures ([place])    as a baby   Spinal stenosis of lumbar region 10/30/2017   Spinal stenosis, lumbosacral region 12/20/2017   Status post lumbar spine operative procedure for decompression of spinal cord 12/17/2017   SVT (supraventricular tachycardia) ([place])    Type 2 diabetes mellitus ([place]) 99991111   Umbilical hernia    Unspecified abdominal pain 07/07/2017   Unspecified abnormalities of gait and mobility 12/30/2017   Unspecified glaucoma 06/16/2017   Unspecified hemorrhoids 09/09/2019   Unspecified osteoarthritis, unspecified site 12/20/2017   Unspecified psychosis not due to a substance or known physiological condition ([place]) 12/30/2017   Unsteadiness on feet 12/20/2017   Vitamin B12 deficiency anemia due to intrinsic factor deficiency 07/07/2017   Vitamin D deficiency, unspecified 06/16/2017   Past Surgical History:  Procedure Laterality Date   ABDOMINAL HYSTERECTOMY     ANGIOPLASTY     x2   BREAST LUMPECTOMY Left 2002   BREAST LUMPECTOMY Right 2014  BREAST LUMPECTOMY WITH NEEDLE LOCALIZATION Right 07/05/2013   Procedure: RIGHT BREAST WIRE GUIDED LUMPECTOMY ;  Surgeon: Rolm Bookbinder, MD;  Location: [facility];  Service: General;  Laterality: Right;   CARPAL TUNNEL RELEASE     bilateral   COLONOSCOPY     CORONARY ANGIOPLASTY  2005   stent   EYE SURGERY Bilateral    cataracts   HERNIA REPAIR     INSERTION OF MESH N/A 06/11/2016   Procedure: INSERTION OF MESH;  Surgeon: Rolm Bookbinder, MD;  Location: [facility];  Service: General;  Laterality: N/A;   KNEE ARTHROPLASTY Left 08/25/2019   Procedure: COMPUTER ASSISTED TOTAL KNEE ARTHROPLASTY;  Surgeon: Rod Can, MD;  Location: [facility];  Service: Orthopedics;  Laterality: Left;   LAPAROSCOPIC INCISIONAL / UMBILICAL / VENTRAL HERNIA REPAIR  06/11/2016   UHR w/mesh   LUMBAR LAMINECTOMY/DECOMPRESSION MICRODISCECTOMY N/A 12/17/2017   Procedure: LUMBAR L3-S1 DECOMPRESSION WITH IN SITU FUSION;  Surgeon: Melina Schools, MD;  Location: [facility];  Service: Orthopedics;  Laterality: N/A;   MENISCUS REPAIR     left   PARATHYROIDECTOMY     RE-EXCISION OF BREAST CANCER,SUPERIOR MARGINS Right 08/01/2013   Procedure: RE-EXCISION OF BREAST CANCER MARGIN;  Surgeon: Rolm Bookbinder, MD;  Location: [facility];  Service: General;  Laterality: Right;   TONSILLECTOMY     UMBILICAL HERNIA REPAIR N/A 06/11/2016   Procedure: LAPAROSCOPIC UMBILICAL HERNIA;  Surgeon: Rolm Bookbinder, MD;  Location: [facility];  Service: General;  Laterality: N/A;    Social History:  reports that she has never smoked. She has never used smokeless tobacco. She reports current alcohol use. She reports that she does not use drugs.   Allergies  Allergen Reactions   Cortisone Shortness Of Breath    She blames symptoms of SOB & palpitations on shot of cortisone.     Family History  Problem Relation Age of Onset   Cancer Mother 33       lung cancer    Cancer Father 45       possible breast cancer   Cancer Sister        lung cancer ? primary; paternal half sister   Breast cancer Maternal Aunt 58       ? bilateral   Cancer Maternal Grandmother 79       prostate cancer      Prior to Admission medications   Medication Sig Start Date End Date Taking? Authorizing Provider  atorvastatin (LIPITOR) 10 MG tablet Take 10 mg by mouth at bedtime.  04/07/19   [provider]  buPROPion (WELLBUTRIN XL) 300 MG 24 hr tablet Take 300 mg by mouth every morning. 10/04/20   [provider]  clonazePAM (KLONOPIN) 0.5 MG tablet Take 0.5 mg by mouth at bedtime. 11/11/20   [provider]  diltiazem (CARDIZEM CD) 120 MG 24 hr capsule Take  1 capsule (120 mg total) by mouth daily. 02/05/17   Reyne Dumas, MD  donepezil (ARICEPT) 5 MG tablet Take 5 mg by mouth every evening.    [provider]  DULoxetine (CYMBALTA) 60 MG capsule Take 120 mg by mouth every morning. 10/04/20   [provider]  gabapentin (NEURONTIN) 300 MG capsule Take 300 mg by mouth 3 (three) times daily.    [provider]  GOODSENSE PAIN RELIEF EXTRA ST 500 MG tablet Take 500 mg by mouth 3 (three) times daily. 03/20/21   [provider]  HYDROcodone-acetaminophen (NORCO/VICODIN) 5-325 MG tablet Take 1-2 tablets by mouth every  4 (four) hours as needed for moderate pain (pain score 4-6). 08/26/19   Swinteck, Aaron Edelman, MD  hydroxypropyl methylcellulose / hypromellose (ISOPTO TEARS / GONIOVISC) 2.5 % ophthalmic solution Place 1 drop into both eyes 3 (three) times daily.     [provider]  hydrOXYzine (ATARAX/VISTARIL) 10 MG tablet Take 10 mg by mouth daily at 12 noon. 11/08/20   [provider]  LACTAID 3000 units tablet Take 3,000 Units by mouth as needed for dry skin. 01/25/20   [provider]  latanoprost (XALATAN) 0.005 % ophthalmic solution Place 1 drop into both eyes at bedtime.    [provider]  levothyroxine (SYNTHROID, LEVOTHROID) 75 MCG tablet Take 75 mcg by mouth daily before breakfast.    [provider]  lisinopril (PRINIVIL,ZESTRIL) 20 MG tablet Take 20 mg by mouth daily.    [provider]  magnesium oxide (MAG-OX) 400 (241.3 Mg) MG tablet Take 1 tablet (400 mg total) by mouth daily. 02/06/17   Reyne Dumas, MD  metoprolol succinate (TOPROL-XL) 50 MG 24 hr tablet Take 50 mg by mouth every evening. Take with or immediately following a meal.     [provider]  nitroGLYCERIN (NITROSTAT) 0.4 MG SL tablet Place 0.4 mg under the tongue every 5 (five) minutes as needed for chest pain.    [provider]  omeprazole (PRILOSEC) 40 MG capsule Take 40 mg by mouth  daily.    [provider]  ondansetron (ZOFRAN) 4 MG tablet Take 1 tablet (4 mg total) by mouth every 6 (six) hours as needed for nausea. 08/26/19   Swinteck, Aaron Edelman, MD  polyethylene glycol (MIRALAX / GLYCOLAX) 17 g packet Take 17 g by mouth daily as needed for mild constipation.     [provider]  primidone (MYSOLINE) 50 MG tablet Take 50 mg by mouth 4 (four) times daily.  07/07/18   [provider]  risperiDONE (RISPERDAL) 0.25 MG tablet Take 0.125 mg by mouth 2 (two) times daily.  02/24/19   [provider]  rOPINIRole (REQUIP) 0.5 MG tablet Take 0.5 mg by mouth daily. 11/09/20   [provider]  senna (SENOKOT) 8.6 MG tablet Take 1 tablet by mouth as needed for constipation.    [provider]  sertraline (ZOLOFT) 100 MG tablet Take 100 mg by mouth at bedtime.  09/24/18   [provider]  Vitamin D, Ergocalciferol, (DRISDOL) 1.25 MG (50000 UT) CAPS capsule Take 50,000 Units by mouth once a week. 06/30/19   [provider]    Physical Exam: BP (!) 127/56   Pulse 67   Temp 97.7 F (36.5 C) (Oral)   Resp 13   Ht '4\' 11"'$  (1.499 m)   Wt 72.6 kg   SpO2 94%   BMI 32.32 kg/m   General: 87 y.o. year-old female well developed well nourished in no acute distress.  Alert and oriented x2. Cardiovascular: Regular rate and rhythm with no rubs or gallops.  No thyromegaly or JVD noted.  No lower extremity edema. 2/4 pulses in all 4 extremities. Respiratory: Mild rales at bases with no wheezing noted.  Poor inspiratory effort. Abdomen: Soft nontender nondistended with normal bowel sounds x4 quadrants. Muskuloskeletal: No cyanosis, clubbing or edema noted bilaterally Neuro: CN II-XII intact, strength, sensation, reflexes Skin: No ulcerative lesions noted or rashes Psychiatry: Judgement and insight appear mildly altered in the setting of mild dementia. Mood is appropriate for condition and setting          Labs  on Admission:   Basic Metabolic Panel: Recent Labs  Lab 04/29/21 1212  NA 137  K 4.1  CL 103  CO2 28  GLUCOSE 101*  BUN 10  CREATININE 1.09*  CALCIUM 8.3*   Liver Function Tests: No results for input(s): AST, ALT, ALKPHOS, BILITOT, PROT, ALBUMIN in the last 168 hours. No results for input(s): LIPASE, AMYLASE in the last 168 hours. No results for input(s): AMMONIA in the last 168 hours. CBC: Recent Labs  Lab 04/29/21 1212  WBC 7.1  HGB 12.7  HCT 40.3  MCV 109.5*  PLT 330   Cardiac Enzymes: No results for input(s): CKTOTAL, CKMB, CKMBINDEX, TROPONINI in the last 168 hours.  BNP (last 3 results) No results for input(s): BNP in the last 8760 hours.  ProBNP (last 3 results) No results for input(s): PROBNP in the last 8760 hours.  CBG: No results for input(s): GLUCAP in the last 168 hours.  Radiological Exams on Admission: DG Chest 2 View  Result Date: 04/29/2021 CLINICAL DATA:  Chest pain. EXAM: CHEST - 2 VIEW COMPARISON:  Feb 07, 2017. FINDINGS: The heart size and mediastinal contours are within normal limits. Minimal bibasilar subsegmental atelectasis is noted. The visualized skeletal structures are unremarkable. IMPRESSION: Minimal bibasilar subsegmental atelectasis. Electronically Signed   By: Marijo Conception M.D.   On: 04/29/2021 13:52   CT Angio Chest PE W and/or Wo Contrast  Result Date: 04/29/2021 CLINICAL DATA:  87 year old female with concern for pulmonary embolism. EXAM: CT ANGIOGRAPHY CHEST WITH CONTRAST TECHNIQUE: Multidetector CT imaging of the chest was performed using the standard protocol during bolus administration of intravenous contrast. Multiplanar CT image reconstructions and MIPs were obtained to evaluate the vascular anatomy. CONTRAST:  37m OMNIPAQUE IOHEXOL 350 MG/ML SOLN COMPARISON:  Chest CT dated 02/04/2017 and radiograph dated 04/29/2021. FINDINGS: Cardiovascular: There is no cardiomegaly or pericardial effusion. There is coronary vascular calcification. Mild  atherosclerotic calcification of the thoracic aorta. No aneurysmal dilatation. No pulmonary artery embolus identified. Mediastinum/Nodes: There is no hilar or mediastinal adenopathy. The esophagus is grossly unremarkable. No mediastinal fluid collection. Lungs/Pleura: Bibasilar subpleural linear atelectasis/scarring. No focal consolidation, pleural effusion, or pneumothorax. The central airways are patent. Upper Abdomen: No acute abnormality. Musculoskeletal: Osteopenia with degenerative changes of the spine. No acute osseous pathology. Review of the MIP images confirms the above findings. IMPRESSION: No acute intrathoracic pathology. No CT evidence of pulmonary embolism. Electronically Signed   By: AAnner CreteM.D.   On: 04/29/2021 22:34    EKG: I independently viewed the EKG done and my findings are as followed: Sinus rhythm rate of 65, sinus arrhythmia, first-degree AV block.  QTc 449.  Assessment/Plan Present on Admission:  Acute respiratory failure with hypoxia (HCC)  Active Problems:   Acute respiratory failure with hypoxia (HCC)  Acute hypoxic respiratory failure likely secondary to bibasilar atelectasis and scarring seen on CT scan She had a CTA chest done which was negative for PE, no evidence of pneumonia or pulmonary edema, personally reviewed.  Scarring and bibasilar atelectasis noted. Maintain O2 saturation greater than 92% DuoNebs every 6 hours Incentive spirometer Home oxygen evaluation on 04/30/2021 DME oxygen ordered, 2 L nasal cannula. TOC consult for DME oxygen and DC planning to return back to ALF Recommend outpatient follow-up with pulmonary  Chest pain, unspecified Patient could not elaborate on her chest pain  Symptoms resolved at time of this visit. She received sublingual nitroglycerin and a full dose aspirin by EMS. Troponins negative x2, no evidence of acute ischemia  on twelve-lead EKG. Monitor overnight on telemetry  CKD 3B She appears to be close to her  baseline Presented with creatinine 1.09 with GFR 49 Avoid nephrotoxic agents, dehydration and hypotension Monitor urine output Repeat BMP in the morning  Mild dementia Reorient as needed  Hypothyroidism Resume home levothyroxine.  Chronic anxiety/depression Resume home duloxetine   DVT prophylaxis: Subcu Lovenox daily.  Code Status: DNR, she had a DNR form in the room at the time of this visit.  Family Communication: Called her husband, no answer.  Disposition Plan: Admit to MedSurg unit with remote telemetry.  Consults called: None  Admission status: Observation status.   Status is: Observation    Dispo:  Patient From: [facility]  Planned Disposition: ALF, possibly on 05/01/2021  Medically stable for discharge: No      Kayleen Memos MD [facility] Pager [phone]  If 7PM-7AM, please contact night-coverage [email or web address] Password [facility]  04/29/2021, 11:39 PM

## 2021-04-30 ENCOUNTER — Observation Stay (HOSPITAL_COMMUNITY): Payer: Medicare Other

## 2021-04-30 ENCOUNTER — Observation Stay (HOSPITAL_BASED_OUTPATIENT_CLINIC_OR_DEPARTMENT_OTHER): Payer: Medicare Other

## 2021-04-30 DIAGNOSIS — R0609 Other forms of dyspnea: Secondary | ICD-10-CM

## 2021-04-30 DIAGNOSIS — J9601 Acute respiratory failure with hypoxia: Secondary | ICD-10-CM | POA: Diagnosis not present

## 2021-04-30 LAB — MAGNESIUM: Magnesium: 1.9 mg/dL (ref 1.7–2.4)

## 2021-04-30 LAB — BASIC METABOLIC PANEL
Anion gap: 8 (ref 5–15)
BUN: 10 mg/dL (ref 8–23)
CO2: 28 mmol/L (ref 22–32)
Calcium: 8.3 mg/dL — ABNORMAL LOW (ref 8.9–10.3)
Chloride: 103 mmol/L (ref 98–111)
Creatinine, Ser: 0.94 mg/dL (ref 0.44–1.00)
GFR, Estimated: 59 mL/min — ABNORMAL LOW (ref >60)
Glucose, Bld: 130 mg/dL — ABNORMAL HIGH (ref 70–99)
Potassium: 3.9 mmol/L (ref 3.5–5.1)
Sodium: 139 mmol/L (ref 135–145)

## 2021-04-30 LAB — ECHOCARDIOGRAM COMPLETE
Area-P 1/2: 3.17 cm2
Height: 59 in
S' Lateral: 3.2 cm
Weight: 2560 oz

## 2021-04-30 LAB — CBC
HCT: 37.9 % (ref 36.0–46.0)
Hemoglobin: 11.9 g/dL — ABNORMAL LOW (ref 12.0–15.0)
MCH: 34.2 pg — ABNORMAL HIGH (ref 26.0–34.0)
MCHC: 31.4 g/dL (ref 30.0–36.0)
MCV: 108.9 fL — ABNORMAL HIGH (ref 80.0–100.0)
Platelets: 279 10*3/uL (ref 150–400)
RBC: 3.48 MIL/uL — ABNORMAL LOW (ref 3.87–5.11)
RDW: 16.4 % — ABNORMAL HIGH (ref 11.5–15.5)
WBC: 6 10*3/uL (ref 4.0–10.5)
nRBC: 0 % (ref 0.0–0.2)

## 2021-04-30 LAB — PHOSPHORUS: Phosphorus: 3 mg/dL (ref 2.5–4.6)

## 2021-04-30 MED ORDER — ACETAMINOPHEN 325 MG PO TABS: 650.0000 mg | ORAL_TABLET | Freq: Four times a day (QID) | ORAL | Status: DC | PRN

## 2021-04-30 MED ORDER — LEVOTHYROXINE SODIUM 75 MCG PO TABS
75.0000 ug | ORAL_TABLET | Freq: Every day | ORAL | Status: DC
  Administered 2021-04-30: 75 ug via ORAL
  Filled 2021-04-30: qty 1

## 2021-04-30 MED ORDER — IPRATROPIUM-ALBUTEROL 0.5-2.5 (3) MG/3ML IN SOLN
3.0000 mL | Freq: Four times a day (QID) | RESPIRATORY_TRACT | Status: DC
  Administered 2021-04-30 (×3): 3 mL via RESPIRATORY_TRACT
  Filled 2021-04-30 (×4): qty 3

## 2021-04-30 MED ORDER — POTASSIUM CHLORIDE CRYS ER 20 MEQ PO TBCR
20.0000 meq | EXTENDED_RELEASE_TABLET | Freq: Once | ORAL | Status: AC
  Administered 2021-04-30: 20 meq via ORAL
  Filled 2021-04-30: qty 1

## 2021-04-30 MED ORDER — PERFLUTREN LIPID MICROSPHERE
1.0000 mL | INTRAVENOUS | Status: AC | PRN
  Administered 2021-04-30: 2 mL via INTRAVENOUS
  Filled 2021-04-30: qty 10

## 2021-04-30 MED ORDER — GABAPENTIN 300 MG PO CAPS
300.0000 mg | ORAL_CAPSULE | Freq: Three times a day (TID) | ORAL | Status: DC
  Administered 2021-04-30 – 2021-05-02 (×7): 300 mg via ORAL
  Filled 2021-04-30 (×7): qty 1

## 2021-04-30 MED ORDER — ASPIRIN EC 81 MG PO TBEC
81.0000 mg | DELAYED_RELEASE_TABLET | Freq: Every day | ORAL | Status: DC
  Administered 2021-04-30 – 2021-05-02 (×3): 81 mg via ORAL
  Filled 2021-04-30 (×3): qty 1

## 2021-04-30 MED ORDER — MELATONIN 3 MG PO TABS: 3.0000 mg | ORAL_TABLET | Freq: Every evening | ORAL | Status: DC | PRN

## 2021-04-30 MED ORDER — METOPROLOL TARTRATE 25 MG PO TABS
25.0000 mg | ORAL_TABLET | Freq: Three times a day (TID) | ORAL | Status: DC
  Administered 2021-04-30 – 2021-05-01 (×3): 25 mg via ORAL
  Filled 2021-04-30 (×3): qty 1

## 2021-04-30 MED ORDER — METOPROLOL TARTRATE 5 MG/5ML IV SOLN: 5.0000 mg | Freq: Four times a day (QID) | INTRAVENOUS | Status: DC

## 2021-04-30 MED ORDER — RISPERIDONE 0.25 MG PO TABS
0.1250 mg | ORAL_TABLET | Freq: Two times a day (BID) | ORAL | Status: DC
  Administered 2021-04-30 – 2021-05-02 (×5): 0.125 mg via ORAL
  Filled 2021-04-30 (×6): qty 0.5

## 2021-04-30 MED ORDER — MAGNESIUM SULFATE 2 GM/50ML IV SOLN
2.0000 g | Freq: Once | INTRAVENOUS | Status: AC
  Administered 2021-04-30: 2 g via INTRAVENOUS
  Filled 2021-04-30: qty 50

## 2021-04-30 MED ORDER — HYDROXYZINE HCL 10 MG PO TABS
10.0000 mg | ORAL_TABLET | Freq: Every day | ORAL | Status: DC
  Administered 2021-04-30 – 2021-05-01 (×2): 10 mg via ORAL
  Filled 2021-04-30 (×3): qty 1

## 2021-04-30 MED ORDER — DULOXETINE HCL 60 MG PO CPEP
120.0000 mg | ORAL_CAPSULE | Freq: Every morning | ORAL | Status: DC
  Administered 2021-04-30 – 2021-05-02 (×3): 120 mg via ORAL
  Filled 2021-04-30 (×3): qty 2

## 2021-04-30 MED ORDER — SERTRALINE HCL 100 MG PO TABS
100.0000 mg | ORAL_TABLET | Freq: Every day | ORAL | Status: DC
  Administered 2021-04-30 – 2021-05-01 (×2): 100 mg via ORAL
  Filled 2021-04-30 (×2): qty 1

## 2021-04-30 MED ORDER — CLONAZEPAM 0.5 MG PO TABS
0.5000 mg | ORAL_TABLET | Freq: Every day | ORAL | Status: DC
  Administered 2021-04-30 – 2021-05-01 (×2): 0.5 mg via ORAL
  Filled 2021-04-30 (×2): qty 1

## 2021-04-30 MED ORDER — BUPROPION HCL ER (XL) 150 MG PO TB24
300.0000 mg | ORAL_TABLET | Freq: Every morning | ORAL | Status: DC
  Administered 2021-04-30 – 2021-05-02 (×3): 300 mg via ORAL
  Filled 2021-04-30: qty 1
  Filled 2021-04-30 (×2): qty 2

## 2021-04-30 MED ORDER — PRIMIDONE 50 MG PO TABS
50.0000 mg | ORAL_TABLET | Freq: Four times a day (QID) | ORAL | Status: DC
  Administered 2021-04-30 – 2021-05-02 (×7): 50 mg via ORAL
  Filled 2021-04-30 (×11): qty 1

## 2021-04-30 MED ORDER — LACTATED RINGERS IV SOLN: INTRAVENOUS | Status: DC

## 2021-04-30 MED ORDER — DILTIAZEM HCL ER COATED BEADS 120 MG PO CP24
120.0000 mg | ORAL_CAPSULE | Freq: Every day | ORAL | Status: DC
  Administered 2021-04-30 – 2021-05-02 (×3): 120 mg via ORAL
  Filled 2021-04-30 (×3): qty 1

## 2021-04-30 MED ORDER — ONDANSETRON HCL 4 MG/2ML IJ SOLN: 4.0000 mg | Freq: Four times a day (QID) | INTRAMUSCULAR | Status: DC | PRN

## 2021-04-30 MED ORDER — METOPROLOL TARTRATE 5 MG/5ML IV SOLN
5.0000 mg | Freq: Once | INTRAVENOUS | Status: AC
  Administered 2021-04-30: 5 mg via INTRAVENOUS
  Filled 2021-04-30: qty 5

## 2021-04-30 MED ORDER — LORAZEPAM 2 MG/ML IJ SOLN
2.0000 mg | Freq: Once | INTRAMUSCULAR | Status: DC
  Filled 2021-04-30: qty 1

## 2021-04-30 MED ORDER — LATANOPROST 0.005 % OP SOLN
1.0000 [drp] | Freq: Every day | OPHTHALMIC | Status: DC
  Administered 2021-04-30 – 2021-05-01 (×2): 1 [drp] via OPHTHALMIC
  Filled 2021-04-30: qty 2.5

## 2021-04-30 MED ORDER — ROPINIROLE HCL 1 MG PO TABS
0.5000 mg | ORAL_TABLET | Freq: Every day | ORAL | Status: DC
  Administered 2021-05-01 – 2021-05-02 (×2): 0.5 mg via ORAL
  Filled 2021-04-30 (×2): qty 1

## 2021-04-30 MED ORDER — ATORVASTATIN CALCIUM 10 MG PO TABS
10.0000 mg | ORAL_TABLET | Freq: Every day | ORAL | Status: DC
  Administered 2021-04-30 – 2021-05-01 (×3): 10 mg via ORAL
  Filled 2021-04-30 (×3): qty 1

## 2021-04-30 MED ORDER — PANTOPRAZOLE SODIUM 40 MG PO TBEC
40.0000 mg | DELAYED_RELEASE_TABLET | Freq: Two times a day (BID) | ORAL | Status: DC
  Administered 2021-04-30 – 2021-05-02 (×4): 40 mg via ORAL
  Filled 2021-04-30 (×4): qty 1

## 2021-04-30 MED ORDER — DONEPEZIL HCL 10 MG PO TABS
5.0000 mg | ORAL_TABLET | Freq: Every evening | ORAL | Status: DC
  Administered 2021-04-30 – 2021-05-01 (×2): 5 mg via ORAL
  Filled 2021-04-30 (×2): qty 1

## 2021-04-30 NOTE — ED Notes (Signed)
Spoke with pt about leaving and per pt she does not have a way

## 2021-04-30 NOTE — ED Notes (Signed)
Ambulated to restroom with assistance.

## 2021-04-30 NOTE — Progress Notes (Signed)
[facility] Progress Note  Patient: Wendy Roth    [id number]  DOA: 04/29/2021     Date of Service: the patient was seen and examined on 04/30/2021  Brief hospital course: Past medical history of dementia, CAD, HTN, type II DM, anxiety, depression, hypothyroidism.  Presents with complaints of chest pain.  ACS was ruled out on initial screening. CT PE protocol was also performed which was negative for PE. Patient developed hypoxia and therefore was referred for admission. Patient developed SVT on 7/26. Currently plan is monitor on telemetry and ensure SVT is corrected.  Subjective: Denies any shortness of breath.  Denies any chest pain.  Primary complaint is abdominal pain and nausea.  No diarrhea.  No fever no chills.  Somewhat confused.  Try to leave AMA last night.  Assessment and Plan: 1.  Acute hypoxic respiratory failure secondary to atelectasis CTA negative for PE. No pneumonia or volume overload as well. Examination unrevealing for COPD exacerbation or bronchospasm. Currently on 2 LPM oxygen.  Will monitor.  Home oxygen evaluation likely tomorrow.  2.  SVT Chest pain CAD HTN HLD Started having SVT in the morning of 7/26.  Resolved with IV Lopressor.  Currently resuming home Cardizem.  Also continuing home Lopressor.  Monitor on telemetry to ensure that the SVT is corrected.  Hold Synthroid. We will also check echocardiogram. Blood pressure otherwise stable.  Diet controlled dyslipidemia.  Will monitor.  3.  Mood disorder, anxiety, depression. Dementia. Delirium. Patient is on Wellbutrin, Cymbalta, clonazepam, Aricept, gabapentin, hydroxyzine, risperidone on Requip and Zoloft. Suspect patient is at risk for polypharmacy. Recommend at least discussion regarding Wellbutrin Cymbalta and Zoloft and removal of 2 of the medication from this regimen. For now we will monitor and continue the regimen  4.  Chest pain Suspect GI in nature. Will continue PPI.  5.   Hypothyroidism Check TSH and free T4. Holding Synthroid for now but in the setting of SVT.  6.  Macrocytosis Check further work-up.  Scheduled Meds:  aspirin EC  81 mg Oral Daily   atorvastatin  10 mg Oral QHS   buPROPion  300 mg Oral q morning   clonazePAM  0.5 mg Oral QHS   diltiazem  120 mg Oral Daily   donepezil  5 mg Oral QPM   DULoxetine  120 mg Oral q morning   enoxaparin (LOVENOX) injection  40 mg Subcutaneous Q24H   gabapentin  300 mg Oral TID   hydrOXYzine  10 mg Oral Q1200   ipratropium-albuterol  3 mL Nebulization Q6H   latanoprost  1 drop Both Eyes QHS   metoprolol tartrate  25 mg Oral TID   pantoprazole  40 mg Oral BID AC   primidone  50 mg Oral QID   risperiDONE  0.125 mg Oral BID   rOPINIRole  0.5 mg Oral Daily   sertraline  100 mg Oral QHS   Continuous Infusions: PRN Meds: acetaminophen, melatonin, ondansetron (ZOFRAN) IV  Body mass index is 32.32 kg/m.        DVT Prophylaxis:   enoxaparin (LOVENOX) injection 40 mg Start: 04/30/21 1000 SCDs Start: 04/29/21 2341    Advance goals of care discussion: Pt is DNR.  Family Communication: no family was present at bedside, at the time of interview.   Data Reviewed: I have personally reviewed and interpreted daily labs, tele strips, imaging. Hemoglobin stable.  MCV of 109.  Serum creatinine stable.  Potassium 3.9.  Physical Exam:  General: Appear in mild distress, no Rash;  Oral Mucosa Clear, moist. no Abnormal Neck Mass Or lumps, Conjunctiva normal  Cardiovascular: S1 and S2 Present, no Murmur, Respiratory: good respiratory effort, Bilateral Air entry present and CTA, no Crackles, no wheezes Abdomen: Bowel Sound present, Soft and no tenderness Extremities: no Pedal edema Neurology: alert and oriented to time, place, and person affect appropriate. no new focal deficit Gait not checked due to patient safety concerns    Vitals:   04/30/21 1100 04/30/21 1200 04/30/21 1300 04/30/21 1611  BP: (!) 153/93  (!) 160/83 (!) 146/95 (!) 144/92  Pulse: (!) 160 75 88 84  Resp: '16 16 18 15  '$ Temp:    98.6 F (37 C)  TempSrc:    Oral  SpO2: 95% 94% 95% 94%  Weight:      Height:        Disposition:  Status is: Observation  The patient will require care spanning > 2 midnights and should be moved to inpatient because: Hemodynamically unstable  Dispo:  Patient From: [place]  Planned Disposition: ALF  Medically stable for discharge: No       Time spent: 35 minutes. I reviewed all nursing notes, pharmacy notes, vitals, pertinent old records. I have discussed plan of care as described above with RN.  Author: Berle Mull, MD [facility] 04/30/2021 6:23 PM  To reach On-call, see care teams to locate the attending and reach out via [email or web address]. Between 7PM-7AM, please contact night-coverage If you still have difficulty reaching the attending provider, please page the [facility] (Director on Call) for [facility] on amion for assistance.

## 2021-04-30 NOTE — ED Notes (Signed)
Found pt hanging off the side of the bed. Pt has removed her PIV and removed the monitor and stated that she was leaving. Pt then stated that she needed to go to the restroom. Pt was assisted to the restroom at this time by this RN

## 2021-04-30 NOTE — Progress Notes (Signed)
  Echocardiogram 2D Echocardiogram has been performed.  Wendy Roth 04/30/2021, 2:02 PM

## 2021-04-30 NOTE — ED Notes (Signed)
Contacted pt son reference pt wishing to leave and needing transportation to leave. Son is now speaking with pt

## 2021-04-30 NOTE — ED Notes (Signed)
Pt out of bed and had removed self from monitor. Pt placed back in bed, on monitor and oxygen back on. Educated pt on use of call light and asking for help when needing to get out of bed.

## 2021-04-30 NOTE — ED Notes (Signed)
Spoke with provider and was advised to let the pt leave AMA and to not administer the medication that she spoke with the son and per son the pt does not have Dementia and if the pt would like to leave she can leave.

## 2021-04-30 NOTE — ED Notes (Signed)
Breakfast Order Placed ?

## 2021-04-30 NOTE — ED Notes (Signed)
Provider paged reference pt requesting to leave.

## 2021-04-30 NOTE — ED Notes (Signed)
Admitting MD notified of pt. HR in the 170's

## 2021-04-30 NOTE — Progress Notes (Signed)
Received a call from bedside RN Christin Fudge regarding the patient wanting to leave AMA.  I called her husband but unsuccessfully then I called her son Leory Plowman [phone].  He stated that the patient does not have dementia and is able to make her own decisions.  He is aware that his mother is wanting to leave against medical advice.  Updated bedside RN.

## 2021-04-30 NOTE — ED Notes (Signed)
Report given to [place]

## 2021-04-30 NOTE — ED Notes (Signed)
Echo at the bedside °

## 2021-04-30 NOTE — ED Notes (Signed)
Per provider pt can not leave. She is going to put orders in for medication IM since pt removed her PIV.

## 2021-05-01 DIAGNOSIS — J9601 Acute respiratory failure with hypoxia: Secondary | ICD-10-CM | POA: Diagnosis not present

## 2021-05-01 LAB — BASIC METABOLIC PANEL
Anion gap: 3 — ABNORMAL LOW (ref 5–15)
BUN: 10 mg/dL (ref 8–23)
CO2: 30 mmol/L (ref 22–32)
Calcium: 8.7 mg/dL — ABNORMAL LOW (ref 8.9–10.3)
Chloride: 103 mmol/L (ref 98–111)
Creatinine, Ser: 0.99 mg/dL (ref 0.44–1.00)
GFR, Estimated: 56 mL/min — ABNORMAL LOW (ref >60)
Glucose, Bld: 117 mg/dL — ABNORMAL HIGH (ref 70–99)
Potassium: 4.5 mmol/L (ref 3.5–5.1)
Sodium: 136 mmol/L (ref 135–145)

## 2021-05-01 LAB — CBC
HCT: 43.6 % (ref 36.0–46.0)
Hemoglobin: 14.2 g/dL (ref 12.0–15.0)
MCH: 34.5 pg — ABNORMAL HIGH (ref 26.0–34.0)
MCHC: 32.6 g/dL (ref 30.0–36.0)
MCV: 105.8 fL — ABNORMAL HIGH (ref 80.0–100.0)
Platelets: 226 10*3/uL (ref 150–400)
RBC: 4.12 MIL/uL (ref 3.87–5.11)
RDW: 16.5 % — ABNORMAL HIGH (ref 11.5–15.5)
WBC: 4.3 10*3/uL (ref 4.0–10.5)
nRBC: 0 % (ref 0.0–0.2)

## 2021-05-01 LAB — TSH: TSH: 3.551 u[IU]/mL (ref 0.350–4.500)

## 2021-05-01 LAB — T4, FREE: Free T4: 0.91 ng/dL (ref 0.61–1.12)

## 2021-05-01 LAB — MAGNESIUM: Magnesium: 2.3 mg/dL (ref 1.7–2.4)

## 2021-05-01 MED ORDER — METOPROLOL TARTRATE 25 MG PO TABS
25.0000 mg | ORAL_TABLET | Freq: Two times a day (BID) | ORAL | Status: DC
  Administered 2021-05-01 – 2021-05-02 (×2): 25 mg via ORAL
  Filled 2021-05-01 (×2): qty 1

## 2021-05-01 MED ORDER — ASPIRIN 81 MG PO TBEC: 81.0000 mg | DELAYED_RELEASE_TABLET | Freq: Every day | ORAL | 11 refills | Status: AC

## 2021-05-01 MED ORDER — IPRATROPIUM-ALBUTEROL 0.5-2.5 (3) MG/3ML IN SOLN: 3.0000 mL | Freq: Four times a day (QID) | RESPIRATORY_TRACT | Status: DC | PRN

## 2021-05-01 NOTE — Care Management Obs Status (Signed)
[place] NOTIFICATION   Patient Details  Name: Wendy Roth MRN: [id number] Date of Birth: [birth year]   Medicare Observation Status Notification Given:  Yes    Angelita Ingles, RN 05/01/2021, 12:32 PM

## 2021-05-01 NOTE — Discharge Summary (Signed)
[facility] Discharge Summary   Patient: Wendy Roth  PCP: Javier Glazier, MD  Date of admission: 04/29/2021   Date of discharge:  05/01/2021     Discharge Diagnoses:  Principal diagnosis Atelectasis  causing acute hypoxia Active Problems:   Acute respiratory failure with hypoxia ([place]) SVT  Admitted From: ALF Disposition:  ALF/ILF   Recommendations for Outpatient Follow-up:  PCP: please follow up with PCP in 1 week   Follow-up Information     Javier Glazier, MD. Schedule an appointment as soon as possible for a visit in 1 week(s).   Specialty: Internal Medicine Contact information: [address] [place] [id number] [phone]                Discharge Instructions     Diet - low sodium heart healthy   Complete by: As directed    Increase activity slowly   Complete by: As directed        Diet recommendation: Cardiac diet  Activity: The patient is advised to gradually reintroduce usual activities, as tolerated  Discharge Condition: stable  Code Status: DNR   History of present illness: As per the H and P dictated on admission, "Wendy Roth is a 87 y.o. female with medical history significant for mild dementia, coronary artery disease, essential hypertension, type 2 diabetes, chronic anxiety/depression, hypothyroidism, who presented [facility] ED from assisted living facility with complaints of chest pain.  Nonradiating.  She is a poor historian.  Called her husband to obtain more information but unsuccessfully, no answer.  Majority of the history is obtained from EDP and review of medical records.  Patient had a dream that she was having severe chest pain and woke up having chest pain.  Nursing staff was called to the room and on vitals signs she was noted to be tachycardic and had some increase work of breathing with complaints of lightheadedness.  EMS was activated.  She was given sublingual nitroglycerin and a full dose  aspirin with resolution of her chest pain.  On EDPs evaluation she was noted to be hypoxic with O2 saturation of 88% on room air.  Not on oxygen supplementation at baseline.  She had a CTA chest evaluation which was negative for PE.  No evidence of pneumonia or pulmonary edema however showed atelectasis and scarring.  EDP requested admission due to acute hypoxic respiratory failure.  Patient was admitted under hospitalist service.   ED Course: Temperature, T-max 99.5.  BP 134/55, pulse 67, respiratory rate 13, O2 saturation 84% on room air.  Lab studies remarkable for creatinine 1.09 with GFR 49.  CBC was unremarkable.  "  Hospital Course:  Summary of her active problems in the hospital is as following. 1.  Acute hypoxic respiratory failure secondary to atelectasis CTA negative for PE. No pneumonia or volume overload as well. Examination unrevealing for COPD exacerbation or bronchospasm. Likely atelectasis.    2.  SVT Chest pain CAD HTN HLD Started having SVT in the morning of 7/26.  Resolved with IV Lopressor.  Currently resuming home Cardizem.  Also continuing home Lopressor. TSH and free T4 normal. Continue Synthroid. Echocardiogram was showing normal EF and no valvular abnormality  Blood pressure otherwise stable.   Diet controlled dyslipidemia.  Will monitor.   3.  Mood disorder, anxiety, depression. Dementia. Delirium. Patient is on Wellbutrin, Cymbalta, clonazepam, Aricept, gabapentin, hydroxyzine, risperidone on Requip and Zoloft. Suspect patient is at risk for polypharmacy. Recommend follow up with PCP and  at least discussion regarding Wellbutrin Cymbalta and Zoloft and removal of 2 of the medication from this regimen. For now we will monitor and continue the regimen   4.  Chest pain Suspect GI in nature. Resolved, EKG and troponin negative for acute ischemia  Will continue PPI.   5.  Hypothyroidism Normal TSH and free T4. Resume Synthroid   6.  Macrocytosis Check  further work-up outpatient. No anemia.   7. Obesity: Placing the pt at risk of poor outcome and also at risk of OSA.  Body mass index is 32.32 kg/m.   Patient was ambulatory without any assistance. On the day of the discharge the patient's vitals were stable, and no other new acute medical condition were reported. The patient was felt safe to be discharge at ALF/ILF with Home health.  Consultants: none Procedures: Echocardiogram   DISCHARGE MEDICATION: Allergies as of 05/01/2021       Reactions   Cortisone Shortness Of Breath   She blames symptoms of SOB & palpitations on shot of cortisone.        Medication List     STOP taking these medications    lisinopril 20 MG tablet Commonly known as: ZESTRIL       TAKE these medications    acetaminophen 500 MG tablet Commonly known as: TYLENOL Take 500 mg by mouth 3 (three) times daily. Morning, Noon, Evening (Scheduled)   aspirin 81 MG EC tablet Take 1 tablet (81 mg total) by mouth daily. Swallow whole. Start taking on: May 02, 2021   atorvastatin 10 MG tablet Commonly known as: LIPITOR Take 10 mg by mouth at bedtime.   buPROPion 300 MG 24 hr tablet Commonly known as: WELLBUTRIN XL Take 300 mg by mouth every morning.   clonazePAM 0.5 MG tablet Commonly known as: KLONOPIN Take 0.5 mg by mouth at bedtime.   diltiazem 120 MG 24 hr capsule Commonly known as: Cardizem CD Take 1 capsule (120 mg total) by mouth daily.   donepezil 5 MG tablet Commonly known as: ARICEPT Take 5 mg by mouth every evening.   DULoxetine 60 MG capsule Commonly known as: CYMBALTA Take 120 mg by mouth every morning.   gabapentin 300 MG capsule Commonly known as: NEURONTIN Take 300 mg by mouth 3 (three) times daily.   HYDROcodone-acetaminophen 5-325 MG tablet Commonly known as: NORCO/VICODIN Take 1-2 tablets by mouth every 4 (four) hours as needed for moderate pain (pain score 4-6).   hydroxypropyl methylcellulose / hypromellose 2.5  % ophthalmic solution Commonly known as: ISOPTO TEARS / GONIOVISC Place 1 drop into both eyes 3 (three) times daily.   hydrOXYzine 10 MG tablet Commonly known as: ATARAX/VISTARIL Take 10 mg by mouth at bedtime.   Lactaid 3000 units tablet Generic drug: lactase Take 3,000 Units by mouth as needed for dry skin.   latanoprost 0.005 % ophthalmic solution Commonly known as: XALATAN Place 1 drop into both eyes at bedtime.   levothyroxine 75 MCG tablet Commonly known as: SYNTHROID Take 75 mcg by mouth daily before breakfast.   magnesium oxide 400 (241.3 Mg) MG tablet Commonly known as: MAG-OX Take 1 tablet (400 mg total) by mouth daily.   metoprolol succinate 50 MG 24 hr tablet Commonly known as: TOPROL-XL Take 50 mg by mouth every evening. Take with or immediately following a meal.   nitroGLYCERIN 0.4 MG SL tablet Commonly known as: NITROSTAT Place 0.4 mg under the tongue every 5 (five) minutes as needed for chest pain.   omeprazole 40 MG  capsule Commonly known as: PRILOSEC Take 40 mg by mouth daily.   ondansetron 4 MG tablet Commonly known as: ZOFRAN Take 1 tablet (4 mg total) by mouth every 6 (six) hours as needed for nausea.   polyethylene glycol 17 g packet Commonly known as: MIRALAX / GLYCOLAX Take 17 g by mouth daily as needed for mild constipation.   primidone 50 MG tablet Commonly known as: MYSOLINE Take 50 mg by mouth 4 (four) times daily.   risperiDONE 0.25 MG tablet Commonly known as: RISPERDAL Take 0.125 mg by mouth 2 (two) times daily.   rOPINIRole 0.5 MG tablet Commonly known as: REQUIP Take 0.5 mg by mouth daily.   senna 8.6 MG tablet Commonly known as: SENOKOT Take 1 tablet by mouth as needed for constipation.   sertraline 100 MG tablet Commonly known as: ZOLOFT Take 100 mg by mouth at bedtime.   Vitamin D (Ergocalciferol) 1.25 MG (50000 UNIT) Caps capsule Commonly known as: DRISDOL Take 50,000 Units by mouth every 14 (fourteen) days.                Durable Medical Equipment  (From admission, onward)           Start     Ordered   04/30/21 0013  For home use only DME oxygen  Once       Question Answer Comment  Length of Need 6 Months   Mode or (Route) Nasal cannula   Liters per Minute 2   Frequency Continuous (stationary and portable oxygen unit needed)   Oxygen conserving device Yes   Oxygen delivery system Gas      04/30/21 0012            Discharge Exam: [facility] Weights   04/29/21 2011  Weight: 72.6 kg   Vitals:   04/30/21 2203 05/01/21 0548  BP: (!) 146/74 (!) 137/48  Pulse: 65 (!) 48  Resp: 19 19  Temp: 98 F (36.7 C) 97.8 F (36.6 C)  SpO2: 91% (!) 88%   General: Appear in mild distress, no Rash; Oral Mucosa Clear, moist. no Abnormal Neck Mass Or lumps, Conjunctiva normal  Cardiovascular: S1 and S2 Present, no Murmur, Respiratory: good respiratory effort, Bilateral Air entry present and CTA, no Crackles, no wheezes Abdomen: Bowel Sound present, Soft and no tenderness Extremities: no Pedal edema Neurology: alert and oriented to time, place, and person affect appropriate. no new focal deficit Gait not checked due to patient safety concerns  The results of significant diagnostics from this hospitalization (including imaging, microbiology, ancillary and laboratory) are listed below for reference.    Significant Diagnostic Studies: DG Chest 2 View  Result Date: 04/29/2021 CLINICAL DATA:  Chest pain. EXAM: CHEST - 2 VIEW COMPARISON:  Feb 07, 2017. FINDINGS: The heart size and mediastinal contours are within normal limits. Minimal bibasilar subsegmental atelectasis is noted. The visualized skeletal structures are unremarkable. IMPRESSION: Minimal bibasilar subsegmental atelectasis. Electronically Signed   By: Marijo Conception M.D.   On: 04/29/2021 13:52   DG Abd 1 View  Result Date: 04/30/2021 CLINICAL DATA:  Abdominal pain EXAM: ABDOMEN - 1 VIEW COMPARISON:  08/18/2017 FINDINGS:  Nonobstructive bowel gas pattern. No radio-opaque calculi or other significant radiographic abnormality are seen. Multilevel degenerative lumbar spondylosis. Aortic atherosclerosis. A IMPRESSION: Negative. Electronically Signed   By: Davina Poke D.O.   On: 04/30/2021 12:07   CT Angio Chest PE W and/or Wo Contrast  Result Date: 04/29/2021 CLINICAL DATA:  87 year old female with concern for pulmonary  embolism. EXAM: CT ANGIOGRAPHY CHEST WITH CONTRAST TECHNIQUE: Multidetector CT imaging of the chest was performed using the standard protocol during bolus administration of intravenous contrast. Multiplanar CT image reconstructions and MIPs were obtained to evaluate the vascular anatomy. CONTRAST:  55m OMNIPAQUE IOHEXOL 350 MG/ML SOLN COMPARISON:  Chest CT dated 02/04/2017 and radiograph dated 04/29/2021. FINDINGS: Cardiovascular: There is no cardiomegaly or pericardial effusion. There is coronary vascular calcification. Mild atherosclerotic calcification of the thoracic aorta. No aneurysmal dilatation. No pulmonary artery embolus identified. Mediastinum/Nodes: There is no hilar or mediastinal adenopathy. The esophagus is grossly unremarkable. No mediastinal fluid collection. Lungs/Pleura: Bibasilar subpleural linear atelectasis/scarring. No focal consolidation, pleural effusion, or pneumothorax. The central airways are patent. Upper Abdomen: No acute abnormality. Musculoskeletal: Osteopenia with degenerative changes of the spine. No acute osseous pathology. Review of the MIP images confirms the above findings. IMPRESSION: No acute intrathoracic pathology. No CT evidence of pulmonary embolism. Electronically Signed   By: AAnner CreteM.D.   On: 04/29/2021 22:34   ECHOCARDIOGRAM COMPLETE  Result Date: 04/30/2021    ECHOCARDIOGRAM REPORT   Patient Name:   PARIENNE DEURDate of Exam: 04/30/2021 Medical Rec #:  [id number]          Height:       59.0 in Accession #:    [id number]         Weight:        160.0 lb Date of Birth:  [birth year]          BSA:          1.677 m Patient Age:    89years            BP:           153/93 mmHg Patient Gender: F                   HR:           67 bpm. Exam Location:  Inpatient Procedure: 2D Echo, Cardiac Doppler, Color Doppler and Intracardiac            Opacification Agent Indications:    Dyspnea R06.00  History:        Patient has prior history of Echocardiogram examinations, most                 recent 02/05/2017. CAD; Risk Factors:Hypertension, Diabetes and                 Dyslipidemia.  Sonographer:    SBernadene PersonRDCS Referring Phys: [id number] 1. Moderate septal hypertrophy with otherwise mild concentric hypertrophy. Left ventricular ejection fraction, by estimation, is 55 to 60%. The left ventricle has normal function. The left ventricle has no regional wall motion abnormalities. There is moderate left ventricular hypertrophy. Left ventricular diastolic parameters are consistent with Grade I diastolic dysfunction (impaired relaxation). Elevated left ventricular end-diastolic pressure.  2. Right ventricular systolic function is normal. The right ventricular size is normal.  3. The mitral valve is normal in structure. No evidence of mitral valve regurgitation. No evidence of mitral stenosis.  4. The aortic valve is tricuspid. Aortic valve regurgitation is not visualized. No aortic stenosis is present.  5. Aortic dilatation noted. There is borderline dilatation of the aortic root, measuring 36 mm.  6. The inferior vena cava is normal in size with greater than 50% respiratory variability, suggesting right atrial pressure of 3 mmHg. FINDINGS  Left Ventricle: Moderate septal hypertrophy with  otherwise mild concentric hypertrophy. Left ventricular ejection fraction, by estimation, is 55 to 60%. The left ventricle has normal function. The left ventricle has no regional wall motion abnormalities. Definity contrast agent was given IV to delineate the left  ventricular endocardial borders. The left ventricular internal cavity size was normal in size. There is moderate left ventricular hypertrophy. Left ventricular diastolic parameters  are consistent with Grade I diastolic dysfunction (impaired relaxation). Elevated left ventricular end-diastolic pressure. Right Ventricle: The right ventricular size is normal. No increase in right ventricular wall thickness. Right ventricular systolic function is normal. Left Atrium: Left atrial size was normal in size. Right Atrium: Right atrial size was normal in size. Pericardium: There is no evidence of pericardial effusion. Mitral Valve: The mitral valve is normal in structure. No evidence of mitral valve regurgitation. No evidence of mitral valve stenosis. Tricuspid Valve: The tricuspid valve is normal in structure. Tricuspid valve regurgitation is not demonstrated. No evidence of tricuspid stenosis. Aortic Valve: The aortic valve is tricuspid. Aortic valve regurgitation is not visualized. No aortic stenosis is present. Pulmonic Valve: The pulmonic valve was normal in structure. Pulmonic valve regurgitation is not visualized. No evidence of pulmonic stenosis. Aorta: Aortic dilatation noted. There is borderline dilatation of the aortic root, measuring 36 mm. Venous: The inferior vena cava is normal in size with greater than 50% respiratory variability, suggesting right atrial pressure of 3 mmHg. IAS/Shunts: No atrial level shunt detected by color flow Doppler.  LEFT VENTRICLE PLAX 2D LVIDd:         4.60 cm  Diastology LVIDs:         3.20 cm  LV e' medial:    4.14 cm/s LV PW:         1.20 cm  LV E/e' medial:  17.1 LV IVS:        1.40 cm  LV e' lateral:   5.68 cm/s LVOT diam:     2.30 cm  LV E/e' lateral: 12.5 LV SV:         82 LV SV Index:   49 LVOT Area:     4.15 cm  RIGHT VENTRICLE RV S prime:     9.32 cm/s TAPSE (M-mode): 1.8 cm LEFT ATRIUM             Index       RIGHT ATRIUM           Index LA diam:        3.30 cm 1.97 cm/m   RA Area:     11.60 cm LA Vol (A2C):   24.9 ml 14.84 ml/m RA Volume:   21.20 ml  12.64 ml/m LA Vol (A4C):   31.1 ml 18.54 ml/m LA Biplane Vol: 29.3 ml 17.47 ml/m  AORTIC VALVE LVOT Vmax:   92.20 cm/s LVOT Vmean:  60.600 cm/s LVOT VTI:    0.198 m  AORTA Ao Root diam: 3.60 cm Ao Asc diam:  3.40 cm MITRAL VALVE MV Area (PHT): 3.17 cm     SHUNTS MV Decel Time: 239 msec     Systemic VTI:  0.20 m MV E velocity: 70.80 cm/s   Systemic Diam: 2.30 cm MV A velocity: 100.00 cm/s MV E/A ratio:  0.71 Skeet Latch MD Electronically signed by Skeet Latch MD Signature Date/Time: 04/30/2021/4:44:11 PM    Final     Microbiology: Recent Results (from the past 240 hour(s))  Resp Panel by RT-PCR (Flu A&B, Covid) Nasopharyngeal Swab     Status: None   Collection  Time: 04/29/21  8:54 PM   Specimen: Nasopharyngeal Swab; Nasopharyngeal(NP) swabs in vial transport medium  Result Value Ref Range Status   SARS Coronavirus 2 by RT PCR NEGATIVE NEGATIVE Final    Comment: (NOTE) SARS-CoV-2 target nucleic acids are NOT DETECTED.  The SARS-CoV-2 RNA is generally detectable in upper respiratory specimens during the acute phase of infection. The lowest concentration of SARS-CoV-2 viral copies this assay can detect is 138 copies/mL. A negative result does not preclude SARS-Cov-2 infection and should not be used as the sole basis for treatment or other patient management decisions. A negative result may occur with  improper specimen collection/handling, submission of specimen other than nasopharyngeal swab, presence of viral mutation(s) within the areas targeted by this assay, and inadequate number of viral copies(<138 copies/mL). A negative result must be combined with clinical observations, patient history, and epidemiological information. The expected result is Negative.  Fact Sheet for Patients:  EntrepreneurPulse.com.au  Fact Sheet for Healthcare Providers:   IncredibleEmployment.be  This test is no t yet approved or cleared by the [place] FDA and  has been authorized for detection and/or diagnosis of SARS-CoV-2 by FDA under an Emergency Use Authorization (EUA). This EUA will remain  in effect (meaning this test can be used) for the duration of the COVID-19 declaration under Section 564(b)(1) of the Act, 21 U.S.C.section 360bbb-3(b)(1), unless the authorization is terminated  or revoked sooner.       Influenza A by PCR NEGATIVE NEGATIVE Final   Influenza B by PCR NEGATIVE NEGATIVE Final    Comment: (NOTE) The Xpert Xpress SARS-CoV-2/FLU/RSV plus assay is intended as an aid in the diagnosis of influenza from Nasopharyngeal swab specimens and should not be used as a sole basis for treatment. Nasal washings and aspirates are unacceptable for Xpert Xpress SARS-CoV-2/FLU/RSV testing.  Fact Sheet for Patients: EntrepreneurPulse.com.au  Fact Sheet for Healthcare Providers: IncredibleEmployment.be  This test is not yet approved or cleared by the [place] FDA and has been authorized for detection and/or diagnosis of SARS-CoV-2 by FDA under an Emergency Use Authorization (EUA). This EUA will remain in effect (meaning this test can be used) for the duration of the COVID-19 declaration under Section 564(b)(1) of the Act, 21 U.S.C. section 360bbb-3(b)(1), unless the authorization is terminated or revoked.  Performed at [facility], [place] [address], [place], [place] [address]      Labs: CBC: Recent Labs  Lab 04/29/21 1212 04/30/21 0048 05/01/21 0534  WBC 7.1 6.0 4.3  HGB 12.7 11.9* 14.2  HCT 40.3 37.9 43.6  MCV 109.5* 108.9* 105.8*  PLT 330 279 [id number]   Basic Metabolic Panel: Recent Labs  Lab 04/29/21 1212 04/30/21 0048 05/01/21 0534  NA 137 139 136  K 4.1 3.9 4.5  CL 103 103 103  CO2 '28 28 30  '$ GLUCOSE 101* 130* 117*  BUN '10 10 10  '$ CREATININE 1.09* 0.94  0.99  CALCIUM 8.3* 8.3* 8.7*  MG  --  1.9 2.3  PHOS  --  3.0  --    Liver Function Tests: No results for input(s): AST, ALT, ALKPHOS, BILITOT, PROT, ALBUMIN in the last 168 hours. CBG: No results for input(s): GLUCAP in the last 168 hours.  Time spent: 35 minutes  Signed:  Berle Mull  [facility]  05/01/2021

## 2021-05-01 NOTE — Evaluation (Signed)
Physical Therapy Evaluation Patient Details Name: Wendy Roth MRN: [id number] DOB: [birth year] Today's Date: 05/01/2021   History of Present Illness  Pt is a 87 y.o. F who presents with complains of chest pain and acute hypoxic respiratory failure secondary to atelectasis. Significant PMH: mild dementia, CAD, HTN, type 2 diabetes.  Clinical Impression  PTA, pt lives at an ALF with her spouse and uses a Radiation protection practitioner for mobility. On PT evaluation, pt denying chest pain. Ambulating x 200 feet with no assistive device at a min guard assist level. SpO2 90-96% on RA, HR stable. Pt displays mild balance deficits, weakness and decreased endurance. Would benefit from follow up HHPT to address deficits and maximize functional mobility.     Follow Up Recommendations Home health PT;Supervision for mobility/OOB (at ALF)    Equipment Recommendations  None recommended by PT    Recommendations for Other Services       Precautions / Restrictions Precautions Precautions: Fall Restrictions Weight Bearing Restrictions: No      Mobility  Bed Mobility Overal bed mobility: Needs Assistance Bed Mobility: Supine to Sit;Sit to Supine     Supine to sit: Min assist Sit to supine: Supervision   General bed mobility comments: MinA to execute trunk to sitting position. Pt managing self back into bed without physical assist    Transfers Overall transfer level: Needs assistance Equipment used: None Transfers: Sit to/from Stand Sit to Stand: Min guard            Ambulation/Gait Ambulation/Gait assistance: Min guard Gait Distance (Feet): 200 Feet Assistive device: None Gait Pattern/deviations: Step-through pattern;Decreased stride length Gait velocity: decreased   General Gait Details: Pt requiring min guard assist for mobility, cues provided for activity pacing, pt with one episode of mild lateral LOB with head turn, which pt was able to self correct.  Stairs            Wheelchair  Mobility    Modified Rankin (Stroke Patients Only)       Balance Overall balance assessment: Mild deficits observed, not formally tested                                           Pertinent Vitals/Pain Pain Assessment: No/denies pain    Home Living Family/patient expects to be discharged to:: Assisted living Living Arrangements: Spouse/significant other             Home Equipment: Walker - 4 wheels      Prior Function Level of Independence: Independent with assistive device(s)         Comments: Utilizes rollator for longer distance ambulation i.e. to dining hall     Hand Dominance        Extremity/Trunk Assessment   Upper Extremity Assessment Upper Extremity Assessment: Overall WFL for tasks assessed    Lower Extremity Assessment Lower Extremity Assessment: Generalized weakness    Cervical / Trunk Assessment Cervical / Trunk Assessment: Kyphotic  Communication   Communication: No difficulties  Cognition Arousal/Alertness: Awake/alert Behavior During Therapy: WFL for tasks assessed/performed Overall Cognitive Status: History of cognitive impairments - at baseline                                 General Comments: Pt pleasant and cooperative, follows all commands. Thought her telemetry box was her phone  General Comments      Exercises     Assessment/Plan    PT Assessment Patient needs continued PT services  PT Problem List Decreased strength;Decreased balance;Decreased activity tolerance;Decreased mobility;Decreased cognition       PT Treatment Interventions DME instruction;Gait training;Stair training;Functional mobility training;Therapeutic activities;Therapeutic exercise;Balance training;Patient/family education    PT Goals (Current goals can be found in the Care Plan section)  Acute Rehab PT Goals Patient Stated Goal: go home PT Goal Formulation: With patient Time For Goal Achievement:  05/15/21 Potential to Achieve Goals: Good    Frequency Min 3X/week   Barriers to discharge        Co-evaluation               AM-PAC PT "6 Clicks" Mobility  Outcome Measure Help needed turning from your back to your side while in a flat bed without using bedrails?: None Help needed moving from lying on your back to sitting on the side of a flat bed without using bedrails?: A Little Help needed moving to and from a bed to a chair (including a wheelchair)?: A Little Help needed standing up from a chair using your arms (e.g., wheelchair or bedside chair)?: A Little Help needed to walk in hospital room?: A Little Help needed climbing 3-5 steps with a railing? : A Little 6 Click Score: 19    End of Session Equipment Utilized During Treatment: Gait belt Activity Tolerance: Patient tolerated treatment well Patient left: in bed;with call bell/phone within reach;with bed alarm set   PT Visit Diagnosis: Unsteadiness on feet (R26.81);Muscle weakness (generalized) (M62.81)    Time: [id number] PT Time Calculation (min) (ACUTE ONLY): 9 min   Charges:   PT Evaluation $PT Eval Moderate Complexity: 1 Mod          Wyona Almas, PT, DPT Acute Rehabilitation Services Pager [phone] Office [phone]   Deno Etienne 05/01/2021, 3:59 PM

## 2021-05-01 NOTE — TOC Progression Note (Addendum)
Transition of Care [facility]) - Progression Note    Patient Details  Name: JALAINA GROSSEN MRN: [id number] Date of Birth: May 19, 1932  [facility]) CM/SW [place], RN Phone Number:[phone]  05/01/2021, 12:52 PM  Clinical Narrative:    [facility] consulted for patient that is returning to assisted living facility. CM at bedside to talk with patient. Patient confirms that she is from [place] [facility]. CM attempted to call husband to verify but no answer. CM called [place] [facility] at [facility] with no answer. Message has been left for admissions coordinator.   1410 CM attempted to call [place] [facility] at [place] options 1 for nursing & option 7 for admissions. Message left for house coordinator. CM will have to await return call.    1450 CM received return call from Volney American with admissions at [facility]. [phone]). Facility is requesting therapy evaluation for input on the appropriate level of care for the patient. Per Tye Maryland patient is from assisted living where she is in her apartment with her husband and patient was borderline for needing a higher level of care. Facility wants to ensure that patient has a safe and appropriate level of care for discharge. Tye Maryland is also concerned because patients chart reflects that patient has been on O2. CM informed Tye Maryland that Home O2 saturation screen will be done prior to discharge to determine need for home O2. Message has been sent to MD. PT evaluation orders have been entered.        Expected Discharge Plan and Services                                                 Social Determinants of Health (SDOH) Interventions    Readmission Risk Interventions No flowsheet data found.

## 2021-05-02 DIAGNOSIS — U071 COVID-19: Secondary | ICD-10-CM | POA: Insufficient documentation

## 2021-05-02 DIAGNOSIS — L989 Disorder of the skin and subcutaneous tissue, unspecified: Secondary | ICD-10-CM

## 2021-05-02 DIAGNOSIS — J9601 Acute respiratory failure with hypoxia: Secondary | ICD-10-CM | POA: Diagnosis not present

## 2021-05-02 HISTORY — DX: Disorder of the skin and subcutaneous tissue, unspecified: L98.9

## 2021-05-02 HISTORY — DX: COVID-19: U07.1

## 2021-05-02 NOTE — Discharge Summary (Signed)
[facility] Discharge Summary   Patient: Wendy Roth [id number]  PCP: Javier Glazier, MD  Date of admission: 04/29/2021   Date of discharge:  05/02/2021     Discharge Diagnoses:  Principal diagnosis Atelectasis  causing acute hypoxia Active Problems:   Acute respiratory failure with hypoxia ([place]) SVT  Admitted From: ALF Disposition:  ALF/ILF   Recommendations for Outpatient Follow-up:  PCP: please follow up with PCP in 1 week   Follow-up Information     Javier Glazier, MD. Schedule an appointment as soon as possible for a visit in 1 week(s).   Specialty: Internal Medicine Contact information: [address] [place] [phone]                Discharge Instructions     Diet - low sodium heart healthy   Complete by: As directed    Increase activity slowly   Complete by: As directed        Diet recommendation: Cardiac diet  Activity: The patient is advised to gradually reintroduce usual activities, as tolerated  Discharge Condition: stable  Code Status: DNR   History of present illness: As per the H and P dictated on admission, "Wendy Roth is a 87 y.o. female with medical history significant for mild dementia, coronary artery disease, essential hypertension, type 2 diabetes, chronic anxiety/depression, hypothyroidism, who presented to [facility] ED from assisted living facility with complaints of chest pain.  Nonradiating.  She is a poor historian.  Called her husband to obtain more information but unsuccessfully, no answer.  Majority of the history is obtained from EDP and review of medical records.  Patient had a dream that she was having severe chest pain and woke up having chest pain.  Nursing staff was called to the room and on vitals signs she was noted to be tachycardic and had some increase work of breathing with complaints of lightheadedness.  EMS was activated.  She was given sublingual nitroglycerin and a full dose  aspirin with resolution of her chest pain.  On EDPs evaluation she was noted to be hypoxic with O2 saturation of 88% on room air.  Not on oxygen supplementation at baseline.  She had a CTA chest evaluation which was negative for PE.  No evidence of pneumonia or pulmonary edema however showed atelectasis and scarring.  EDP requested admission due to acute hypoxic respiratory failure.  Patient was admitted under hospitalist service.   ED Course: Temperature, T-max 99.5.  BP 134/55, pulse 67, respiratory rate 13, O2 saturation 84% on room air.  Lab studies remarkable for creatinine 1.09 with GFR 49.  CBC was unremarkable.  "  Hospital Course:  Summary of her active problems in the hospital is as following. 1.  Acute hypoxic respiratory failure secondary to atelectasis CTA negative for PE. No pneumonia or volume overload as well. Examination unrevealing for COPD exacerbation or bronchospasm. Likely atelectasis.  Good sats On room air on exertion.    2.  SVT Chest pain CAD HTN HLD Started having SVT in the morning of 7/26.  Resolved with IV Lopressor.  Currently resuming home Cardizem.  Also continuing home Lopressor. TSH and free T4 normal. Continue Synthroid. Echocardiogram was showing normal EF and no valvular abnormality  Blood pressure otherwise stable.   Diet controlled dyslipidemia.  Will monitor.   3.  Mood disorder, anxiety, depression. Dementia. Delirium. Patient is on Wellbutrin, Cymbalta, clonazepam, Aricept, gabapentin, hydroxyzine, risperidone on Requip and Zoloft. Suspect patient is at risk  for polypharmacy. Recommend follow up with PCP and at least discussion regarding Wellbutrin Cymbalta and Zoloft and removal of 2 of the medication from this regimen. For now we will monitor and continue the regimen   4.  Chest pain Suspect GI in nature. Resolved, EKG and troponin negative for acute ischemia  Will continue PPI.   5.  Hypothyroidism Normal TSH and free T4. Resume  Synthroid   6.  Macrocytosis Check further work-up outpatient. No anemia.   7. Obesity: Placing the pt at risk of poor outcome and also at risk of OSA.  Body mass index is 32.32 kg/m.   Patient was ambulatory without any assistance. On the day of the discharge the patient's vitals were stable, and no other new acute medical condition were reported. The patient was felt safe to be discharge at ALF/ILF with Home health.  Consultants: none Procedures: Echocardiogram   DISCHARGE MEDICATION: Allergies as of 05/02/2021       Reactions   Cortisone Shortness Of Breath   She blames symptoms of SOB & palpitations on shot of cortisone.        Medication List     STOP taking these medications    lisinopril 20 MG tablet Commonly known as: ZESTRIL       TAKE these medications    acetaminophen 500 MG tablet Commonly known as: TYLENOL Take 500 mg by mouth 3 (three) times daily. Morning, Noon, Evening (Scheduled)   aspirin 81 MG EC tablet Take 1 tablet (81 mg total) by mouth daily. Swallow whole.   atorvastatin 10 MG tablet Commonly known as: LIPITOR Take 10 mg by mouth at bedtime.   buPROPion 300 MG 24 hr tablet Commonly known as: WELLBUTRIN XL Take 300 mg by mouth every morning.   clonazePAM 0.5 MG tablet Commonly known as: KLONOPIN Take 0.5 mg by mouth at bedtime.   diltiazem 120 MG 24 hr capsule Commonly known as: Cardizem CD Take 1 capsule (120 mg total) by mouth daily.   donepezil 5 MG tablet Commonly known as: ARICEPT Take 5 mg by mouth every evening.   DULoxetine 60 MG capsule Commonly known as: CYMBALTA Take 120 mg by mouth every morning.   gabapentin 300 MG capsule Commonly known as: NEURONTIN Take 300 mg by mouth 3 (three) times daily.   HYDROcodone-acetaminophen 5-325 MG tablet Commonly known as: NORCO/VICODIN Take 1-2 tablets by mouth every 4 (four) hours as needed for moderate pain (pain score 4-6).   hydroxypropyl methylcellulose /  hypromellose 2.5 % ophthalmic solution Commonly known as: ISOPTO TEARS / GONIOVISC Place 1 drop into both eyes 3 (three) times daily.   hydrOXYzine 10 MG tablet Commonly known as: ATARAX/VISTARIL Take 10 mg by mouth at bedtime.   Lactaid 3000 units tablet Generic drug: lactase Take 3,000 Units by mouth as needed for dry skin.   latanoprost 0.005 % ophthalmic solution Commonly known as: XALATAN Place 1 drop into both eyes at bedtime.   levothyroxine 75 MCG tablet Commonly known as: SYNTHROID Take 75 mcg by mouth daily before breakfast.   magnesium oxide 400 (241.3 Mg) MG tablet Commonly known as: MAG-OX Take 1 tablet (400 mg total) by mouth daily.   metoprolol succinate 50 MG 24 hr tablet Commonly known as: TOPROL-XL Take 50 mg by mouth every evening. Take with or immediately following a meal.   nitroGLYCERIN 0.4 MG SL tablet Commonly known as: NITROSTAT Place 0.4 mg under the tongue every 5 (five) minutes as needed for chest pain.   omeprazole  40 MG capsule Commonly known as: PRILOSEC Take 40 mg by mouth daily.   ondansetron 4 MG tablet Commonly known as: ZOFRAN Take 1 tablet (4 mg total) by mouth every 6 (six) hours as needed for nausea.   polyethylene glycol 17 g packet Commonly known as: MIRALAX / GLYCOLAX Take 17 g by mouth daily as needed for mild constipation.   primidone 50 MG tablet Commonly known as: MYSOLINE Take 50 mg by mouth 4 (four) times daily.   risperiDONE 0.25 MG tablet Commonly known as: RISPERDAL Take 0.125 mg by mouth 2 (two) times daily.   rOPINIRole 0.5 MG tablet Commonly known as: REQUIP Take 0.5 mg by mouth daily.   senna 8.6 MG tablet Commonly known as: SENOKOT Take 1 tablet by mouth as needed for constipation.   sertraline 100 MG tablet Commonly known as: ZOLOFT Take 100 mg by mouth at bedtime.   Vitamin D (Ergocalciferol) 1.25 MG (50000 UNIT) Caps capsule Commonly known as: DRISDOL Take 50,000 Units by mouth every 14  (fourteen) days.        Discharge Exam: Filed Weights   04/29/21 2011  Weight: 72.6 kg   Vitals:   05/01/21 2019 05/02/21 0447  BP: (!) 149/49 (!) 164/82  Pulse: 64 (!) 56  Resp: 17 17  Temp: 97.9 F (36.6 C) 97.8 F (36.6 C)  SpO2: 92% 90%   General: Appear in mild distress, no Rash; Oral Mucosa Clear, moist. no Abnormal Neck Mass Or lumps, Conjunctiva normal  Cardiovascular: S1 and S2 Present, no Murmur, Respiratory: good respiratory effort, Bilateral Air entry present and CTA, no Crackles, no wheezes Abdomen: Bowel Sound present, Soft and no tenderness Extremities: no Pedal edema Neurology: alert and oriented to time, place, and person affect appropriate. no new focal deficit Gait not checked due to patient safety concerns  The results of significant diagnostics from this hospitalization (including imaging, microbiology, ancillary and laboratory) are listed below for reference.    Significant Diagnostic Studies: DG Chest 2 View  Result Date: 04/29/2021 CLINICAL DATA:  Chest pain. EXAM: CHEST - 2 VIEW COMPARISON:  Feb 07, 2017. FINDINGS: The heart size and mediastinal contours are within normal limits. Minimal bibasilar subsegmental atelectasis is noted. The visualized skeletal structures are unremarkable. IMPRESSION: Minimal bibasilar subsegmental atelectasis. Electronically Signed   By: Marijo Conception M.D.   On: 04/29/2021 13:52   DG Abd 1 View  Result Date: 04/30/2021 CLINICAL DATA:  Abdominal pain EXAM: ABDOMEN - 1 VIEW COMPARISON:  08/18/2017 FINDINGS: Nonobstructive bowel gas pattern. No radio-opaque calculi or other significant radiographic abnormality are seen. Multilevel degenerative lumbar spondylosis. Aortic atherosclerosis. A IMPRESSION: Negative. Electronically Signed   By: Davina Poke D.O.   On: 04/30/2021 12:07   CT Angio Chest PE W and/or Wo Contrast  Result Date: 04/29/2021 CLINICAL DATA:  87 year old female with concern for pulmonary embolism. EXAM:  CT ANGIOGRAPHY CHEST WITH CONTRAST TECHNIQUE: Multidetector CT imaging of the chest was performed using the standard protocol during bolus administration of intravenous contrast. Multiplanar CT image reconstructions and MIPs were obtained to evaluate the vascular anatomy. CONTRAST:  71m OMNIPAQUE IOHEXOL 350 MG/ML SOLN COMPARISON:  Chest CT dated 02/04/2017 and radiograph dated 04/29/2021. FINDINGS: Cardiovascular: There is no cardiomegaly or pericardial effusion. There is coronary vascular calcification. Mild atherosclerotic calcification of the thoracic aorta. No aneurysmal dilatation. No pulmonary artery embolus identified. Mediastinum/Nodes: There is no hilar or mediastinal adenopathy. The esophagus is grossly unremarkable. No mediastinal fluid collection. Lungs/Pleura: Bibasilar subpleural linear atelectasis/scarring. No focal  consolidation, pleural effusion, or pneumothorax. The central airways are patent. Upper Abdomen: No acute abnormality. Musculoskeletal: Osteopenia with degenerative changes of the spine. No acute osseous pathology. Review of the MIP images confirms the above findings. IMPRESSION: No acute intrathoracic pathology. No CT evidence of pulmonary embolism. Electronically Signed   By: Anner Crete M.D.   On: 04/29/2021 22:34   ECHOCARDIOGRAM COMPLETE  Result Date: 04/30/2021    ECHOCARDIOGRAM REPORT   Patient Name:   LONYA SCHRENK Date of Exam: 04/30/2021 Medical Rec #:  [id number]           Height:       59.0 in Accession #:    [id number]          Weight:       160.0 lb Date of Birth:  12/05/1931           BSA:          1.677 m Patient Age:    59 years            BP:           153/93 mmHg Patient Gender: F                   HR:           67 bpm. Exam Location:  Inpatient Procedure: 2D Echo, Cardiac Doppler, Color Doppler and Intracardiac            Opacification Agent Indications:    Dyspnea R06.00  History:        Patient has prior history of Echocardiogram examinations, most                  recent 02/05/2017. CAD; Risk Factors:Hypertension, Diabetes and                 Dyslipidemia.  Sonographer:    Bernadene Person RDCS Referring Phys: [id number] [place]  1. Moderate septal hypertrophy with otherwise mild concentric hypertrophy. Left ventricular ejection fraction, by estimation, is 55 to 60%. The left ventricle has normal function. The left ventricle has no regional wall motion abnormalities. There is moderate left ventricular hypertrophy. Left ventricular diastolic parameters are consistent with Grade I diastolic dysfunction (impaired relaxation). Elevated left ventricular end-diastolic pressure.  2. Right ventricular systolic function is normal. The right ventricular size is normal.  3. The mitral valve is normal in structure. No evidence of mitral valve regurgitation. No evidence of mitral stenosis.  4. The aortic valve is tricuspid. Aortic valve regurgitation is not visualized. No aortic stenosis is present.  5. Aortic dilatation noted. There is borderline dilatation of the aortic root, measuring 36 mm.  6. The inferior vena cava is normal in size with greater than 50% respiratory variability, suggesting right atrial pressure of 3 mmHg. FINDINGS  Left Ventricle: Moderate septal hypertrophy with otherwise mild concentric hypertrophy. Left ventricular ejection fraction, by estimation, is 55 to 60%. The left ventricle has normal function. The left ventricle has no regional wall motion abnormalities. Definity contrast agent was given IV to delineate the left ventricular endocardial borders. The left ventricular internal cavity size was normal in size. There is moderate left ventricular hypertrophy. Left ventricular diastolic parameters  are consistent with Grade I diastolic dysfunction (impaired relaxation). Elevated left ventricular end-diastolic pressure. Right Ventricle: The right ventricular size is normal. No increase in right ventricular wall thickness. Right ventricular  systolic function is normal. Left Atrium: Left atrial size was normal in size. Right Atrium:  Right atrial size was normal in size. Pericardium: There is no evidence of pericardial effusion. Mitral Valve: The mitral valve is normal in structure. No evidence of mitral valve regurgitation. No evidence of mitral valve stenosis. Tricuspid Valve: The tricuspid valve is normal in structure. Tricuspid valve regurgitation is not demonstrated. No evidence of tricuspid stenosis. Aortic Valve: The aortic valve is tricuspid. Aortic valve regurgitation is not visualized. No aortic stenosis is present. Pulmonic Valve: The pulmonic valve was normal in structure. Pulmonic valve regurgitation is not visualized. No evidence of pulmonic stenosis. Aorta: Aortic dilatation noted. There is borderline dilatation of the aortic root, measuring 36 mm. Venous: The inferior vena cava is normal in size with greater than 50% respiratory variability, suggesting right atrial pressure of 3 mmHg. IAS/Shunts: No atrial level shunt detected by color flow Doppler.  LEFT VENTRICLE PLAX 2D LVIDd:         4.60 cm  Diastology LVIDs:         3.20 cm  LV e' medial:    4.14 cm/s LV PW:         1.20 cm  LV E/e' medial:  17.1 LV IVS:        1.40 cm  LV e' lateral:   5.68 cm/s LVOT diam:     2.30 cm  LV E/e' lateral: 12.5 LV SV:         82 LV SV Index:   49 LVOT Area:     4.15 cm  RIGHT VENTRICLE RV S prime:     9.32 cm/s TAPSE (M-mode): 1.8 cm LEFT ATRIUM             Index       RIGHT ATRIUM           Index LA diam:        3.30 cm 1.97 cm/m  RA Area:     11.60 cm LA Vol (A2C):   24.9 ml 14.84 ml/m RA Volume:   21.20 ml  12.64 ml/m LA Vol (A4C):   31.1 ml 18.54 ml/m LA Biplane Vol: 29.3 ml 17.47 ml/m  AORTIC VALVE LVOT Vmax:   92.20 cm/s LVOT Vmean:  60.600 cm/s LVOT VTI:    0.198 m  AORTA Ao Root diam: 3.60 cm Ao Asc diam:  3.40 cm MITRAL VALVE MV Area (PHT): 3.17 cm     SHUNTS MV Decel Time: 239 msec     Systemic VTI:  0.20 m MV E velocity: 70.80 cm/s    Systemic Diam: 2.30 cm MV A velocity: 100.00 cm/s MV E/A ratio:  0.71 Skeet Latch MD Electronically signed by Skeet Latch MD Signature Date/Time: 04/30/2021/4:44:11 PM    Final     Microbiology: Recent Results (from the past 240 hour(s))  Resp Panel by RT-PCR (Flu A&B, Covid) Nasopharyngeal Swab     Status: None   Collection Time: 04/29/21  8:54 PM   Specimen: Nasopharyngeal Swab; Nasopharyngeal(NP) swabs in vial transport medium  Result Value Ref Range Status   SARS Coronavirus 2 by RT PCR NEGATIVE NEGATIVE Final    Comment: (NOTE) SARS-CoV-2 target nucleic acids are NOT DETECTED.  The SARS-CoV-2 RNA is generally detectable in upper respiratory specimens during the acute phase of infection. The lowest concentration of SARS-CoV-2 viral copies this assay can detect is 138 copies/mL. A negative result does not preclude SARS-Cov-2 infection and should not be used as the sole basis for treatment or other patient management decisions. A negative result may occur with  improper specimen collection/handling,  submission of specimen other than nasopharyngeal swab, presence of viral mutation(s) within the areas targeted by this assay, and inadequate number of viral copies(<138 copies/mL). A negative result must be combined with clinical observations, patient history, and epidemiological information. The expected result is Negative.  Fact Sheet for Patients:  EntrepreneurPulse.com.au  Fact Sheet for Healthcare Providers:  IncredibleEmployment.be  This test is no t yet approved or cleared by the [place] FDA and  has been authorized for detection and/or diagnosis of SARS-CoV-2 by FDA under an Emergency Use Authorization (EUA). This EUA will remain  in effect (meaning this test can be used) for the duration of the COVID-19 declaration under Section 564(b)(1) of the Act, 21 U.S.C.section 360bbb-3(b)(1), unless the authorization is terminated  or  revoked sooner.       Influenza A by PCR NEGATIVE NEGATIVE Final   Influenza B by PCR NEGATIVE NEGATIVE Final    Comment: (NOTE) The Xpert Xpress SARS-CoV-2/FLU/RSV plus assay is intended as an aid in the diagnosis of influenza from Nasopharyngeal swab specimens and should not be used as a sole basis for treatment. Nasal washings and aspirates are unacceptable for Xpert Xpress SARS-CoV-2/FLU/RSV testing.  Fact Sheet for Patients: EntrepreneurPulse.com.au  Fact Sheet for Healthcare Providers: IncredibleEmployment.be  This test is not yet approved or cleared by the [place] FDA and has been authorized for detection and/or diagnosis of SARS-CoV-2 by FDA under an Emergency Use Authorization (EUA). This EUA will remain in effect (meaning this test can be used) for the duration of the COVID-19 declaration under Section 564(b)(1) of the Act, 21 U.S.C. section 360bbb-3(b)(1), unless the authorization is terminated or revoked.  Performed at [facility], [place] [address] [place], [place] [address]      Labs: CBC: Recent Labs  Lab 04/29/21 1212 04/30/21 0048 05/01/21 0534  WBC 7.1 6.0 4.3  HGB 12.7 11.9* 14.2  HCT 40.3 37.9 43.6  MCV 109.5* 108.9* 105.8*  PLT 330 279 [id number]    Basic Metabolic Panel: Recent Labs  Lab 04/29/21 1212 04/30/21 0048 05/01/21 0534  NA 137 139 136  K 4.1 3.9 4.5  CL 103 103 103  CO2 '28 28 30  '$ GLUCOSE 101* 130* 117*  BUN '10 10 10  '$ CREATININE 1.09* 0.94 0.99  CALCIUM 8.3* 8.3* 8.7*  MG  --  1.9 2.3  PHOS  --  3.0  --     Liver Function Tests: No results for input(s): AST, ALT, ALKPHOS, BILITOT, PROT, ALBUMIN in the last 168 hours. CBG: No results for input(s): GLUCAP in the last 168 hours.  Time spent: 35 minutes  Signed:  Berle Mull  [facility]  05/02/2021

## 2021-05-02 NOTE — TOC Progression Note (Addendum)
Transition of Care [facility]) - Progression Note    Patient Details  Name: Wendy Roth MRN: [id number] Date of Birth: 1931-10-21  [facility] CM/SW [place], [place] Phone Number: 05/02/2021, 9:14 AM  Clinical Narrative:      CSW called [facility]. CSW provides info from PT note. They request CSW send clinicals to allow them to review. Fax to [phone]  1005: Pt can return to [facility] today. They have their own service that will provide PT. CSW faxed Fl2, DC summary, PT note, and HH orders/face to face. [facility] transportation will pick pt up at 11am. CSW provided transport service unit phone number to call when they arrive. CSW updated pt and pt husband.      Expected Discharge Plan and Services           Expected Discharge Date: 05/01/21                                     Social Determinants of Health (SDOH) Interventions    Readmission Risk Interventions No flowsheet data found.

## 2021-05-02 NOTE — [place] FL2 (Signed)
[place] MEDICAID FL2 LEVEL OF CARE SCREENING TOOL     IDENTIFICATION  Patient Name: Wendy Roth Birthdate: Mar 04, 1932 Sex: female Admission Date (Current Location): 04/29/2021  [facility] and [place] Number:  Herbalist and Address:  [facility] [place] [place] [address] [place], [place] [address]      Provider Number: [id number]  Attending Physician Name and Address:  Lavina Hamman, MD  Relative Name and Phone Number:       Current Level of Care: Hospital Recommended Level of Care: [place] Prior Approval Number:    Date Approved/Denied:   PASRR Number:    Discharge Plan: Other (Comment) (ALF)    Current Diagnoses: Patient Active Problem List   Diagnosis Date Noted   GERD (gastroesophageal reflux disease)    Arthritis    Back pain    Carpal tunnel syndrome    Complication of anesthesia    Diverticulosis    Fatigue    Glaucoma    History of kidney stones    Hypercholesteremia    Hypertension    Nasal turbinate hypertrophy    Restless leg syndrome    Seizures ([place])    Depression, unspecified 07/06/2020   History of total knee arthroplasty 02/16/2020   Anemia, unspecified 09/09/2019   Encounter for immunization 09/09/2019   Mild cognitive impairment, so stated 09/09/2019   Other specified symptoms and signs involving the digestive system and abdomen 09/09/2019   Unspecified hemorrhoids [id number]   Helicobacter pylori (H. pylori) as the cause of diseases classified elsewhere 09/09/2019   Other dental procedure status 09/09/2019   Difficulty in walking, not elsewhere classified 08/27/2019   Atrioventricular block, first degree 08/27/2019   Diverticulosis of intestine, part unspecified, without perforation or abscess without bleeding 08/27/2019   Aftercare following joint replacement surgery 08/26/2019   Presence of left artificial knee joint 08/26/2019   Osteoarthritis of left knee 08/25/2019   Diabetes  mellitus due to underlying condition with unspecified complications ([place]) [id number]   Mixed dyslipidemia 12/06/2018   1st degree AV block 12/06/2018   Dyslipidemia 12/06/2018   Secondary diabetes mellitus ([place]) 12/06/2018   Acute nasopharyngitis (common cold) 12/30/2017   Allergic rhinitis due to pollen 12/30/2017   Candidal stomatitis 12/30/2017   Diarrhea, unspecified 12/30/2017   Dry eye syndrome of unspecified lacrimal gland 12/30/2017   Impacted cerumen, bilateral 12/30/2017   Ingrown nail 12/30/2017   Other idiopathic peripheral autonomic neuropathy 12/30/2017   Unspecified abnormalities of gait and mobility 12/30/2017   Unspecified psychosis not due to a substance or known physiological condition ([place]) 12/30/2017   Pain in unspecified lower leg 12/30/2017   Encounter for other orthopedic aftercare 12/20/2017   Muscle weakness (generalized) 12/20/2017   Nausea 12/20/2017   Other elevated white blood cell count 12/20/2017   Other lack of coordination 12/20/2017   Personal history of breast cancer 12/20/2017   Unsteadiness on feet 12/20/2017   Unspecified osteoarthritis, unspecified site 12/20/2017   Spinal stenosis, lumbosacral region 12/20/2017   Status post lumbar spine operative procedure for decompression of spinal cord 12/17/2017   Osteoarthritis of right knee 11/20/2017   Degenerative spondylolisthesis 11/18/2017   Degeneration of lumbar intervertebral disc 10/30/2017   Spinal stenosis of lumbar region 10/30/2017   Pain in left knee 07/07/2017   Allergy, unspecified, initial encounter 07/07/2017   Dementia in other diseases classified elsewhere without behavioral disturbance ([place]) 07/07/2017   Constipation, unspecified 07/07/2017   Localized edema 07/07/2017   Other muscle spasm 07/07/2017  Pain in left finger(s) 07/07/2017   Pelvic and perineal pain 07/07/2017   Other hemorrhoids 07/07/2017   Vitamin B12 deficiency anemia due to intrinsic factor deficiency  07/07/2017   Low back pain 07/07/2017   Pain in left ankle and joints of left foot 07/07/2017   Unspecified abdominal pain 07/07/2017   Atherosclerotic heart disease of native coronary artery without angina pectoris 07/07/2017   Hyperlipidemia, unspecified 07/07/2017   Nutritional deficiency, unspecified 06/16/2017   Other allergic rhinitis 06/16/2017   Pain, unspecified 06/16/2017   Vitamin D deficiency, unspecified 06/16/2017   Anxiety disorder, unspecified 06/16/2017   Unspecified glaucoma 06/16/2017   Pure hypercholesterolemia, unspecified 06/16/2017   Hypothyroidism, unspecified 06/16/2017   Risk for falls 06/03/2017   Hypoxia 02/07/2017   SVT (supraventricular tachycardia) ([place]) 02/04/2017   HLD (hyperlipidemia) 02/04/2017   Depression 02/04/2017   Chronic back pain 02/04/2017   Acute respiratory failure with hypoxia ([place]) 02/04/2017   Neurogenic claudication due to lumbar spinal stenosis 02/04/2017   Hypothyroidism    Anxiety    Memory loss of 01/27/2017   Amnesia [id number]   Umbilical hernia [id number]   Gastroesophageal reflux disease 11/16/2014   Hypertrophy of nasal turbinates 09/15/2013   Type 2 diabetes mellitus ([place]) 09/15/2013   Arthropathy 04/13/2013   Hypertension, benign 04/13/2013   Restless legs syndrome 04/13/2013   Essential hypertension 04/13/2013   Benign hypertension 04/13/2013   Family history of breast cancer 02/18/2013   Family history of malignant neoplasm of female breast 02/18/2013   Cancer of lower-inner quadrant of left female breast ([place]) 10/18/2012   Coronary artery disease 10/07/2003   Coronary arteriosclerosis 10/07/2003   Breast cancer ([place]) 2002    Orientation RESPIRATION BLADDER Height & Weight     Self, Time, Situation, Place  Normal Incontinent Weight: 160 lb (72.6 kg) Height:  '4\' 11"'$  (149.9 cm)  BEHAVIORAL SYMPTOMS/MOOD NEUROLOGICAL BOWEL NUTRITION STATUS      Continent Diet (Regular diet- no salt added)  AMBULATORY STATUS  COMMUNICATION OF NEEDS Skin   Supervision Verbally Normal                       Personal Care Assistance Level of Assistance  Bathing, Feeding, Dressing Bathing Assistance: Limited assistance Feeding assistance: Independent Dressing Assistance: Independent     Functional Limitations Info  Sight, Hearing, Speech Sight Info: Adequate Hearing Info: Adequate Speech Info: Adequate    SPECIAL CARE FACTORS FREQUENCY  PT (By licensed PT)     PT Frequency: 2-3x/week              Contractures Contractures Info: Not present    Additional Factors Info  Code Status, Allergies Code Status Info: DNR Allergies Info: Cortisone           Current Medications (05/02/2021):  This is the current hospital active medication list Current Facility-Administered Medications  Medication Dose Route Frequency Provider Last Rate Last Admin   acetaminophen (TYLENOL) tablet 650 mg  650 mg Oral Q6H PRN Irene Pap N, DO       aspirin EC tablet 81 mg  81 mg Oral Daily Irene Pap N, DO   81 mg at 05/01/21 0946   atorvastatin (LIPITOR) tablet 10 mg  10 mg Oral QHS Hall, Carole N, DO   10 mg at 05/01/21 2155   buPROPion (WELLBUTRIN XL) 24 hr tablet 300 mg  300 mg Oral q morning Lavina Hamman, MD   300 mg at 05/01/21 0953   clonazePAM (KLONOPIN) tablet 0.5  mg  0.5 mg Oral QHS Irene Pap N, DO   0.5 mg at 05/01/21 2155   diltiazem (CARDIZEM CD) 24 hr capsule 120 mg  120 mg Oral Daily Lavina Hamman, MD   120 mg at 05/01/21 0949   donepezil (ARICEPT) tablet 5 mg  5 mg Oral QPM Lavina Hamman, MD   5 mg at 05/01/21 1626   DULoxetine (CYMBALTA) DR capsule 120 mg  120 mg Oral q morning Kayleen Memos, DO   120 mg at 05/01/21 0954   enoxaparin (LOVENOX) injection 40 mg  40 mg Subcutaneous Q24H Irene Pap N, DO   40 mg at 05/01/21 [id number]   gabapentin (NEURONTIN) capsule 300 mg  300 mg Oral TID Lavina Hamman, MD   300 mg at 05/01/21 2154   hydrOXYzine (ATARAX/VISTARIL) tablet 10 mg  10 mg Oral  Q1200 Irene Pap N, DO   10 mg at 05/01/21 1301   ipratropium-albuterol (DUONEB) 0.5-2.5 (3) MG/3ML nebulizer solution 3 mL  3 mL Nebulization Q6H PRN Lavina Hamman, MD       latanoprost (XALATAN) 0.005 % ophthalmic solution 1 drop  1 drop Both Eyes QHS Lavina Hamman, MD   1 drop at 05/01/21 2155   melatonin tablet 3 mg  3 mg Oral QHS PRN Irene Pap N, DO       metoprolol tartrate (LOPRESSOR) tablet 25 mg  25 mg Oral BID Lavina Hamman, MD   25 mg at 05/01/21 2155   ondansetron (ZOFRAN) injection 4 mg  4 mg Intravenous Q6H PRN Irene Pap N, DO       pantoprazole (PROTONIX) EC tablet 40 mg  40 mg Oral BID AC Lavina Hamman, MD   40 mg at 05/01/21 1626   primidone (MYSOLINE) tablet 50 mg  50 mg Oral QID Lavina Hamman, MD   50 mg at 05/01/21 2155   risperiDONE (RISPERDAL) tablet 0.125 mg  0.125 mg Oral BID Lavina Hamman, MD   0.125 mg at 05/01/21 2154   rOPINIRole (REQUIP) tablet 0.5 mg  0.5 mg Oral Daily Lavina Hamman, MD   0.5 mg at 05/01/21 0947   sertraline (ZOLOFT) tablet 100 mg  100 mg Oral QHS Lavina Hamman, MD   100 mg at 05/01/21 2156     Discharge Medications: Please see discharge summary for a list of discharge medications.  Relevant Imaging Results:  Relevant Lab Results:   Additional Information SSN [id number]  Bethann Berkshire, LCSW

## 2021-05-09 ENCOUNTER — Telehealth: Payer: Self-pay | Admitting: Cardiology

## 2021-05-09 NOTE — Telephone Encounter (Signed)
Pt c/o medication issue:  1. Name of Medication: lisinopril (ZESTRIL) tablet 20 mg  2. How are you currently taking this medication (dosage and times per day)? Stopped upon d/c from the hospital 04/29/21.  3. Are you having a reaction (difficulty breathing--STAT)?   4. What is your medication issue? Patient was admitted to the hospital for hypoxia but while she was in the hospital this medication was d/c  Pt c/o BP issue: STAT if pt c/o blurred vision, one-sided weakness or slurred speech  1. What are your last 5 BP readings?  05/08/2209:00 am: 152/66  05/08/21 3:00 am: 182/96 05/07/21 8:00 pm: 163/91 05/07/21 1:00 pm: 160/95 05/07/21 7:00 am: 134/76 05/06/21 5:00 pm: 169/91 05/05/21 10:00 pm: 171/82 04/29/21 5:00 pm: 185/91   2. Are you having any other symptoms (ex. Dizziness, headache, blurred vision, passed out)?   3. What is your BP issue? Nurse at [facility] called to make sure the information was faxed to the office about this patient's BP readings. Advised the nurse to provide information over the phone as well in case Fax got lost

## 2021-05-09 NOTE — Telephone Encounter (Signed)
Spoke to Wendy Roth just now and let her know Dr. Julien Nordmann recommendation. She verbalizes understanding and thanks me for calling back.

## 2021-11-12 DIAGNOSIS — S92353A Displaced fracture of fifth metatarsal bone, unspecified foot, initial encounter for closed fracture: Secondary | ICD-10-CM | POA: Insufficient documentation

## 2021-11-12 DIAGNOSIS — M79671 Pain in right foot: Secondary | ICD-10-CM | POA: Insufficient documentation

## 2021-11-15 ENCOUNTER — Ambulatory Visit: Payer: TRICARE For Life (TFL) | Admitting: Cardiology

## 2021-12-16 ENCOUNTER — Encounter: Payer: Self-pay | Admitting: Cardiology

## 2021-12-16 ENCOUNTER — Ambulatory Visit (INDEPENDENT_AMBULATORY_CARE_PROVIDER_SITE_OTHER): Payer: Medicare Other | Admitting: Cardiology

## 2021-12-16 ENCOUNTER — Other Ambulatory Visit: Payer: Self-pay

## 2021-12-16 VITALS — BP 124/76 | HR 78 | Ht 60.0 in | Wt 153.0 lb

## 2021-12-16 DIAGNOSIS — I2510 Atherosclerotic heart disease of native coronary artery without angina pectoris: Secondary | ICD-10-CM | POA: Diagnosis not present

## 2021-12-16 DIAGNOSIS — I10 Essential (primary) hypertension: Secondary | ICD-10-CM

## 2021-12-16 DIAGNOSIS — E782 Mixed hyperlipidemia: Secondary | ICD-10-CM

## 2021-12-16 NOTE — Patient Instructions (Signed)

## 2021-12-16 NOTE — Progress Notes (Signed)
Cardiology Office Note:    Date:  12/16/2021   ID:  Wendy Roth, DOB 02-21-1932, MRN [id number]  PCP:  Javier Glazier, MD  Cardiologist:  Jenean Lindau, MD   Referring MD: Javier Glazier, MD    ASSESSMENT:    1. Essential (primary) hypertension   2. Atherosclerosis of native coronary artery of native heart without angina pectoris   3. Mixed hyperlipidemia    PLAN:    In order of problems listed above:  Coronary artery disease: Secondary prevention stressed with the patient.  Importance of compliance with diet medication stressed and she vocalized understanding.  She is asymptomatic denies any chest pain or any such issues. Essential hypertension: Blood pressure stable and diet was emphasized. Mixed dyslipidemia: Lipids followed by primary care her doctor at the facility did blood work recently and she was told it was fine. Patient will be seen in follow-up appointment in 9 months or earlier if the patient has any concerns    Medication Adjustments/Labs and Tests Ordered: Current medicines are reviewed at length with the patient today.  Concerns regarding medicines are outlined above.  No orders of the defined types were placed in this encounter.  No orders of the defined types were placed in this encounter.    No chief complaint on file.    History of Present Illness:    Wendy Roth is a 88 y.o. female.  Patient has past medical history of coronary artery disease, essential hypertension, dyslipidemia.  She denies any problems at this time and takes care of activities of daily living.  No chest pain orthopnea or PND.  At the time of my evaluation, the patient is alert awake oriented and in no distress.  She lives in an assisted living facility and ambulates with a walker.  Past Medical History:  Diagnosis Date   1st degree AV block 12/06/2018   Noted on EKG   Acute nasopharyngitis (common cold) 12/30/2017   Acute respiratory failure with hypoxia  ([place]) 02/04/2017   Aftercare following joint replacement surgery 08/26/2019   Allergic rhinitis due to pollen 12/30/2017   Allergy, unspecified, initial encounter 07/07/2017   Amnesia 01/27/2017   Anemia, unspecified 09/09/2019   Anxiety    Anxiety disorder, unspecified 06/16/2017   Arthritis    Arthropathy 04/13/2013   Atherosclerotic heart disease of native coronary artery without angina pectoris 07/07/2017   Atrioventricular block, first degree 08/27/2019   Back pain    Benign hypertension 04/13/2013   Breast cancer ([place]) 2002   left breast   Breast cancer ([place]) 2014   right breast   Cancer of lower-inner quadrant of left female breast ([place]) 10/18/2012   Right IDC, ER/PR+, [id number]-    Candidal stomatitis 12/30/2017   Carpal tunnel syndrome    Chronic back pain 70/26/3785   Complication of anesthesia    Delirium post anesthesia   Constipation, unspecified 07/07/2017   Coronary arteriosclerosis 10/07/2003   Formatting of this note might be different from the original. Overview:  stent placed   Coronary artery disease 2005   stent placed   COVID-19 05/02/2021   Degeneration of lumbar intervertebral disc 10/30/2017   Degenerative spondylolisthesis 11/18/2017   Dementia in other diseases classified elsewhere without behavioral disturbance 07/07/2017   Depression    Depression, unspecified 07/06/2020   Diabetes mellitus due to underlying condition with unspecified complications ([place]) 88/50/2774   Diarrhea, unspecified 12/30/2017   Difficulty in walking, not elsewhere classified 08/27/2019   Disorder of the skin  and subcutaneous tissue, unspecified 05/02/2021   Diverticulosis    Diverticulosis of intestine, part unspecified, without perforation or abscess without bleeding 08/27/2019   Dry eye syndrome of unspecified lacrimal gland 12/30/2017   Dyslipidemia 12/06/2018   Encounter for immunization 09/09/2019   Encounter for other orthopedic aftercare 12/20/2017   Essential  (primary) hypertension 04/13/2013   Essential hypertension 04/13/2013   Family history of breast cancer 02/18/2013   Family history of malignant neoplasm of female breast 02/18/2013   Fatigue    Gastro-esophageal reflux disease without esophagitis 11/16/2014   "years ago" but still takes Prilosec   Gastroesophageal reflux disease 11/16/2014   "years ago" but still takes Prilosec   GERD (gastroesophageal reflux disease)    "years ago" but still takes Prilosec   Glaucoma    left eye   Helicobacter pylori (H. pylori) as the cause of diseases classified elsewhere 09/09/2019   History of kidney stones    History of total knee arthroplasty 02/16/2020   HLD (hyperlipidemia) 02/04/2017   Hypercholesteremia    Hyperlipidemia, unspecified 07/07/2017   Hypertension    Hypertension, benign 04/13/2013   Hypertrophy of nasal turbinates 09/15/2013   Hypothyroidism    Hypothyroidism, unspecified 06/16/2017   Hypoxia 02/07/2017   Impacted cerumen, bilateral 12/30/2017   Ingrown nail 12/30/2017   Localized edema 07/07/2017   Low back pain 07/07/2017   Memory loss of 01/27/2017   Mild cognitive impairment of uncertain or unknown etiology 09/09/2019   Mild cognitive impairment, so stated 09/09/2019   Mixed dyslipidemia 12/06/2018   Muscle weakness (generalized) 12/20/2017   Nasal turbinate hypertrophy    Nausea 12/20/2017   Neurogenic claudication due to lumbar spinal stenosis 02/04/2017   Nutritional deficiency, unspecified 06/16/2017   Osteoarthritis of left knee 08/25/2019   Osteoarthritis of right knee 11/20/2017   Other allergic rhinitis 06/16/2017   Other dental procedure status 09/09/2019   Other elevated white blood cell count 12/20/2017   Other hemorrhoids 07/07/2017   Other idiopathic peripheral autonomic neuropathy 12/30/2017   Other lack of coordination 12/20/2017   Other muscle spasm 07/07/2017   Other specified symptoms and signs involving the digestive system and abdomen  09/09/2019   Pain in left ankle and joints of left foot 07/07/2017   Pain in left finger(s) 07/07/2017   Pain in left knee 07/07/2017   Pain in unspecified lower leg 12/30/2017   Pain, unspecified 06/16/2017   Pelvic and perineal pain 07/07/2017   Personal history of breast cancer 12/20/2017   Presence of left artificial knee joint 08/26/2019   Pure hypercholesterolemia, unspecified 06/16/2017   Restless leg syndrome    Restless legs syndrome 04/13/2013   Risk for falls 06/03/2017   Secondary diabetes mellitus ([place]) 12/06/2018   Seizures ([place])    as a baby   Spinal stenosis of lumbar region 10/30/2017   Spinal stenosis, lumbosacral region 12/20/2017   Status post lumbar spine operative procedure for decompression of spinal cord 12/17/2017   SVT (supraventricular tachycardia) ([place])    Type 2 diabetes mellitus ([place]) 73/53/2992   Umbilical hernia    Unspecified abdominal pain 07/07/2017   Unspecified abnormalities of gait and mobility 12/30/2017   Unspecified glaucoma 06/16/2017   Unspecified hemorrhoids 09/09/2019   Unspecified osteoarthritis, unspecified site 12/20/2017   Unspecified psychosis not due to a substance or known physiological condition ([place]) 12/30/2017   Unsteadiness on feet 12/20/2017   Vitamin B12 deficiency anemia due to intrinsic factor deficiency 07/07/2017   Vitamin D deficiency, unspecified 06/16/2017  Past Surgical History:  Procedure Laterality Date   ABDOMINAL HYSTERECTOMY     ANGIOPLASTY     x2   BREAST LUMPECTOMY Left 2002   BREAST LUMPECTOMY Right 2014   BREAST LUMPECTOMY WITH NEEDLE LOCALIZATION Right 07/05/2013   Procedure: RIGHT BREAST WIRE GUIDED LUMPECTOMY ;  Surgeon: Rolm Bookbinder, MD;  Location: [facility];  Service: General;  Laterality: Right;   CARPAL TUNNEL RELEASE     bilateral   COLONOSCOPY     CORONARY ANGIOPLASTY  2005   stent   EYE SURGERY Bilateral    cataracts   HERNIA REPAIR     INSERTION OF MESH N/A 06/11/2016   Procedure:  INSERTION OF MESH;  Surgeon: Rolm Bookbinder, MD;  Location: [facility];  Service: General;  Laterality: N/A;   KNEE ARTHROPLASTY Left 08/25/2019   Procedure: COMPUTER ASSISTED TOTAL KNEE ARTHROPLASTY;  Surgeon: Rod Can, MD;  Location: [facility];  Service: Orthopedics;  Laterality: Left;   LAPAROSCOPIC INCISIONAL / UMBILICAL / VENTRAL HERNIA REPAIR  06/11/2016   UHR w/mesh   LUMBAR LAMINECTOMY/DECOMPRESSION MICRODISCECTOMY N/A 12/17/2017   Procedure: LUMBAR L3-S1 DECOMPRESSION WITH IN SITU FUSION;  Surgeon: Melina Schools, MD;  Location: [facility];  Service: Orthopedics;  Laterality: N/A;   MENISCUS REPAIR     left   PARATHYROIDECTOMY     RE-EXCISION OF BREAST CANCER,SUPERIOR MARGINS Right 08/01/2013   Procedure: RE-EXCISION OF BREAST CANCER MARGIN;  Surgeon: Rolm Bookbinder, MD;  Location: [facility];  Service: General;  Laterality: Right;   TONSILLECTOMY     UMBILICAL HERNIA REPAIR N/A 06/11/2016   Procedure: LAPAROSCOPIC UMBILICAL HERNIA;  Surgeon: Rolm Bookbinder, MD;  Location: [facility];  Service: General;  Laterality: N/A;    Current Medications: Current Meds  Medication Sig   acetaminophen (TYLENOL) 500 MG tablet Take 500 mg by mouth 3 (three) times daily. Morning, Noon, Evening (Scheduled)   aspirin EC 81 MG EC tablet Take 1 tablet (81 mg total) by mouth daily. Swallow whole.   atorvastatin (LIPITOR) 10 MG tablet Take 10 mg by mouth at bedtime.    buPROPion (WELLBUTRIN XL) 300 MG 24 hr tablet Take 300 mg by mouth every morning.   clonazePAM (KLONOPIN) 0.5 MG tablet Take 0.5 mg by mouth at bedtime.   diltiazem (CARDIZEM CD) 120 MG 24 hr capsule Take 1 capsule (120 mg total) by mouth daily.   donepezil (ARICEPT) 5 MG tablet Take 5 mg by mouth every evening.   DULoxetine (CYMBALTA) 60 MG capsule Take 120 mg by mouth every morning.   gabapentin (NEURONTIN) 300 MG capsule Take 300 mg by mouth 3 (three) times daily.   GOODSENSE ANTACID 750 MG chewable tablet Chew 2 tablets by mouth every 6  (six) hours as needed for heartburn.   HYDROcodone-acetaminophen (NORCO/VICODIN) 5-325 MG tablet Take 1-2 tablets by mouth every 4 (four) hours as needed for moderate pain (pain score 4-6).   hydroxypropyl methylcellulose / hypromellose (ISOPTO TEARS / GONIOVISC) 2.5 % ophthalmic solution Place 1 drop into both eyes 3 (three) times daily.    hydrOXYzine (ATARAX/VISTARIL) 10 MG tablet Take 10 mg by mouth at bedtime.   LACTAID 3000 units tablet Take 3,000 Units by mouth as needed for dry skin.   latanoprost (XALATAN) 0.005 % ophthalmic solution Place 1 drop into both eyes at bedtime.   levothyroxine (SYNTHROID, LEVOTHROID) 75 MCG tablet Take 75 mcg by mouth daily before breakfast.   lisinopril (ZESTRIL) 20 MG tablet Take 20 mg by mouth daily.   magnesium oxide (MAG-OX) 400 (  241.3 Mg) MG tablet Take 1 tablet (400 mg total) by mouth daily.   metoprolol succinate (TOPROL-XL) 50 MG 24 hr tablet Take 50 mg by mouth every evening. Take with or immediately following a meal.    nitroGLYCERIN (NITROSTAT) 0.4 MG SL tablet Place 0.4 mg under the tongue every 5 (five) minutes as needed for chest pain.   omeprazole (PRILOSEC) 40 MG capsule Take 40 mg by mouth daily.   ondansetron (ZOFRAN) 4 MG tablet Take 1 tablet (4 mg total) by mouth every 6 (six) hours as needed for nausea.   polyethylene glycol (MIRALAX / GLYCOLAX) 17 g packet Take 17 g by mouth daily as needed for mild constipation.    primidone (MYSOLINE) 50 MG tablet Take 50 mg by mouth 4 (four) times daily.    rOPINIRole (REQUIP) 0.5 MG tablet Take 0.5 mg by mouth daily.   senna (SENOKOT) 8.6 MG tablet Take 1 tablet by mouth as needed for constipation.   Vitamin D, Ergocalciferol, (DRISDOL) 1.25 MG (50000 UT) CAPS capsule Take 50,000 Units by mouth every 14 (fourteen) days.     Allergies:   Cortisone   Social History   Socioeconomic History   Marital status: Married    Spouse name: Not on file   Number of children: Not on file   Years of  education: Not on file   Highest education level: Not on file  Occupational History   Not on file  Tobacco Use   Smoking status: Never   Smokeless tobacco: Never  Vaping Use   Vaping Use: Never used  Substance and Sexual Activity   Alcohol use: Yes    Comment: socially   Drug use: No   Sexual activity: Not Currently  Other Topics Concern   Not on file  Social History Narrative   Not on file   Social Determinants of Health   Financial Resource Strain: Not on file  Food Insecurity: Not on file  Transportation Needs: Not on file  Physical Activity: Not on file  Stress: Not on file  Social Connections: Not on file     Family History: The patient's family history includes Breast cancer (age of onset: 29) in her maternal aunt; Cancer in her sister; Cancer (age of onset: 34) in her father; Cancer (age of onset: 66) in her mother; Cancer (age of onset: 44) in her maternal grandmother.  ROS:   Please see the history of present illness.    All other systems reviewed and are negative.  EKGs/Labs/Other Studies Reviewed:    The following studies were reviewed today: I discussed my findings with the patient at length.   Recent Labs: 04/01/2021: ALT 9 05/01/2021: BUN 10; Creatinine, Ser 0.99; Hemoglobin 14.2; Magnesium 2.3; Platelets 226; Potassium 4.5; Sodium 136; TSH 3.551  Recent Lipid Panel    Component Value Date/Time   CHOL 130 04/01/2021 1017   TRIG 203 (H) 04/01/2021 1017   HDL 54 04/01/2021 1017   CHOLHDL 2.4 04/01/2021 1017   CHOLHDL 2.2 02/04/2017 0235   VLDL 14 02/04/2017 0235   LDLCALC 44 04/01/2021 1017    Physical Exam:    VS:  BP 124/76    Pulse 78    Ht 5' (1.524 m)    Wt 153 lb (69.4 kg)    SpO2 94%    BMI 29.88 kg/m     Wt Readings from Last 3 Encounters:  12/16/21 153 lb (69.4 kg)  04/29/21 160 lb (72.6 kg)  04/01/21 160 lb 1.3 oz (72.6  kg)     GEN: Patient is in no acute distress HEENT: Normal NECK: No JVD; No carotid bruits LYMPHATICS: No  lymphadenopathy CARDIAC: Hear sounds regular, 2/6 systolic murmur at the apex. RESPIRATORY:  Clear to auscultation without rales, wheezing or rhonchi  ABDOMEN: Soft, non-tender, non-distended MUSCULOSKELETAL:  No edema; No deformity  SKIN: Warm and dry NEUROLOGIC:  Alert and oriented x 3 PSYCHIATRIC:  Normal affect   Signed, Jenean Lindau, MD  12/16/2021 1:46 PM    [facility]

## 2022-06-05 ENCOUNTER — Emergency Department (HOSPITAL_BASED_OUTPATIENT_CLINIC_OR_DEPARTMENT_OTHER): Payer: Medicare Other

## 2022-06-05 ENCOUNTER — Encounter (HOSPITAL_BASED_OUTPATIENT_CLINIC_OR_DEPARTMENT_OTHER): Payer: Self-pay | Admitting: Urology

## 2022-06-05 ENCOUNTER — Emergency Department (HOSPITAL_BASED_OUTPATIENT_CLINIC_OR_DEPARTMENT_OTHER)
Admission: EM | Admit: 2022-06-05 | Discharge: 2022-06-05 | Disposition: A | Discharge status: Home / Self Care | Payer: Medicare Other | Attending: Emergency Medicine | Admitting: Emergency Medicine

## 2022-06-05 DIAGNOSIS — Z7982 Long term (current) use of aspirin: Secondary | ICD-10-CM | POA: Diagnosis not present

## 2022-06-05 DIAGNOSIS — S065X0A Traumatic subdural hemorrhage without loss of consciousness, initial encounter: Secondary | ICD-10-CM | POA: Insufficient documentation

## 2022-06-05 DIAGNOSIS — I10 Essential (primary) hypertension: Secondary | ICD-10-CM | POA: Insufficient documentation

## 2022-06-05 DIAGNOSIS — Z7984 Long term (current) use of oral hypoglycemic drugs: Secondary | ICD-10-CM | POA: Diagnosis not present

## 2022-06-05 DIAGNOSIS — F0390 Unspecified dementia without behavioral disturbance: Secondary | ICD-10-CM | POA: Insufficient documentation

## 2022-06-05 DIAGNOSIS — S0990XA Unspecified injury of head, initial encounter: Secondary | ICD-10-CM | POA: Diagnosis present

## 2022-06-05 DIAGNOSIS — Z79899 Other long term (current) drug therapy: Secondary | ICD-10-CM | POA: Diagnosis not present

## 2022-06-05 DIAGNOSIS — S065XAA Traumatic subdural hemorrhage with loss of consciousness status unknown, initial encounter: Secondary | ICD-10-CM

## 2022-06-05 DIAGNOSIS — W19XXXA Unspecified fall, initial encounter: Secondary | ICD-10-CM | POA: Diagnosis not present

## 2022-06-05 DIAGNOSIS — E119 Type 2 diabetes mellitus without complications: Secondary | ICD-10-CM | POA: Insufficient documentation

## 2022-06-05 DIAGNOSIS — I2510 Atherosclerotic heart disease of native coronary artery without angina pectoris: Secondary | ICD-10-CM | POA: Insufficient documentation

## 2022-06-05 NOTE — ED Triage Notes (Signed)
Per EMS, repeated falls over past few weeks States worsening mental status, increased confusion over past 3 weeks, NP sent for CT due to confusion  Fall today to knees  No notable injuries, Denies hitting head, no LOC  VS wnl with EMS, no interventions in route

## 2022-06-05 NOTE — ED Notes (Signed)
Pt ambulatory with assistance to BR.  Pt ambulated well, usually used walker at home.  Pt back in bed resting comfortably awaiting results

## 2022-06-05 NOTE — ED Notes (Signed)
[facility] at bedside to transport pt to [facility].  Pt stable for discharge and transfer home

## 2022-06-05 NOTE — ED Notes (Signed)
Bee, RN at [facility] called for discharged report.  Instructed to stop baby asa per instructions and follow up with neurology. PT[facility] called to transport pt back to [facility].

## 2022-06-05 NOTE — Discharge Instructions (Signed)
Return for worsening headache, confusion.  Stop your baby aspirin.

## 2022-06-05 NOTE — ED Notes (Signed)
Social worker for [facility]) needed to leave.  States call with any questions [phone]

## 2022-06-05 NOTE — ED Provider Notes (Addendum)
[facility] Provider Note   CSN: [id number] Arrival date & time: 06/05/22  7322     History  Chief Complaint  Patient presents with   Altered Mental Status    Wendy Roth is a 88 y.o. female.  88 y/o female with H/O CAD, HTN, DM type 2, depression, Dementia brought in b ambulance from assisted living for fall. Pt states she were trying to pick paper from the floor and lost balance and landed on her back then fell backward hitting head on the carpeted floor. C/o headache, neck pain and lower back pain. Denies N/V, visual changes. Denies LOC. She denies chest pain, SOB, abdominal pain or urinary Sx. Pt is A&O x3. No acute visible distress. Pt accompanied by the social worker, history provided by the patient. Social worker added lately the patient has been frequently falling. She started PT last week.   Altered Mental Status Associated symptoms: headaches   Associated symptoms: no abdominal pain, no fever, no light-headedness, no nausea, no palpitations, no rash, no vomiting and no weakness        Home Medications Prior to Admission medications   Medication Sig Start Date End Date Taking? Authorizing Provider  acetaminophen (TYLENOL) 500 MG tablet Take 500 mg by mouth 3 (three) times daily. Morning, Noon, Evening (Scheduled)    [provider]  aspirin EC 81 MG EC tablet Take 1 tablet (81 mg total) by mouth daily. Swallow whole. 05/02/21   Lavina Hamman, MD  atorvastatin (LIPITOR) 10 MG tablet Take 10 mg by mouth at bedtime.  04/07/19   [provider]  buPROPion (WELLBUTRIN XL) 300 MG 24 hr tablet Take 300 mg by mouth every morning. 10/04/20   [provider]  clonazePAM (KLONOPIN) 0.5 MG tablet Take 0.5 mg by mouth at bedtime. 11/11/20   [provider]  diltiazem (CARDIZEM CD) 120 MG 24 hr capsule Take 1 capsule (120 mg total) by mouth daily. 02/05/17   Reyne Dumas, MD  donepezil (ARICEPT) 5 MG tablet Take 5  mg by mouth every evening.    [provider]  DULoxetine (CYMBALTA) 60 MG capsule Take 120 mg by mouth every morning. 10/04/20   [provider]  gabapentin (NEURONTIN) 300 MG capsule Take 300 mg by mouth 3 (three) times daily.    [provider]  GOODSENSE ANTACID 750 MG chewable tablet Chew 2 tablets by mouth every 6 (six) hours as needed for heartburn. 12/10/21   [provider]  HYDROcodone-acetaminophen (NORCO/VICODIN) 5-325 MG tablet Take 1-2 tablets by mouth every 4 (four) hours as needed for moderate pain (pain score 4-6). 08/26/19   Swinteck, Aaron Edelman, MD  hydroxypropyl methylcellulose / hypromellose (ISOPTO TEARS / GONIOVISC) 2.5 % ophthalmic solution Place 1 drop into both eyes 3 (three) times daily.     [provider]  hydrOXYzine (ATARAX/VISTARIL) 10 MG tablet Take 10 mg by mouth at bedtime. 11/08/20   [provider]  LACTAID 3000 units tablet Take 3,000 Units by mouth as needed for dry skin. 01/25/20   [provider]  latanoprost (XALATAN) 0.005 % ophthalmic solution Place 1 drop into both eyes at bedtime.    [provider]  levothyroxine (SYNTHROID, LEVOTHROID) 75 MCG tablet Take 75 mcg by mouth daily before breakfast.    [provider]  lisinopril (ZESTRIL) 20 MG tablet Take 20 mg by mouth daily.    [provider]  magnesium oxide (MAG-OX) 400 (241.3 Mg) MG tablet Take 1 tablet (  400 mg total) by mouth daily. 02/06/17   Reyne Dumas, MD  metoprolol succinate (TOPROL-XL) 50 MG 24 hr tablet Take 50 mg by mouth every evening. Take with or immediately following a meal.     [provider]  nitroGLYCERIN (NITROSTAT) 0.4 MG SL tablet Place 0.4 mg under the tongue every 5 (five) minutes as needed for chest pain.    [provider]  omeprazole (PRILOSEC) 40 MG capsule Take 40 mg by mouth daily.    [provider]  ondansetron (ZOFRAN) 4 MG tablet Take 1 tablet (4 mg total) by mouth  every 6 (six) hours as needed for nausea. 08/26/19   Swinteck, Aaron Edelman, MD  polyethylene glycol (MIRALAX / GLYCOLAX) 17 g packet Take 17 g by mouth daily as needed for mild constipation.     [provider]  primidone (MYSOLINE) 50 MG tablet Take 50 mg by mouth 4 (four) times daily.  07/07/18   [provider]  rOPINIRole (REQUIP) 0.5 MG tablet Take 0.5 mg by mouth daily. 11/09/20   [provider]  senna (SENOKOT) 8.6 MG tablet Take 1 tablet by mouth as needed for constipation.    [provider]  sertraline (ZOLOFT) 100 MG tablet Take 100 mg by mouth at bedtime.  09/24/18   [provider]  Vitamin D, Ergocalciferol, (DRISDOL) 1.25 MG (50000 UT) CAPS capsule Take 50,000 Units by mouth every 14 (fourteen) days. 06/30/19   [provider]      Allergies    Cortisone    Review of Systems   Review of Systems  Constitutional: Negative.  Negative for chills, fatigue and fever.  Respiratory:  Negative for cough, chest tightness and shortness of breath.   Cardiovascular: Negative.  Negative for chest pain, palpitations and leg swelling.  Gastrointestinal:  Negative for abdominal pain, diarrhea, nausea and vomiting.  Musculoskeletal:  Positive for back pain, neck pain and neck stiffness.  Skin:  Negative for pallor, rash and wound.  Neurological:  Positive for headaches. Negative for dizziness, tremors, speech difficulty, weakness, light-headedness and numbness.    Physical Exam Updated Vital Signs BP (!) 160/72   Pulse 61   Resp 16   Ht 5' (1.524 m)   Wt 69.4 kg   SpO2 (!) 86%   BMI 29.88 kg/m  Physical Exam Vitals and nursing note reviewed.  Constitutional:      Appearance: Normal appearance.  HENT:     Head: Normocephalic.  Eyes:     General: No visual field deficit.    Extraocular Movements: Extraocular movements intact.     Conjunctiva/sclera: Conjunctivae normal.     Pupils: Pupils are equal, round, and reactive to light.   Cardiovascular:     Rate and Rhythm: Normal rate and regular rhythm.  Pulmonary:     Effort: Pulmonary effort is normal.     Breath sounds: Normal breath sounds.  Abdominal:     General: There is no distension.     Tenderness: There is no abdominal tenderness. There is no guarding.  Musculoskeletal:        General: Tenderness (diffuse midline TTP to LS and scaral spine. Mild TTp to paraspinous muscles BL) present.     Cervical back: Normal range of motion. Tenderness (Diffuse midline tenderness. Normal ROM in the neck) present.  Lymphadenopathy:     Cervical: No cervical adenopathy.  Skin:    General: Skin is warm.     Findings: No erythema or rash.  Neurological:  General: No focal deficit present.     Mental Status: She is alert and oriented to person, place, and time. Mental status is at baseline.     GCS: GCS eye subscore is 4. GCS verbal subscore is 5. GCS motor subscore is 6.     Cranial Nerves: Cranial nerves 2-12 are intact. No cranial nerve deficit, dysarthria or facial asymmetry.     Sensory: Sensation is intact. No sensory deficit.     Motor: No weakness or tremor.     Coordination: Coordination is intact.     Gait: Gait is intact.     ED Results / Procedures / Treatments   Labs (all labs ordered are listed, but only abnormal results are displayed) Labs Reviewed - No data to display  EKG None  Radiology CT Head Wo Contrast  Result Date: 06/05/2022 CLINICAL DATA:  Head trauma, minor (Age >= 65y); Neck trauma (Age >= 65y). Repeated falls, progressive confusion. EXAM: CT HEAD WITHOUT CONTRAST CT CERVICAL SPINE WITHOUT CONTRAST TECHNIQUE: Multidetector CT imaging of the head and cervical spine was performed following the standard protocol without intravenous contrast. Multiplanar CT image reconstructions of the cervical spine were also generated. RADIATION DOSE REDUCTION: This exam was performed according to the departmental dose-optimization program which includes  automated exposure control, adjustment of the mA and/or kV according to patient size and/or use of iterative reconstruction technique. COMPARISON:  None Available. FINDINGS: CT HEAD FINDINGS Brain: There are low attenuation subdural collections over the right cerebral convexity measuring up to 15 mm thick and superficial to the left frontal lobe measuring up to 8 mm thick in keeping with chronic subdural hematoma. There is mild mass effect upon the subjacent cerebral hemispheres, right greater than left. There is 2 mm right to left midline shift. Ventricular size is normal. Basal cisterns have been preserved. No acute infarct. No abnormal intra or extra-axial mass lesion. Mild periventricular white matter changes are present likely reflecting the sequela of small vessel ischemia. Cerebellum is unremarkable. Vascular: No asymmetric hyperdense vasculature at the skull base. Skull: Normal. Negative for fracture or focal lesion. Sinuses/Orbits: Orbits are unremarkable. Paranasal sinuses are clear. Other: Mastoid air cells and middle ear cavities are clear. CT CERVICAL SPINE FINDINGS Alignment: 2 mm anterolisthesis C2-3 and C3-4 is likely degenerative in nature. Otherwise normal cervical alignment Skull base and vertebrae: Craniocervical alignment is normal. Atlantodental interval is not widened. No acute fracture of the cervical spine. There is ankylosis of the vertebral bodies and right facet joint of C5-6. 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 In combination with posterior disc osteophyte complexes at C3-C7, this results in central canal stenosis with flattening of the thecal sac, with a minimal canal diameter of 8-9 mm in AP dimension. No canal hematoma. No prevertebral soft tissue swelling or paraspinal fluid collections. Disc levels: There is diffuse intervertebral disc space narrowing and endplate remodeling throughout the cervical spine in keeping with changes of  severe degenerative disc disease. Prevertebral soft tissues are not thickened on sagittal reformats. Multilevel uncovertebral and facet arthrosis results in multilevel moderate to severe neuroforaminal narrowing, most severe at bilaterally at C3-4 and C4-5, and on the right at C5-6 and C6-7. Upper chest: Negative. Other: None IMPRESSION: 1. Chronic bilateral subdural hematomas with mild mass effect upon the subjacent cerebral hemispheres and 2 mm right to left midline shift. 2. No acute fracture or listhesis of the cervical spine. 3. Advanced multilevel degenerative disc and degenerative joint disease resulting in  multilevel central canal and neuroforaminal stenosis as described above. 4. These results were called by telephone at the time of interpretation on 06/05/2022 at 6:36 pm to provider DAN FLOYD , who verbally acknowledged these results. Electronically Signed   By: Fidela Salisbury M.D.   On: 06/05/2022 18:36   CT Cervical Spine Wo Contrast  Result Date: 06/05/2022 CLINICAL DATA:  Head trauma, minor (Age >= 65y); Neck trauma (Age >= 65y). Repeated falls, progressive confusion. EXAM: CT HEAD WITHOUT CONTRAST CT CERVICAL SPINE WITHOUT CONTRAST TECHNIQUE: Multidetector CT imaging of the head and cervical spine was performed following the standard protocol without intravenous contrast. Multiplanar CT image reconstructions of the cervical spine were also generated. RADIATION DOSE REDUCTION: This exam was performed according to the departmental dose-optimization program which includes automated exposure control, adjustment of the mA and/or kV according to patient size and/or use of iterative reconstruction technique. COMPARISON:  None Available. FINDINGS: CT HEAD FINDINGS Brain: There are low attenuation subdural collections over the right cerebral convexity measuring up to 15 mm thick and superficial to the left frontal lobe measuring up to 8 mm thick in keeping with chronic subdural hematoma. There is mild mass  effect upon the subjacent cerebral hemispheres, right greater than left. There is 2 mm right to left midline shift. Ventricular size is normal. Basal cisterns have been preserved. No acute infarct. No abnormal intra or extra-axial mass lesion. Mild periventricular white matter changes are present likely reflecting the sequela of small vessel ischemia. Cerebellum is unremarkable. Vascular: No asymmetric hyperdense vasculature at the skull base. Skull: Normal. Negative for fracture or focal lesion. Sinuses/Orbits: Orbits are unremarkable. Paranasal sinuses are clear. Other: Mastoid air cells and middle ear cavities are clear. CT CERVICAL SPINE FINDINGS Alignment: 2 mm anterolisthesis C2-3 and C3-4 is likely degenerative in nature. Otherwise normal cervical alignment Skull base and vertebrae: Craniocervical alignment is normal. Atlantodental interval is not widened. No acute fracture of the cervical spine. There is ankylosis of the vertebral bodies and right facet joint of C5-6. 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 In combination with posterior disc osteophyte complexes at C3-C7, this results in central canal stenosis with flattening of the thecal sac, with a minimal canal diameter of 8-9 mm in AP dimension. No canal hematoma. No prevertebral soft tissue swelling or paraspinal fluid collections. Disc levels: There is diffuse intervertebral disc space narrowing and endplate remodeling throughout the cervical spine in keeping with changes of severe degenerative disc disease. Prevertebral soft tissues are not thickened on sagittal reformats. Multilevel uncovertebral and facet arthrosis results in multilevel moderate to severe neuroforaminal narrowing, most severe at bilaterally at C3-4 and C4-5, and on the right at C5-6 and C6-7. Upper chest: Negative. Other: None IMPRESSION: 1. Chronic bilateral subdural hematomas with mild mass effect upon the subjacent cerebral hemispheres  and 2 mm right to left midline shift. 2. No acute fracture or listhesis of the cervical spine. 3. Advanced multilevel degenerative disc and degenerative joint disease resulting in multilevel central canal and neuroforaminal stenosis as described above. 4. These results were called by telephone at the time of interpretation on 06/05/2022 at 6:36 pm to provider DAN FLOYD , who verbally acknowledged these results. Electronically Signed   By: Fidela Salisbury M.D.   On: 06/05/2022 18:36   DG Sacrum/Coccyx  Result Date: 06/05/2022 CLINICAL DATA:  Back pain after fall. EXAM: SACRUM AND COCCYX - 2+ VIEW COMPARISON:  None Available. FINDINGS: There is no evidence of fracture or  other focal bone lesions. IMPRESSION: Negative. Electronically Signed   By: Marijo Conception M.D.   On: 06/05/2022 18:13   DG Pelvis 1-2 Views  Result Date: 06/05/2022 CLINICAL DATA:  Fall. EXAM: PELVIS - 1-2 VIEW COMPARISON:  None Available. FINDINGS: There is no evidence of pelvic fracture or diastasis. No pelvic bone lesions are seen. IMPRESSION: Negative. Electronically Signed   By: Marijo Conception M.D.   On: 06/05/2022 18:12   DG Lumbar Spine Complete  Result Date: 06/05/2022 CLINICAL DATA:  Lower back pain after fall. EXAM: LUMBAR SPINE - COMPLETE 4+ VIEW COMPARISON:  December 17, 2017. FINDINGS: Mild grade 1 retrolisthesis of L3-4 is noted secondary to severe degenerative disc disease at this level. Mild grade 1 anterolisthesis is noted at L5-S1 and S1-2 secondary to posterior facet joint hypertrophy. Severe degenerative disc disease is noted at L1-2, L2-3, L3 L4, L5-S1 and S1-2. No definite fracture is noted. IMPRESSION: Severe multilevel degenerative changes are noted. No acute abnormality seen. Aortic Atherosclerosis (ICD10-I70.0). Electronically Signed   By: Marijo Conception M.D.   On: 06/05/2022 18:11    Procedures Procedures    Medications Ordered in ED Medications - No data to display  ED Course/ Medical Decision Making/  A&P                           Medical Decision Making 88 y/o female transported from assisted living via ambulance for evaluation for frequent falls. Pt states she were trying to pick paper from the floor and lost balance fell back ward lander on her back then hit back of the head on the carpeted floor. C/O headache, neck and lower back pain. No LOC Diffuse mid line c spine and L spine TTP. Mild occipital TTP without bruising, or hematoma.  Pt comfortably communicating without acute distress. No focal neurological deficits. No cognitive impairment.  Denies CP, SOB  No facial asymmetry. Speech clear. Strength equal BL Upper and lower extremities. Motor and sensory function Intact. After the evaluation I strongly suspect the fall is related to loss of balance. I do not suspect cardiac or neurological cause of pt's fall.   We will do imaging: CT head, C spine and XR LS and sacral spine CT head: Chronic bilateral subdural hematomas with mild mass effect upon the subjacent cerebral hemispheres and 2 mm right to left midline shift. Findings of CT head and other imaging studies results were discussed with the patient. Pt was informed of the subdural hematoma likely the cause of her frequent falls and or one the the fall must have resulted in SDH.  Dr Tyrone Nine called and spoke with Dr Kathyrn Sheriff about pt's findings and diagnosis. Dr Kathyrn Sheriff will see the patient in his office. Pt will stop Aspirin and will call the neurologist office tomorrow to schedule an appointment. Instructions for scheduling the appointment with the neurologist were placed in the AVS.   Patient in no distress. Detailed discussions were had with the patient regarding current findings, and need for close f/u with PCP or on call doctor. The patient has been instructed to return immediately if the symptoms worsen in any way for re-evaluation. Patient verbalized understanding and is in agreement with current care plan. All questions  answered prior to discharge.       Amount and/or Complexity of Data Reviewed Radiology: ordered.          Final Clinical Impression(s) / ED Diagnoses Final diagnoses:  SDH (subdural  hematoma) [facility])    Rx / DC Orders ED Discharge Orders     None         Teola Bradley, MD 06/05/22 2016    Teola Bradley, MD 06/05/22 2045    Deno Etienne, DO 06/05/22 2351

## 2023-12-17 ENCOUNTER — Other Ambulatory Visit: Payer: Self-pay | Admitting: Internal Medicine

## 2023-12-17 DIAGNOSIS — Z1231 Encounter for screening mammogram for malignant neoplasm of breast: Secondary | ICD-10-CM

## 2024-01-08 ENCOUNTER — Ambulatory Visit
Admission: RE | Admit: 2024-01-08 | Discharge: 2024-01-08 | Disposition: A | Discharge status: Home / Self Care | Source: Ambulatory Visit | Attending: Internal Medicine | Admitting: Internal Medicine

## 2024-01-08 DIAGNOSIS — Z1231 Encounter for screening mammogram for malignant neoplasm of breast: Secondary | ICD-10-CM

## 2024-01-08 HISTORY — DX: Personal history of irradiation: Z92.3

## 2024-04-22 ENCOUNTER — Encounter: Payer: Self-pay | Admitting: Advanced Practice Midwife

## 2024-11-06 DEATH — deceased
